# Patient Record
Sex: Female | Born: 1935 | Race: Black or African American | Hispanic: No | Marital: Single | Smoking: Former smoker
Health system: Southern US, Community
[De-identification: ages and names within clinical notes are randomized; demographics above are authoritative.]

## PROBLEM LIST (undated history)

## (undated) DIAGNOSIS — Z531 Procedure and treatment not carried out because of patient's decision for reasons of belief and group pressure: Secondary | ICD-10-CM

## (undated) DIAGNOSIS — E785 Hyperlipidemia, unspecified: Secondary | ICD-10-CM

## (undated) DIAGNOSIS — M1990 Unspecified osteoarthritis, unspecified site: Secondary | ICD-10-CM

## (undated) DIAGNOSIS — Z8719 Personal history of other diseases of the digestive system: Secondary | ICD-10-CM

## (undated) DIAGNOSIS — I639 Cerebral infarction, unspecified: Secondary | ICD-10-CM

## (undated) DIAGNOSIS — IMO0001 Reserved for inherently not codable concepts without codable children: Secondary | ICD-10-CM

## (undated) DIAGNOSIS — K219 Gastro-esophageal reflux disease without esophagitis: Secondary | ICD-10-CM

## (undated) DIAGNOSIS — H409 Unspecified glaucoma: Secondary | ICD-10-CM

## (undated) DIAGNOSIS — I10 Essential (primary) hypertension: Secondary | ICD-10-CM

## (undated) DIAGNOSIS — E119 Type 2 diabetes mellitus without complications: Secondary | ICD-10-CM

## (undated) DIAGNOSIS — Z87442 Personal history of urinary calculi: Secondary | ICD-10-CM

## (undated) DIAGNOSIS — H35039 Hypertensive retinopathy, unspecified eye: Secondary | ICD-10-CM

## (undated) DIAGNOSIS — Z8739 Personal history of other diseases of the musculoskeletal system and connective tissue: Secondary | ICD-10-CM

## (undated) DIAGNOSIS — E11319 Type 2 diabetes mellitus with unspecified diabetic retinopathy without macular edema: Secondary | ICD-10-CM

## (undated) HISTORY — PX: FRACTURE SURGERY: SHX138

## (undated) HISTORY — PX: CATARACT EXTRACTION: SUR2

## (undated) HISTORY — DX: Hypertensive retinopathy, unspecified eye: H35.039

## (undated) HISTORY — PX: UMBILICAL HERNIA REPAIR: SHX196

## (undated) HISTORY — PX: PATELLA FRACTURE SURGERY: SHX735

## (undated) HISTORY — PX: EYE SURGERY: SHX253

## (undated) HISTORY — DX: Unspecified glaucoma: H40.9

## (undated) HISTORY — PX: EXCISIONAL HEMORRHOIDECTOMY: SHX1541

## (undated) HISTORY — PX: HERNIA REPAIR: SHX51

## (undated) HISTORY — PX: CYSTOSCOPY W/ STONE MANIPULATION: SHX1427

## (undated) HISTORY — PX: CHOLECYSTECTOMY: SHX55

## (undated) HISTORY — DX: Type 2 diabetes mellitus with unspecified diabetic retinopathy without macular edema: E11.319

---

## 2006-06-24 IMAGING — CR DG CHEST 2V
2 series · 2 of 2 positions shown · non-contrast
Comparison: none

CLINICAL DATA: Preop ventral hernia repair.  
 CHEST - 2 VIEW:

[view not recorded (1 of 2)]
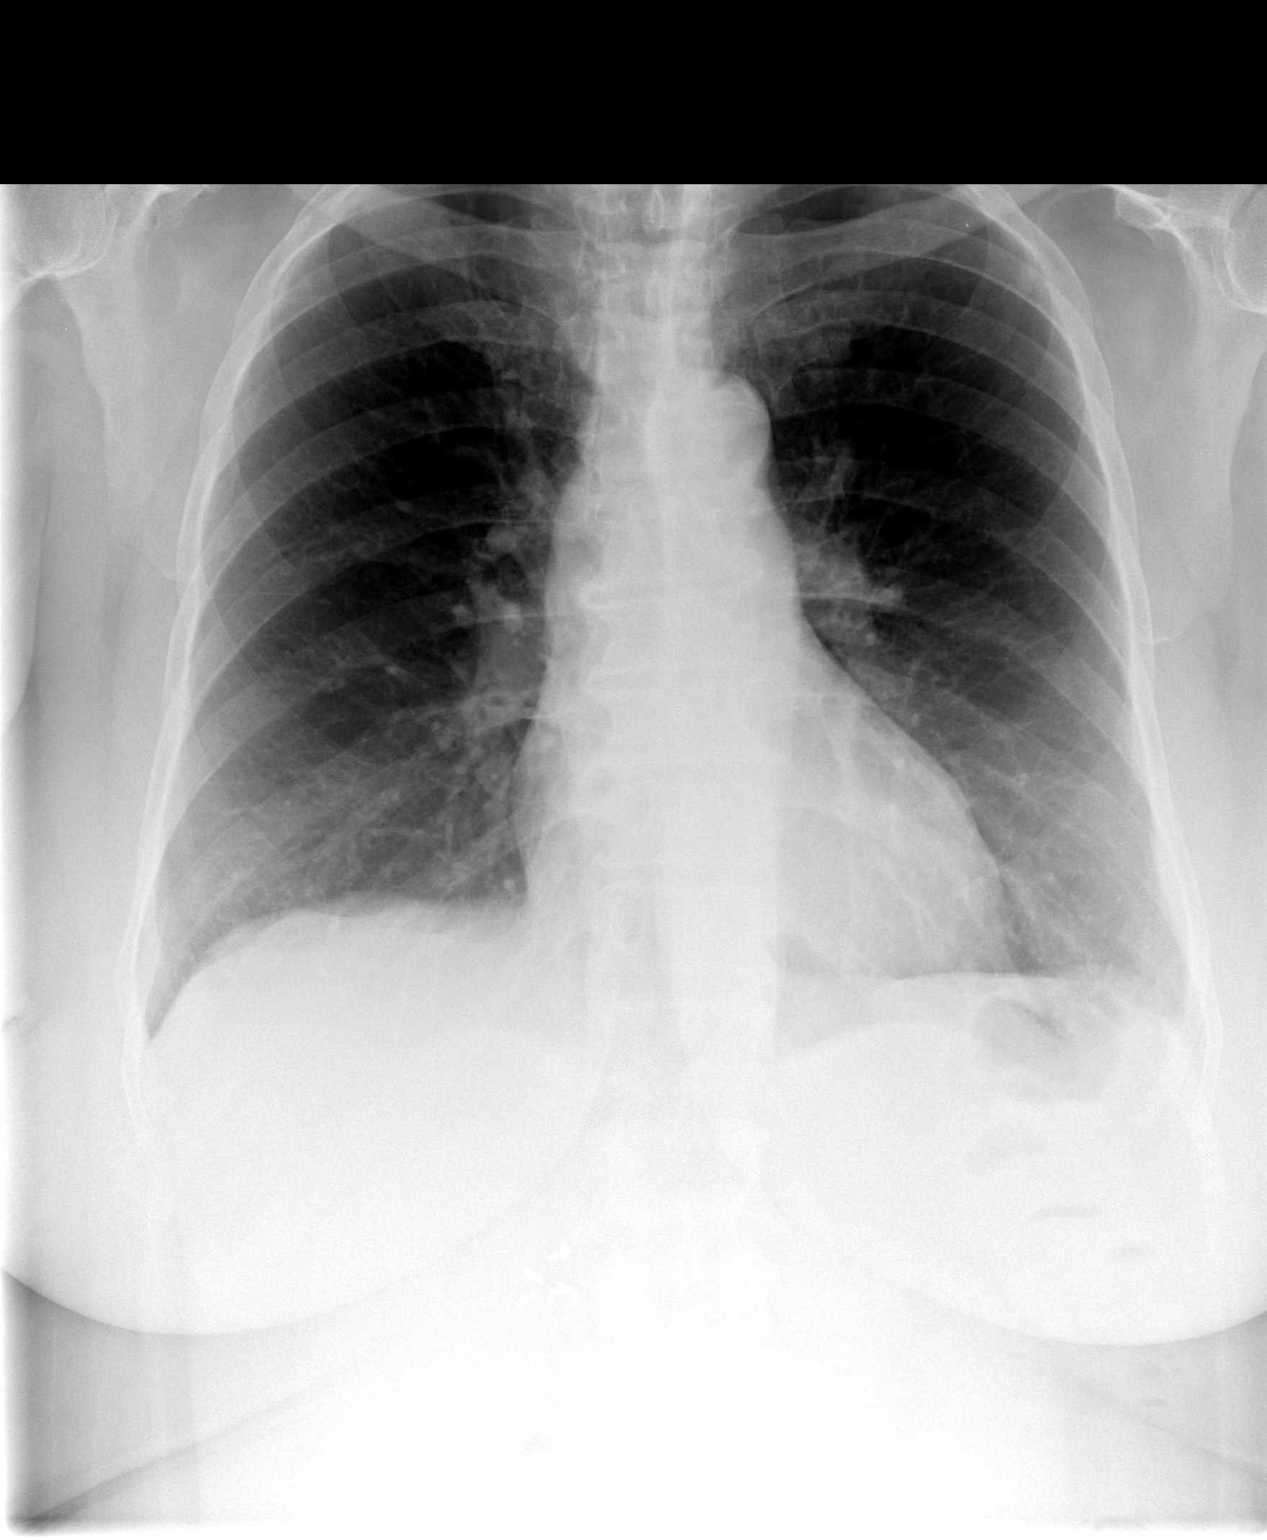

[view not recorded (2 of 2)]
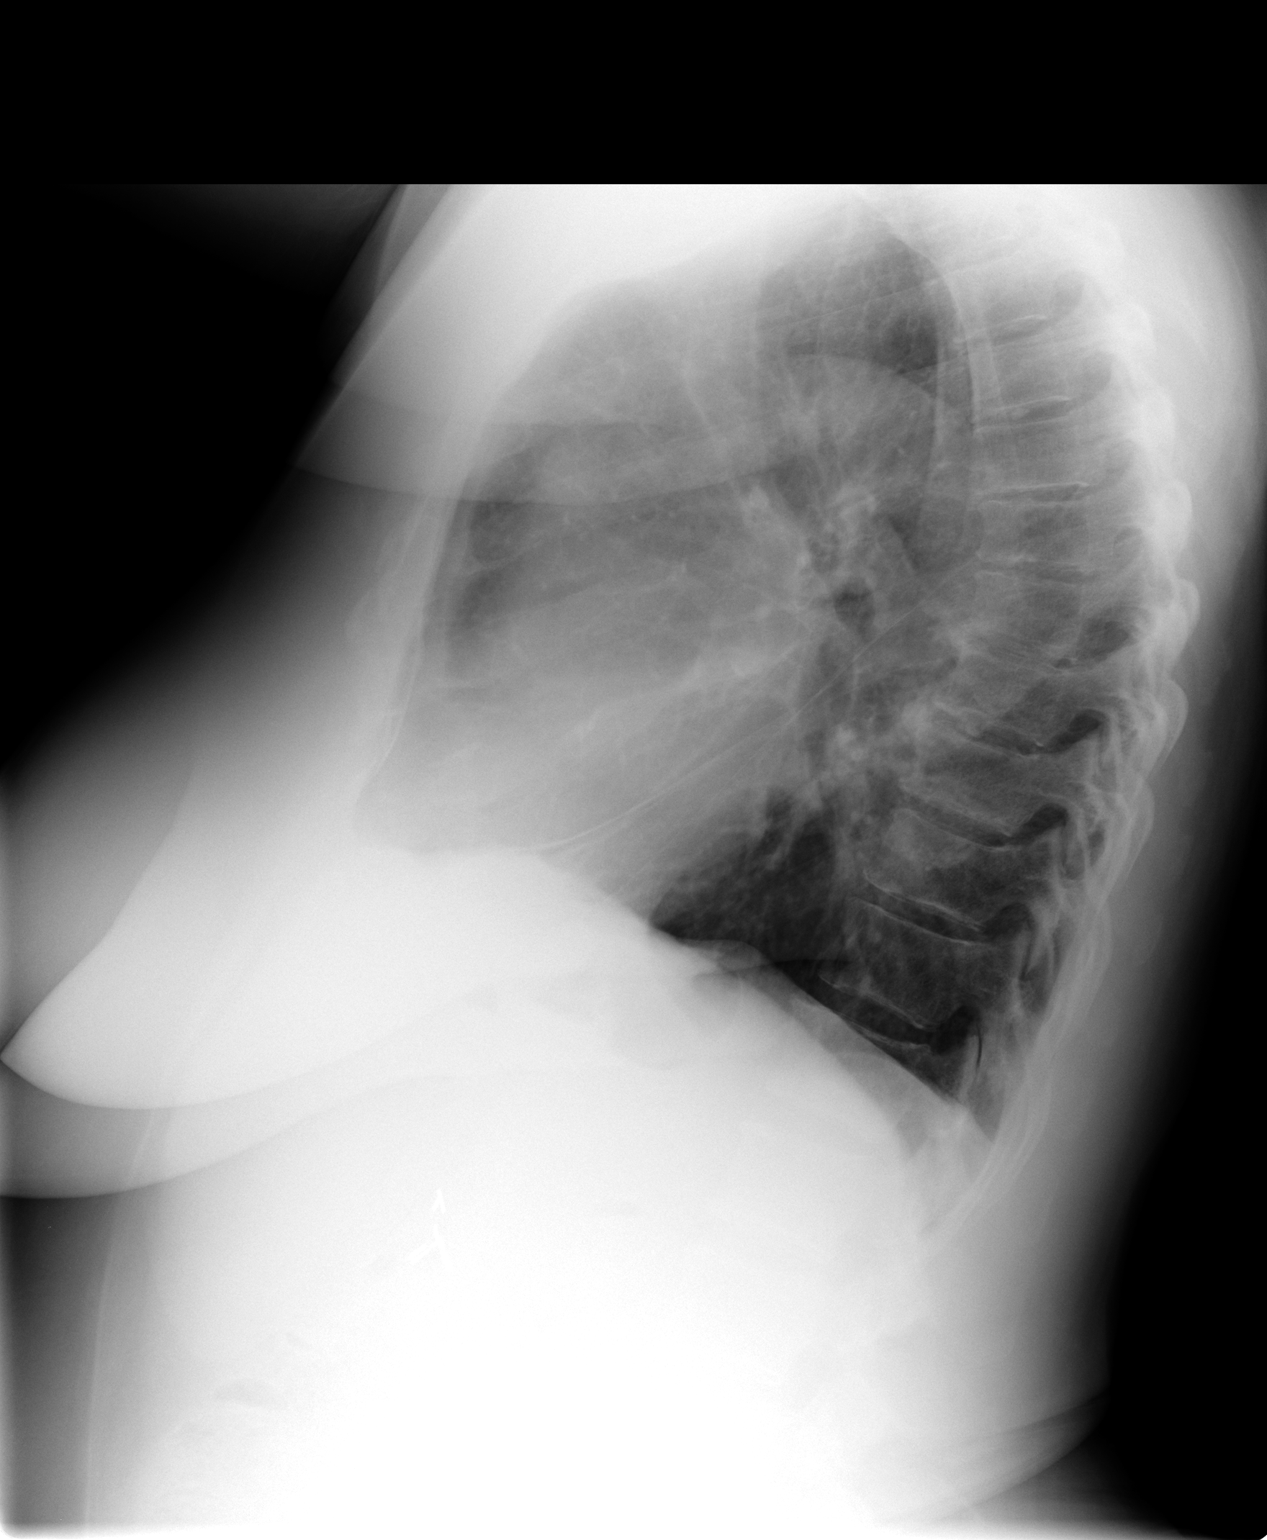

[2 of 2 positions shown; findings below may reference images not displayed]

FINDINGS: The lungs are clear.  The heart and mediastinal structures are normal.
IMPRESSION: No evidence for active chest disease.

## 2006-06-27 ENCOUNTER — Inpatient Hospital Stay (HOSPITAL_COMMUNITY): Admission: RE | Admit: 2006-06-27 | Discharge: 2006-06-30 | Payer: Self-pay | Admitting: General Surgery

## 2006-06-28 IMAGING — CR DG CHEST 2V
2 series · 2 of 2 positions shown · non-contrast
Comparison: none

CLINICAL DATA: Cough and fever

Chest 2 view:
Comparison [DATE]. Heart size upper limits normal. Lungs clear. Vascular
clips in the right upper abdomen. No effusion. Visualized bones unremarkable.

[w chest pa]
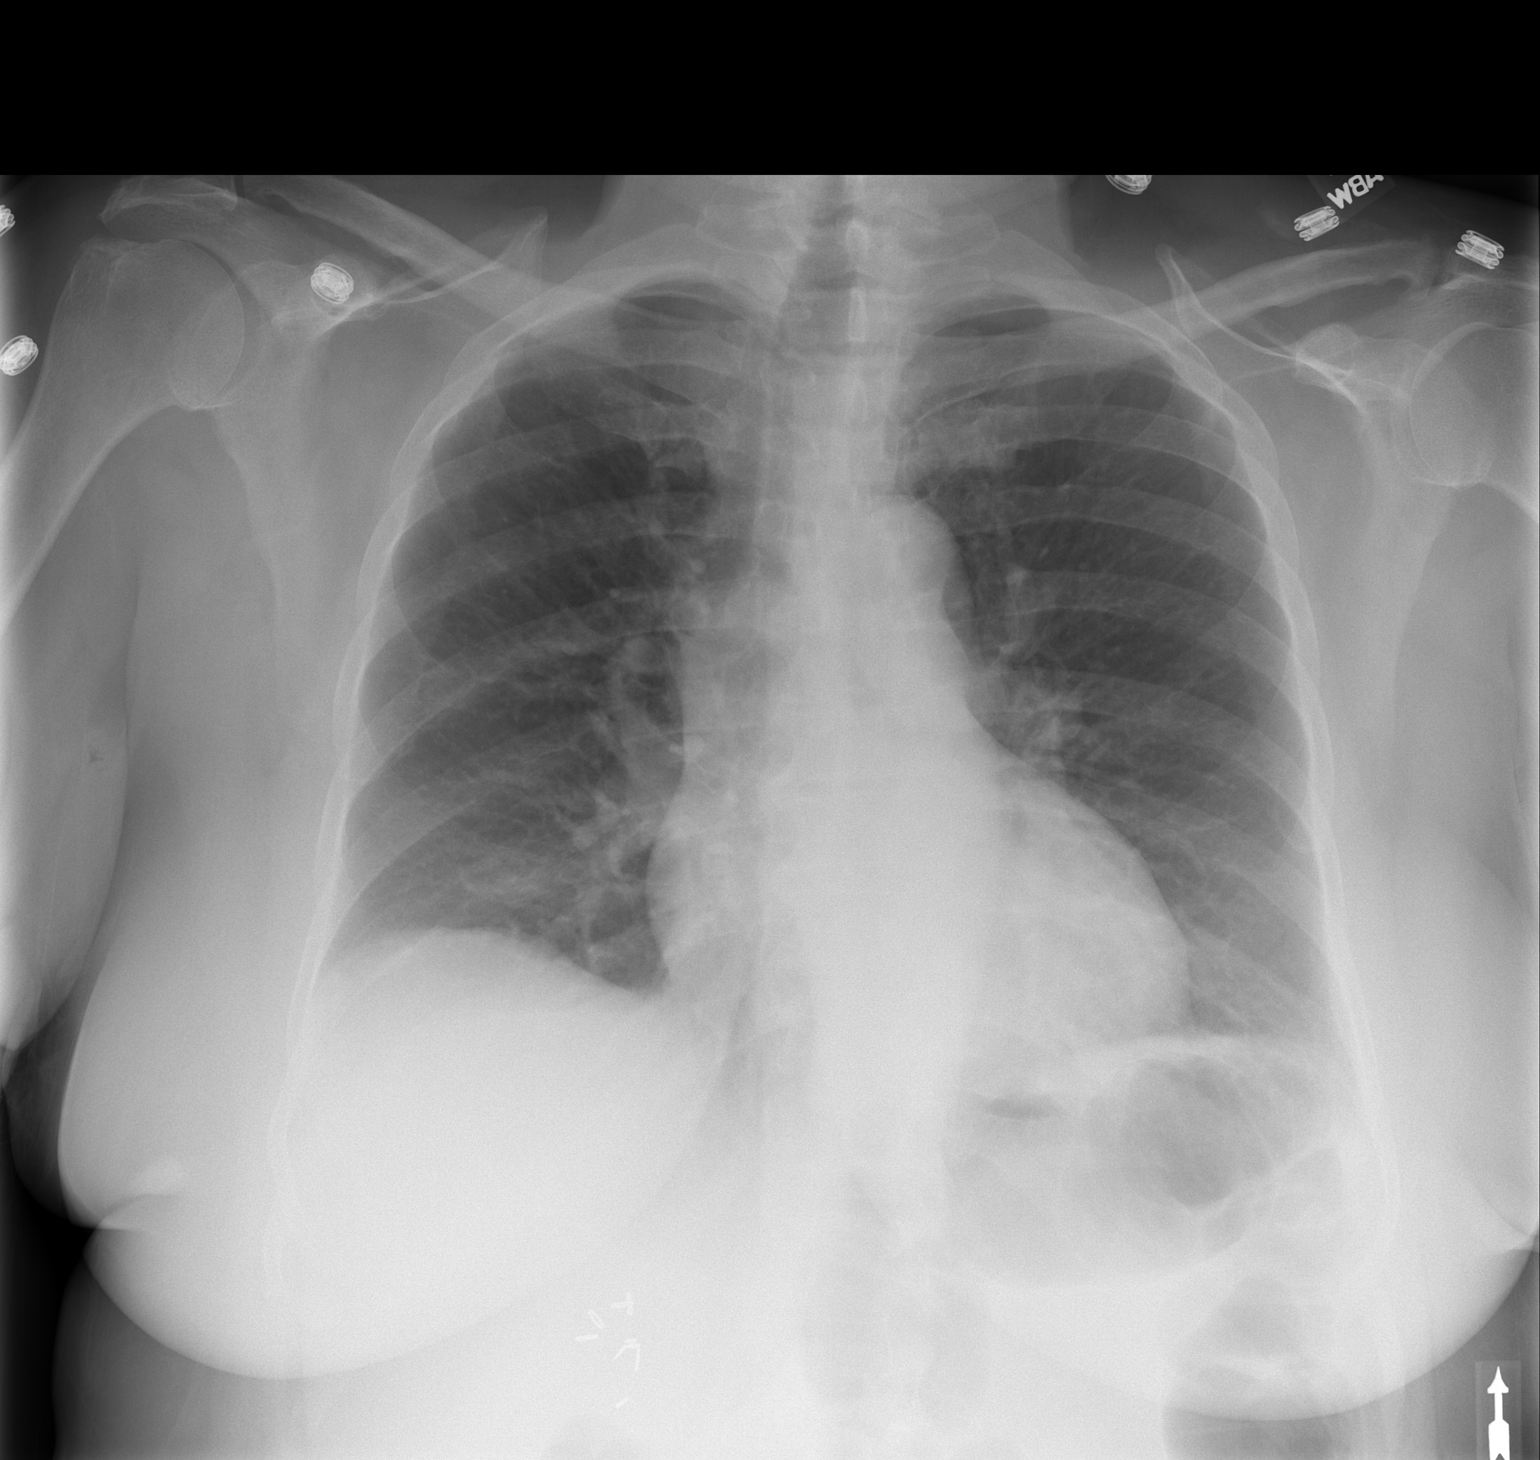

[w chest lat]
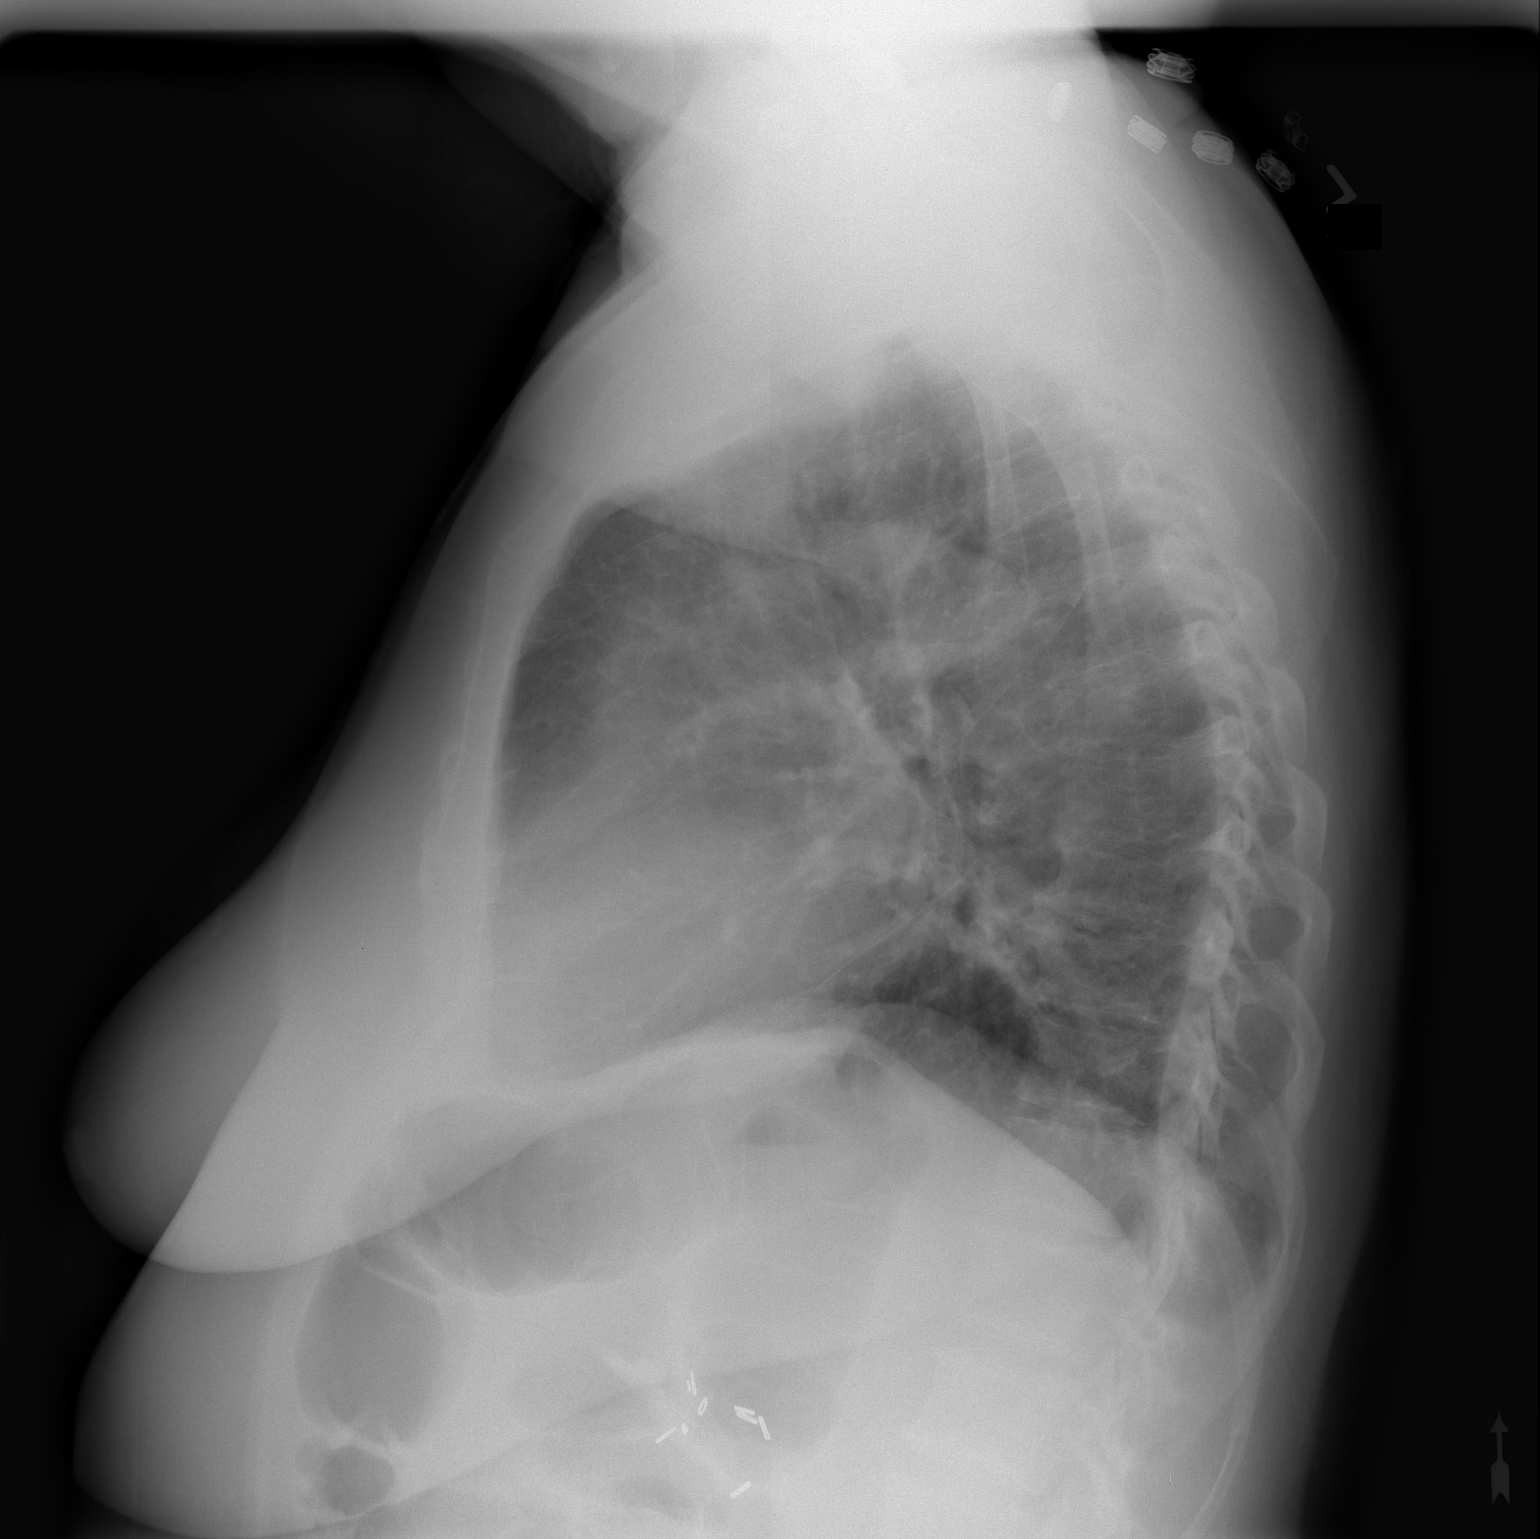

[2 of 2 positions shown; findings below may reference images not displayed]

IMPRESSION: 1. No acute disease

## 2012-02-25 ENCOUNTER — Other Ambulatory Visit: Payer: Self-pay | Admitting: Internal Medicine

## 2012-02-27 ENCOUNTER — Ambulatory Visit (INDEPENDENT_AMBULATORY_CARE_PROVIDER_SITE_OTHER): Payer: BC Managed Care – PPO | Admitting: Family Medicine

## 2012-02-27 VITALS — BP 122/75 | HR 69 | Temp 97.8°F | Resp 16 | Ht 62.25 in | Wt 195.6 lb

## 2012-02-27 DIAGNOSIS — I10 Essential (primary) hypertension: Secondary | ICD-10-CM

## 2012-02-27 DIAGNOSIS — E785 Hyperlipidemia, unspecified: Secondary | ICD-10-CM | POA: Insufficient documentation

## 2012-02-27 DIAGNOSIS — E119 Type 2 diabetes mellitus without complications: Secondary | ICD-10-CM | POA: Insufficient documentation

## 2012-02-27 DIAGNOSIS — M6580 Other synovitis and tenosynovitis, unspecified site: Secondary | ICD-10-CM

## 2012-02-27 DIAGNOSIS — M76892 Other specified enthesopathies of left lower limb, excluding foot: Secondary | ICD-10-CM

## 2012-02-27 MED ORDER — METHYLPREDNISOLONE 4 MG PO TABS: ORAL_TABLET | ORAL | Status: DC

## 2012-02-27 NOTE — Progress Notes (Signed)
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  Has been stiff, using cane at times. Blood sugars < 150  O:  NAD Skin warm and dry Tender medial joint line and soft tissue of left knee with FROM and no crepitus.  Mild effusion.  No lig laxity.  No calf tenderness or thigh tenderness  A: tendonitis  P:  Medrol 4 mg ii daily x 5 days

## 2012-03-14 ENCOUNTER — Ambulatory Visit (INDEPENDENT_AMBULATORY_CARE_PROVIDER_SITE_OTHER): Payer: BC Managed Care – PPO | Admitting: Family Medicine

## 2012-03-14 ENCOUNTER — Ambulatory Visit: Payer: BC Managed Care – PPO

## 2012-03-14 VITALS — BP 105/64 | HR 77 | Temp 98.5°F | Resp 16 | Ht 62.5 in | Wt 193.0 lb

## 2012-03-14 DIAGNOSIS — M25562 Pain in left knee: Secondary | ICD-10-CM

## 2012-03-14 DIAGNOSIS — M25569 Pain in unspecified knee: Secondary | ICD-10-CM

## 2012-03-14 DIAGNOSIS — M1710 Unilateral primary osteoarthritis, unspecified knee: Secondary | ICD-10-CM

## 2012-03-14 IMAGING — CR DG KNEE COMPLETE 4+V*L*
4 series · 4 of 4 positions shown · non-contrast
Comparison: None.

CLINICAL DATA: Pain especially with weightbearing

LEFT KNEE - COMPLETE 4+ VIEW

[lateral]
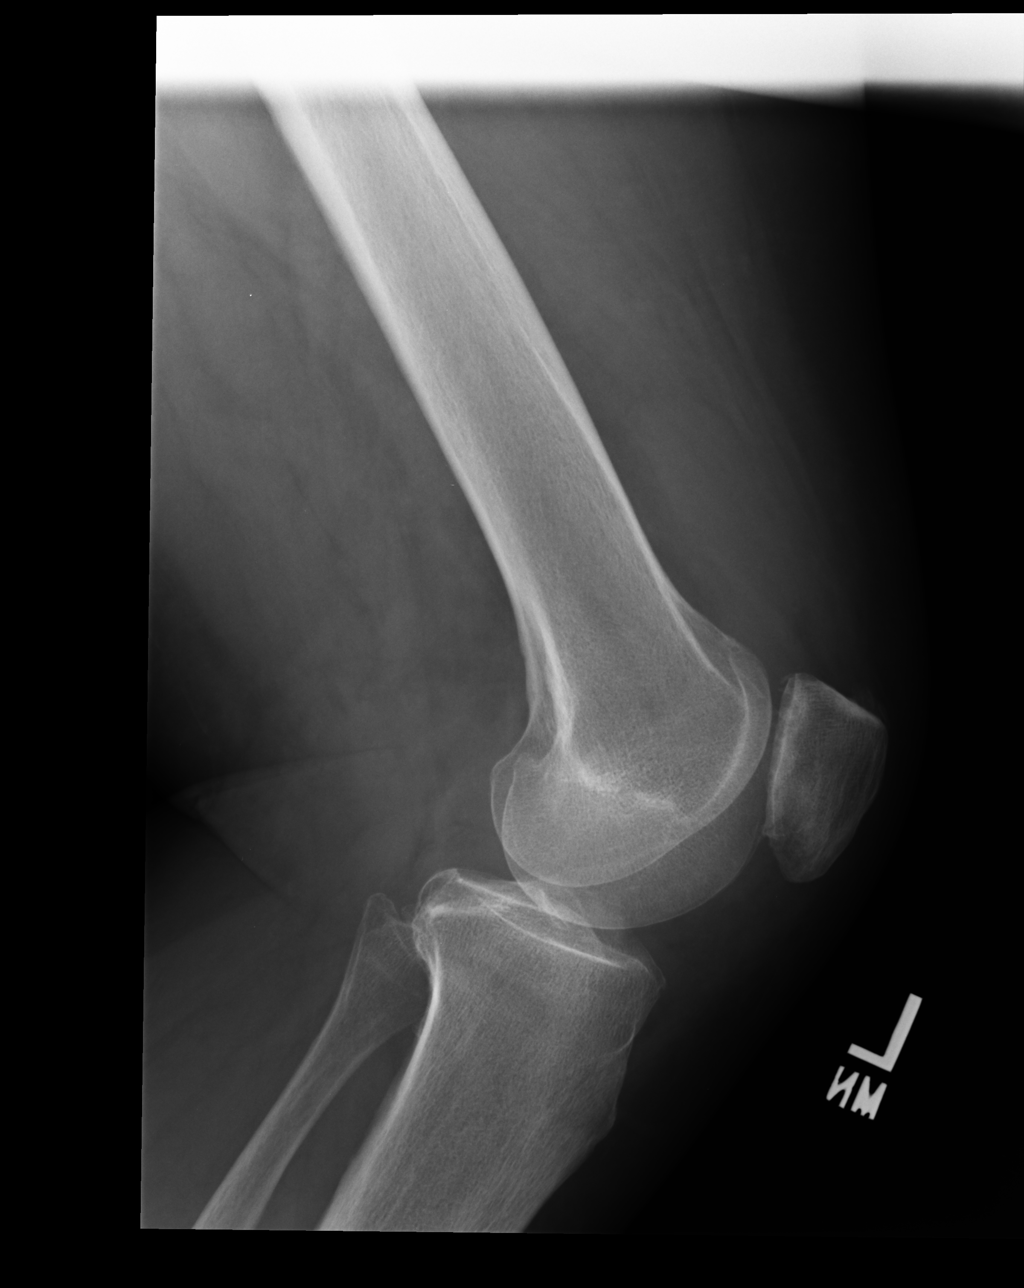

[pa axial]
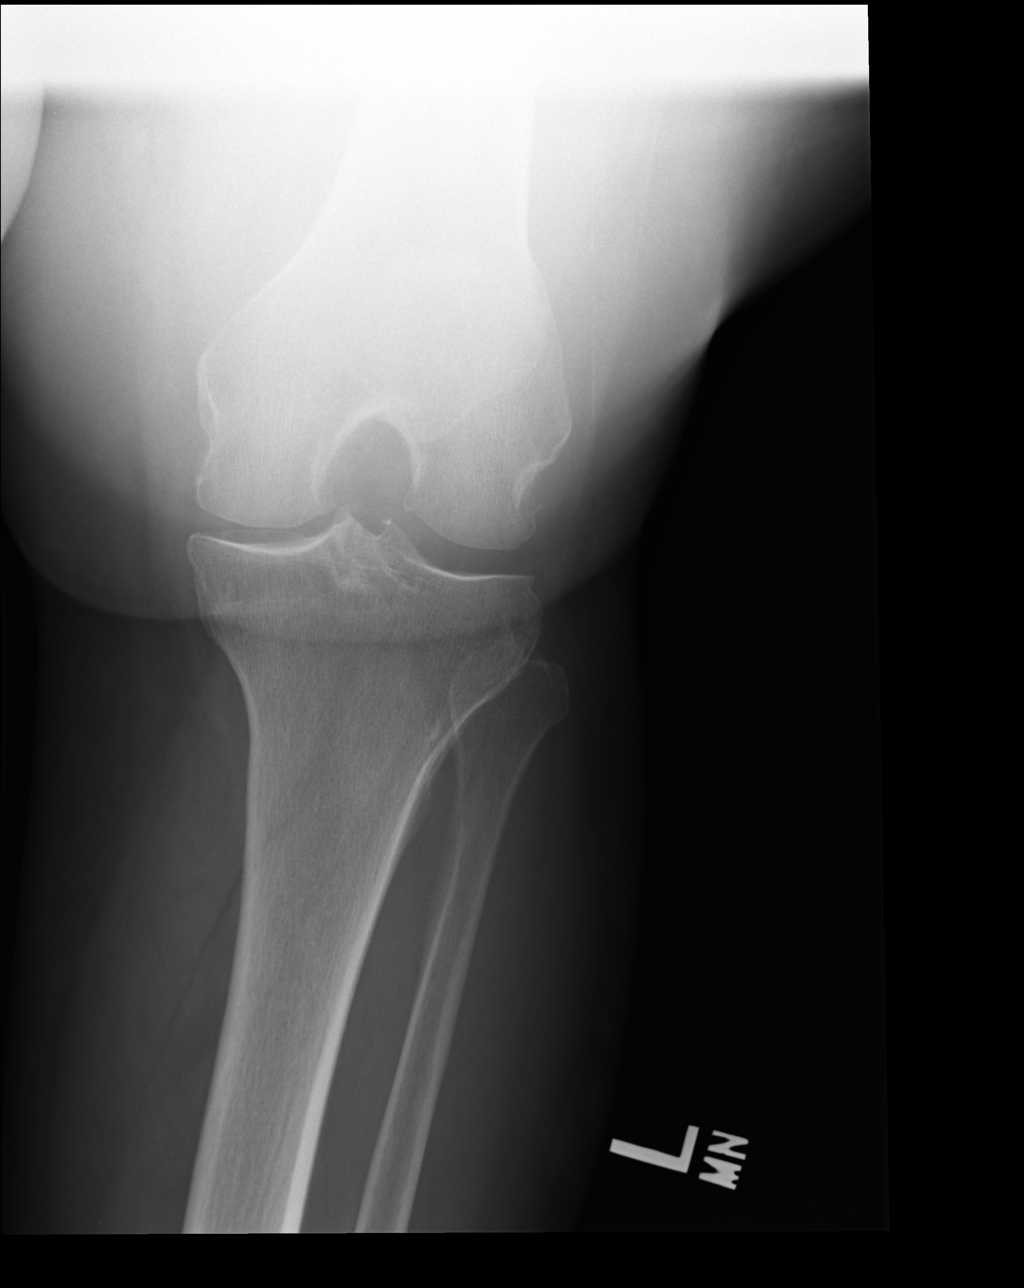

[sunrise]
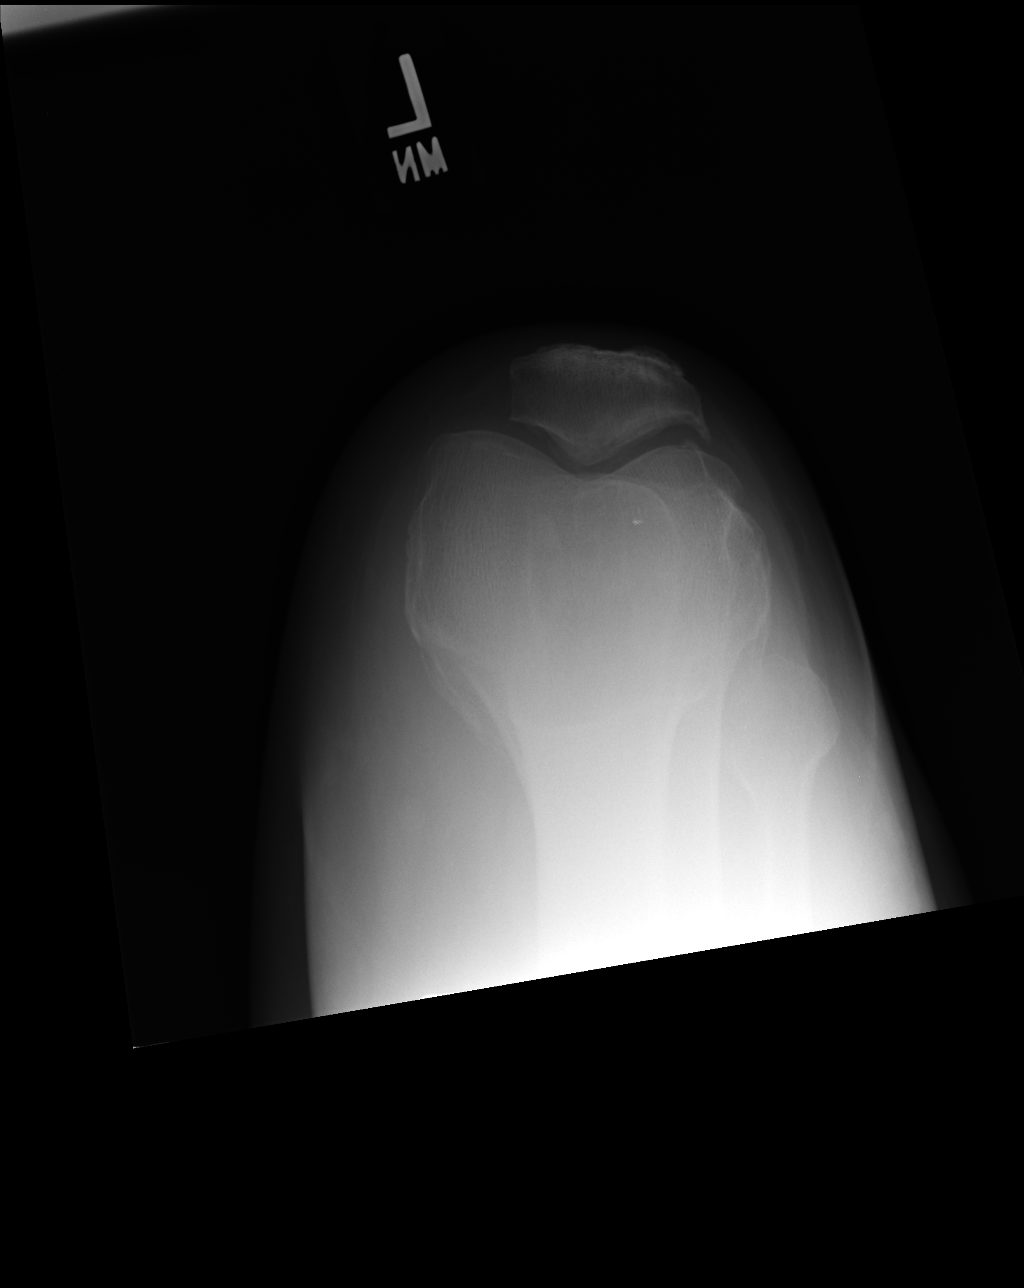

[AP]
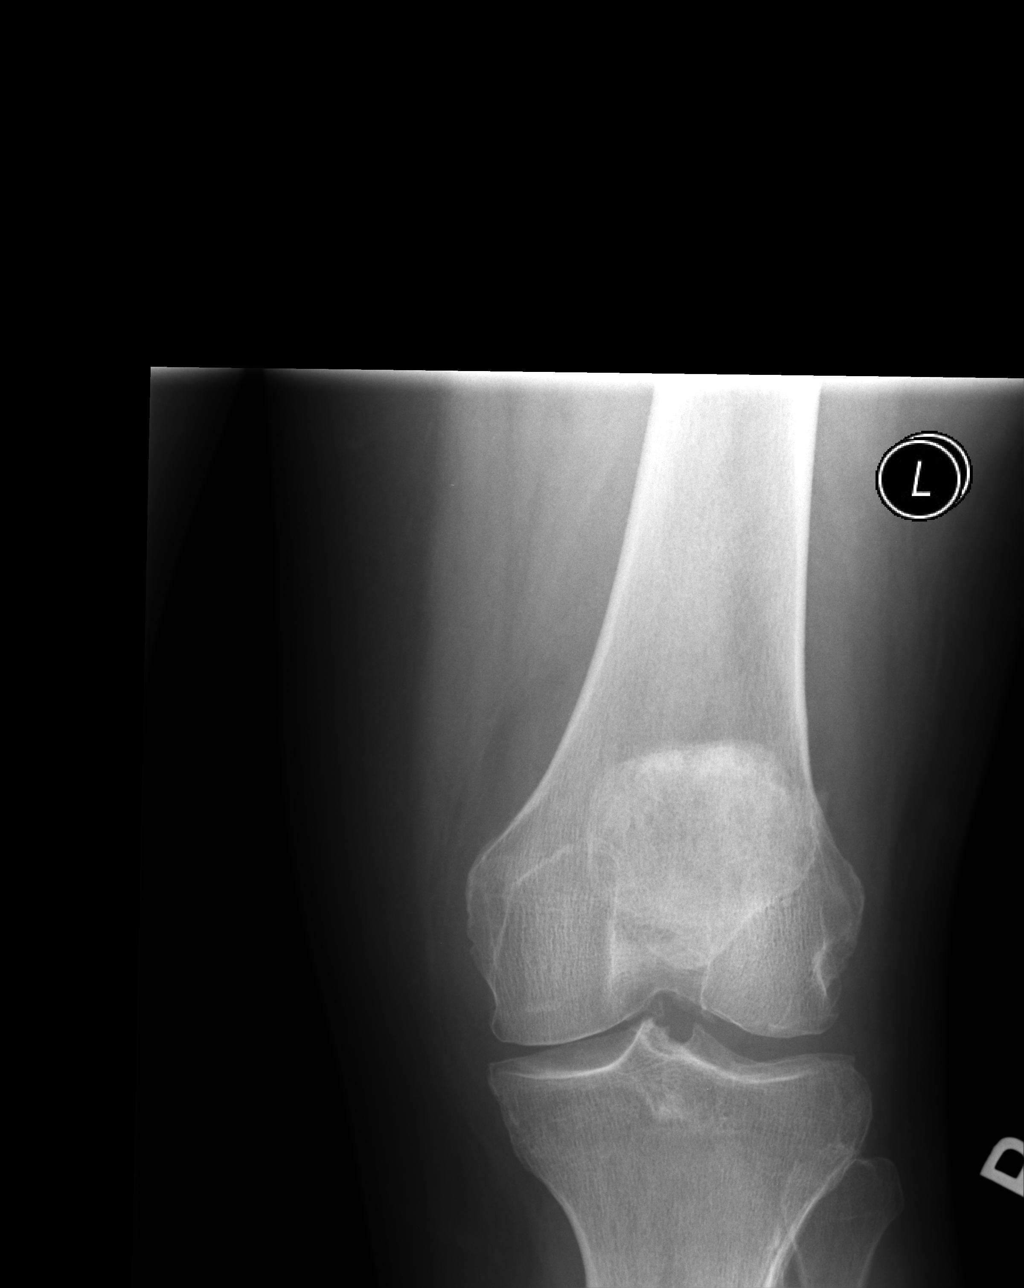

[4 of 4 positions shown; findings below may reference images not displayed]

FINDINGS: There is a small suprapatellar joint effusion.

There is moderate tricompartment osteoarthritis with sharpening
tibial spines, marginal spur formation and medial compartment
narrowing.

No fracture or subluxation.
IMPRESSION: 1.  Moderate tricompartment osteoarthritis.
2.  Joint effusion]

## 2012-03-14 MED ORDER — SITAGLIPTIN PHOS-METFORMIN HCL 50-1000 MG PO TABS: 1.0000 | ORAL_TABLET | Freq: Two times a day (BID) | ORAL | Status: DC

## 2012-03-14 MED ORDER — DICLOFENAC SODIUM 75 MG PO TBEC: 75.0000 mg | DELAYED_RELEASE_TABLET | Freq: Two times a day (BID) | ORAL | Status: AC

## 2012-03-14 NOTE — Progress Notes (Signed)
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 Has been stiff, using cane at times, and tried brace this week. Was seen 10 days ago and given medrol.  She continues to experience the medial left knee pain, especially when walking. Tried OTC NSAID which did help some.  Knee has started to give away at times.  O:  Still tender over medial femoral condyle.  No effustion.  Ligaments intact.  FROM. [facility] reading (PRIMARY) by  Dr. Milus Glazier:  L knee:  Mild arthritic changes  A:  Arthritis, mild with medial inflammation  P:  voltaren 75 mg bid x 10 days Recheck in 10 days

## 2012-03-14 NOTE — Patient Instructions (Addendum)
APPOINTMENT:  March 24, 2012 at 4 pm with Dr. Milus Glazier at [address].    Drinda Butts and Tear Disorders of the Knee (Arthritis, Osteoarthritis) Everyone will experience wear and tear injuries (arthritis, osteoarthritis) of the knee. These are the changes we all get as we age. They come from the joint stress of daily living. The amount of cartilage damage in your knee and your symptoms determine if you need surgery. Mild problems require approximately two months recovery time. More severe problems take several months to recover. With mild problems, your surgeon may find worn and rough cartilage surfaces. With severe changes, your surgeon may find cartilage that has completely worn away and exposed the bone. Loose bodies of bone and cartilage, bone spurs (excess bone growth), and injuries to the menisci (cushions between the large bones of your leg) are also common. All of these problems can cause pain. For a mild wear and tear problem, rough cartilage may simply need to be shaved and smoothed. For more severe problems with areas of exposed bone, your surgeon may use an instrument for roughing up the bone surfaces to stimulate new cartilage growth. Loose bodies are usually removed. Torn menisci may be trimmed or repaired. ABOUT THE ARTHROSCOPIC PROCEDURE Arthroscopy is a surgical technique. It allows your orthopedic surgeon to diagnose and treat your knee injury with accuracy. The surgeon looks into your knee through a small scope. The scope is like a small (pencil-sized) telescope. Arthroscopy is less invasive than open knee surgery. You can expect a more rapid recovery. After the procedure, you will be moved to a recovery area until most of the effects of the medication have worn off. Your caregiver will discuss the test results with you. RECOVERY The severity of the arthritis and the type of procedure performed will determine recovery time. Other important factors include age, physical condition, medical  conditions, and the type of rehabilitation program. Strengthening your muscles after arthroscopy helps guarantee a better recovery. Follow your caregiver's instructions. Use crutches, rest, elevate, ice, and do knee exercises as instructed. Your caregivers will help you and instruct you with exercises and other physical therapy required to regain your mobility, muscle strength, and functioning following surgery. Only take over-the-counter or prescription medicines for pain, discomfort, or fever as directed by your caregiver.  SEEK MEDICAL CARE IF:   There is increased bleeding (more than a small spot) from the wound.   You notice redness, swelling, or increasing pain in the wound.   Pus is coming from wound.   You develop an unexplained oral temperature above 102 F (38.9 C) , or as your caregiver suggests.   You notice a foul smell coming from the wound or dressing.   You have severe pain with motion of the knee.  SEEK IMMEDIATE MEDICAL CARE IF:   You develop a rash.   You have difficulty breathing.   You have any allergic problems.  MAKE SURE YOU:   Understand these instructions.   Will watch your condition.   Will get help right away if you are not doing well or get worse.  Document Released: 11/22/2000 Document Revised: 11/14/2011 Document Reviewed: 04/20/2008 [facility] Patient Information 2012 [place], [place].

## 2012-04-08 HISTORY — PX: KNEE ARTHROSCOPY: SHX127

## 2012-04-13 ENCOUNTER — Encounter (HOSPITAL_BASED_OUTPATIENT_CLINIC_OR_DEPARTMENT_OTHER): Payer: Self-pay | Admitting: *Deleted

## 2012-04-13 NOTE — Progress Notes (Signed)
Patient states she only takes her HTN med when she wakes up with a headache. Only takes her GERD med when she eats something that causes indigestion; does take her DM med, but does not check her blood sugar at home.Instructed patient to take her omeprazole am of surgery and to begin taking her lisinipril daily, starting today. To take am of surgery if taking in the mornings. Instructed to NOT take Janumet. Patient planned to stay at home alone after discharge from [facility]. Informed patient that she must have someone to stay with her overnight after surgery. Instructed patient to come to [facility] on 04/14/12 or 04/15/12 for labs, EKG and CXR. Patient expressed understanding and agreement with all instructions.

## 2012-04-15 ENCOUNTER — Ambulatory Visit
Admission: RE | Admit: 2012-04-15 | Discharge: 2012-04-15 | Disposition: A | Discharge status: Home / Self Care | Payer: Medicare Other | Source: Ambulatory Visit | Attending: Anesthesiology | Admitting: Anesthesiology

## 2012-04-15 ENCOUNTER — Encounter (HOSPITAL_BASED_OUTPATIENT_CLINIC_OR_DEPARTMENT_OTHER)
Admission: RE | Admit: 2012-04-15 | Discharge: 2012-04-15 | Disposition: A | Discharge status: Home / Self Care | Payer: Medicare Other | Source: Ambulatory Visit | Attending: Orthopedic Surgery | Admitting: Orthopedic Surgery

## 2012-04-15 LAB — BASIC METABOLIC PANEL
Calcium: 9 mg/dL (ref 8.4–10.5)
Glucose, Bld: 143 mg/dL — ABNORMAL HIGH (ref 70–99)
Potassium: 3.6 mEq/L (ref 3.5–5.1)

## 2012-04-15 IMAGING — CR DG CHEST 2V
2 series · 2 of 2 positions shown · non-contrast
Comparison: [DATE]

CLINICAL DATA: Hypertension.  Diabetes.  Gastroesophageal reflux.
History of asthma.  Preoperative for knee arthroscopy.

CHEST - 2 VIEW

[view not recorded (1 of 2)]
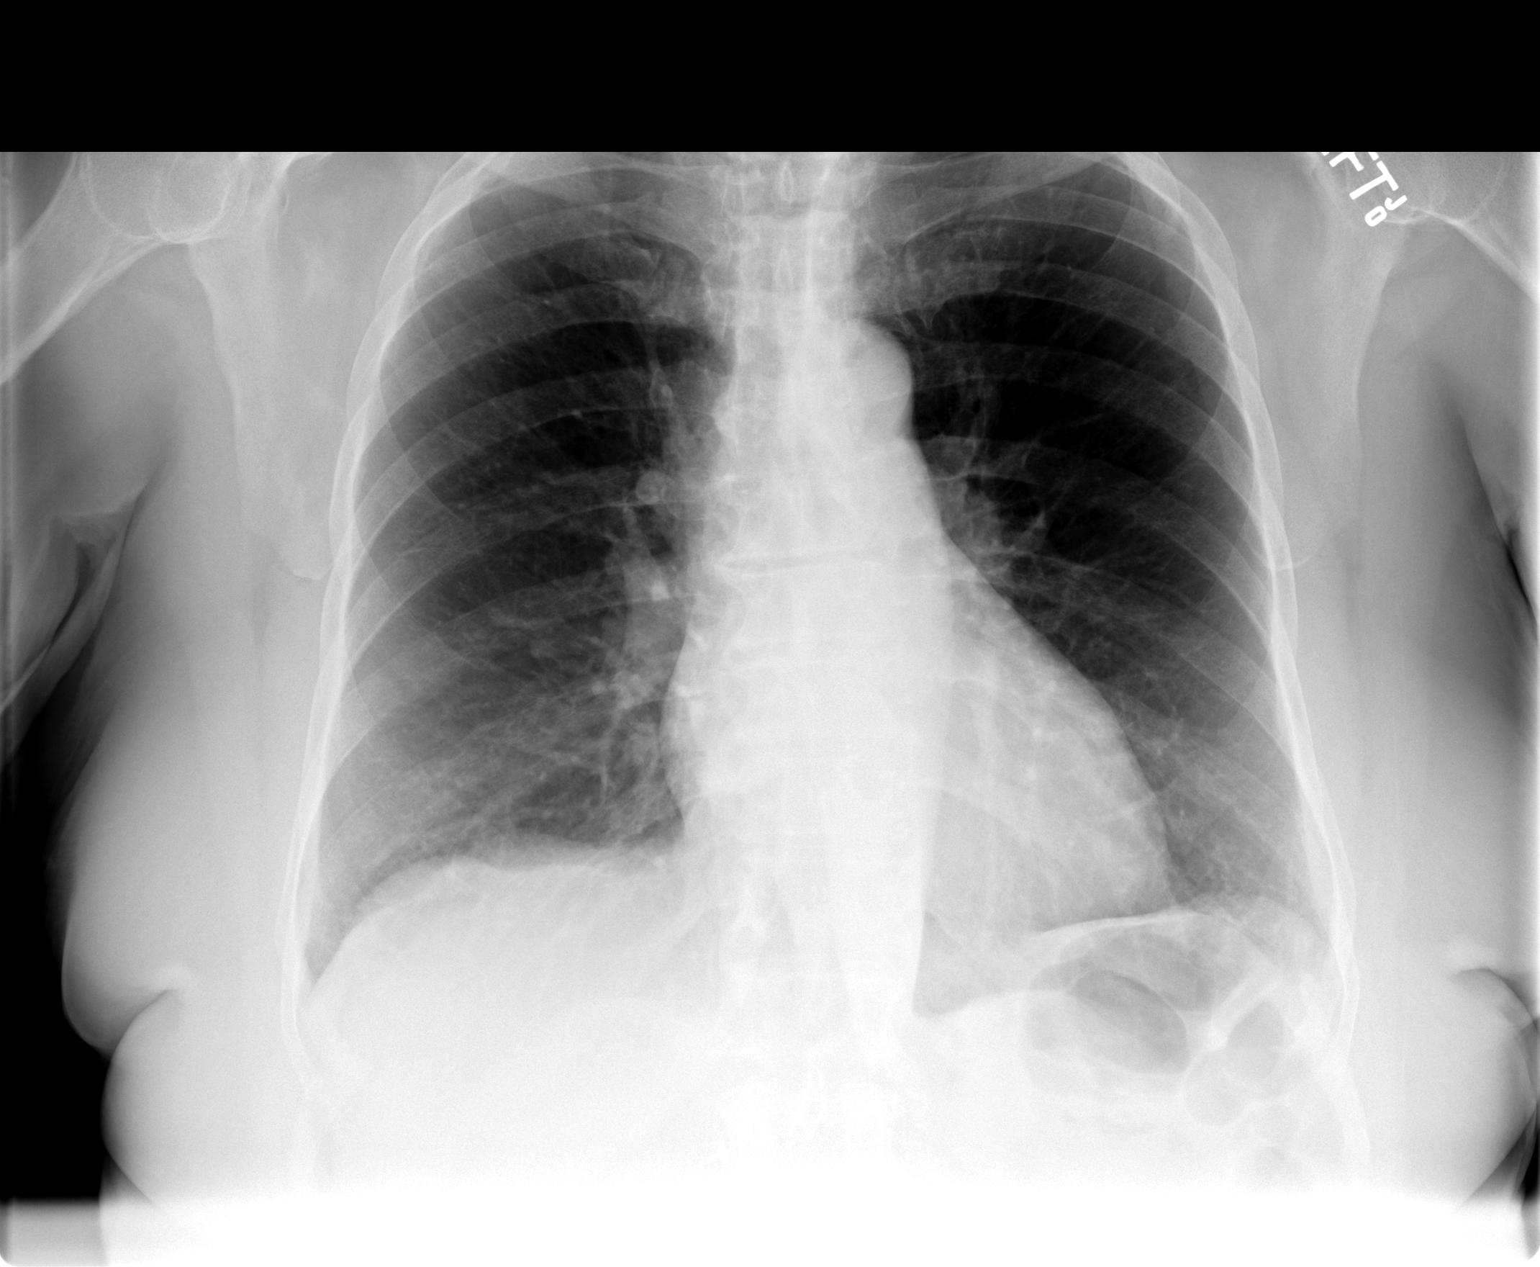

[view not recorded (2 of 2)]
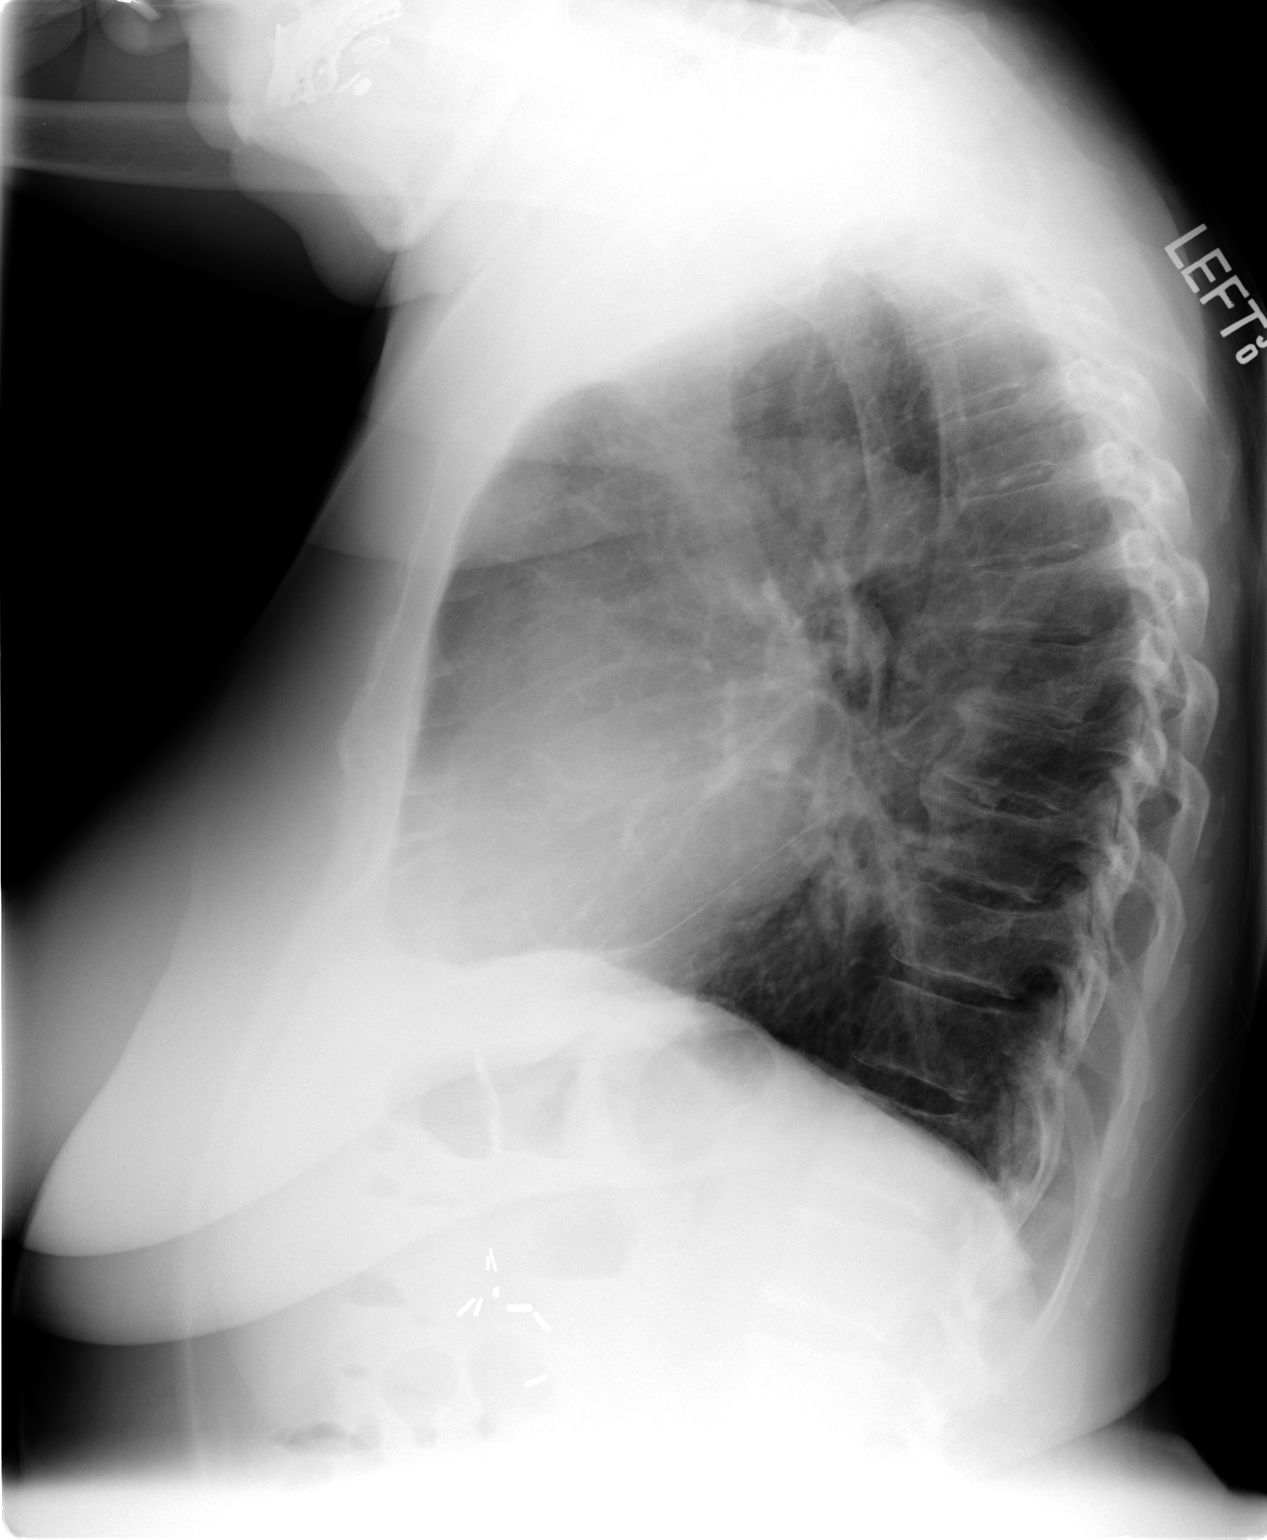

[2 of 2 positions shown; findings below may reference images not displayed]

FINDINGS: Cardiac and mediastinal contours appear normal.

The lungs appear clear.

No pleural effusion is identified.  Mild thoracic spondylosis
noted.
IMPRESSION: 1.  Mild thoracic spondylosis.
2.   Otherwise, no significant abnormality identified.

## 2012-04-15 NOTE — H&P (Signed)
[address]   [address] [place], [place] [address] [phone] A Division of [facility]  Loreta Ave, M.D.     Robert A. Thurston Hole, M.D.     Lunette Stands, M.D. Eulas Post, M.D.    Buford Dresser, M.D. Estell Harpin, M.D. Ralene Cork, D.O.          Genene Churn. Barry Dienes, PA-C            Kirstin A. Shepperson, PA-C [place], OPA-C   RE: Chelsea Gallegos, Chelsea Gallegos                                [phone]      DOB: 09-Mar-1934 INITIAL EVALUATION:  03-27-12 Tyjah is a 78 year-old female, new patient to the office.  Presenting for evaluation of left knee pain.  Somewhat global, a little bit more medial, but her biggest issue is that she gets swelling in the back of the knee.  Any time she is sitting or not active for even a short time she has marked stiffness and difficulty getting going.  Some symptoms on the right, but not nearly as much and more tolerable there.  Really coming in for the left today.  She had gotten non-standing x-rays at [facility] on March 14, 2012 which I have looked at.  On those views there are some tricompartmental changes in the left knee, but again don't look as bad, as they are non-standing films.   Remaining history is reviewed, updated and included in the chart.  She is diabetic and also has cardiovascular disease, as well as high blood pressure.  All of these are under control with medication.    EXAMINATION: General exam is outlined and included in the chart.  This is a healthy appearing 78 year-old in no acute distress.  Height: 5?2.  Weight: 193 pounds.  Antalgic gait, left greater than right.  A little bit of a varus thrust on the left.  Negative log roll, both hips.  Negative straight leg raise, both sides.  Both knees have full extension, more than 115 degrees of flexion.  Definitely more varus thrust in the right asymptomatic knee compared to the left.  More patellofemoral crepitus in the right asymptomatic knee compared to  the left.  On the left point tender medial joint line.  Positive medial McMurray's.  2+ reactive effusion.  Neurovascularly intact distally.  Stable ligaments both sides.    X-RAYS: I got a standing four view x-ray of both knees today.  Tricompartmental changes on both sides, but much more advanced in the right lesser symptomatic knee where she is just about down to bone on bone medial compartment.  Patellofemoral joint some changes, but again right greater than left.  On the left on standing views she actually has reasonable maintenance of medial and lateral compartments   DISPOSITION:  Degenerative arthritis, both knees. Worse on the right where she has little to no symptoms. 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  Definitely has a reactive effusion.  Discussed this with her.  We are going to try intraarticular aspiration and injection of the symptomatic left knee to see if we can settle things down.  If this is ineffective she is to call, I will get an MRI to look for a meniscus tear, which would lend itself to treatment with arthroscopic debridement.  If the shot  is effective on the left, nothing further at this time, repeating in the future as necessary.  I have also described the amount of arthritis she has on the right, but I would tailor further treatment there depending on symptoms.  She understands.  PROCEDURE NOTE: The patient's clinical condition is marked by substantial pain and/or significant functional disability.  Other conservative therapy has not provided relief, is contraindicated, or not appropriate.  There is a reasonable likelihood that injection will significantly improve the patient's pain and/or functional disability. After appropriate consent and under sterile technique intraarticular injection of the left knee with Depo-Medrol/Marcaine.    Loreta Ave, M.D. Electronically verified by Loreta Ave, M.D. DFM:jjh Cc: Dr. Elvina Sidle, fax: [phone] Cc: Dr. Janace Hoard, fax: [phone] D 03-27-12, T 03-28-12  [facility] [address]   [address] [place],  [address] [phone] A Division of [facility]  Loreta Ave, M.D.     Robert A. Thurston Hole, M.D.     Lunette Stands, M.D. Eulas Post, M.D.    Buford Dresser, M.D. Estell Harpin, M.D. Ralene Cork, D.O.          Genene Churn. Barry Dienes, PA-C            Kirstin A. Shepperson, PA-C [place], OPA-C   RE: Sarahgrace, Brom[place]   [phone]      DOB: [birth year] PROGRESS NOTE: 04-10-12 Elesha returns for follow-up. Continued symptoms in her left knee. Recent MRI completed. She comes in for follow-up. Very healthy 78 year old with progressive increasing pain left knee. Some symptoms on the right which became tolerable. Left knee intolerable. She gets intermittent mechanical symptoms in the knee. Aching and swelling at the end of the day. No specific history of trauma. Insidious onset. She's gotten an interarticular injection which helped with Marcaine in place but not much afterwards. Standing x-rays on 4/19 revealed that she has more degenerative changes in the right knee where she's almost bone on bone medial compartment. On the left she has relatively well preserved joint spaces medial and lateral compartments. Some changes patellofemoral both sides right worse than left. Recent MRI scan complete and reviewed. This shows a lot of reactive edema in the medial femoral condyle with a little sclerosis at the bottom. They raised a question of stress fracture but her history doesn't go along with that. She has a considerable medial meniscus tear. I went over the x-rays MRI report and scan it self. She's not following the course of AVN or stress fracture medial femoral condyle and is having a lot of intraarticular mechanical symptoms. I told her I'm concerned that what I'm seeing on MRI could be a  picture of collapse and further degenerative arthritis in the medial compartment with AVN medial femoral condyle. At this point in time nothing we have done has helped her. The other option we have is assessing debriding arthroscopy for meniscus tear to help with mechanical symptoms and then to follow her. If that helps considerably would do nothing further. I have cautioned her about changes on MRI and x-rays and my concerned about arthritis in the future. I think she has a realistic outlook on what we're trying to achieve. More than 25 minutes spent covering this. All questions answered. Discussed risks benefits and possible complications in detail. I'll see her at the time of operative intervention.  Loreta Ave, M.D.  Electronically verified by Loreta Ave, M.D. DFM:kh D 04-10-12  T 04-13-12

## 2012-04-16 ENCOUNTER — Ambulatory Visit (HOSPITAL_BASED_OUTPATIENT_CLINIC_OR_DEPARTMENT_OTHER)
Admission: RE | Admit: 2012-04-16 | Discharge: 2012-04-16 | Disposition: A | Discharge status: Home / Self Care | Payer: Medicare Other | Source: Ambulatory Visit | Attending: Orthopedic Surgery | Admitting: Orthopedic Surgery

## 2012-04-16 ENCOUNTER — Encounter (HOSPITAL_BASED_OUTPATIENT_CLINIC_OR_DEPARTMENT_OTHER): Payer: Self-pay | Admitting: Anesthesiology

## 2012-04-16 ENCOUNTER — Encounter (HOSPITAL_BASED_OUTPATIENT_CLINIC_OR_DEPARTMENT_OTHER)
Admission: RE | Disposition: A | Discharge status: Home / Self Care | Payer: Self-pay | Source: Ambulatory Visit | Attending: Orthopedic Surgery

## 2012-04-16 ENCOUNTER — Encounter (HOSPITAL_BASED_OUTPATIENT_CLINIC_OR_DEPARTMENT_OTHER): Payer: Self-pay | Admitting: *Deleted

## 2012-04-16 ENCOUNTER — Ambulatory Visit (HOSPITAL_BASED_OUTPATIENT_CLINIC_OR_DEPARTMENT_OTHER): Payer: Medicare Other | Admitting: Anesthesiology

## 2012-04-16 DIAGNOSIS — I10 Essential (primary) hypertension: Secondary | ICD-10-CM | POA: Insufficient documentation

## 2012-04-16 DIAGNOSIS — J45909 Unspecified asthma, uncomplicated: Secondary | ICD-10-CM | POA: Insufficient documentation

## 2012-04-16 DIAGNOSIS — E119 Type 2 diabetes mellitus without complications: Secondary | ICD-10-CM | POA: Insufficient documentation

## 2012-04-16 DIAGNOSIS — Z01810 Encounter for preprocedural cardiovascular examination: Secondary | ICD-10-CM | POA: Insufficient documentation

## 2012-04-16 DIAGNOSIS — M23305 Other meniscus derangements, unspecified medial meniscus, unspecified knee: Secondary | ICD-10-CM | POA: Insufficient documentation

## 2012-04-16 DIAGNOSIS — K219 Gastro-esophageal reflux disease without esophagitis: Secondary | ICD-10-CM | POA: Insufficient documentation

## 2012-04-16 DIAGNOSIS — M23359 Other meniscus derangements, posterior horn of lateral meniscus, unspecified knee: Secondary | ICD-10-CM | POA: Insufficient documentation

## 2012-04-16 DIAGNOSIS — M2340 Loose body in knee, unspecified knee: Secondary | ICD-10-CM | POA: Insufficient documentation

## 2012-04-16 DIAGNOSIS — M1710 Unilateral primary osteoarthritis, unspecified knee: Secondary | ICD-10-CM | POA: Insufficient documentation

## 2012-04-16 DIAGNOSIS — Z87312 Personal history of (healed) stress fracture: Secondary | ICD-10-CM | POA: Insufficient documentation

## 2012-04-16 DIAGNOSIS — Z4789 Encounter for other orthopedic aftercare: Secondary | ICD-10-CM

## 2012-04-16 HISTORY — DX: Essential (primary) hypertension: I10

## 2012-04-16 HISTORY — DX: Unspecified osteoarthritis, unspecified site: M19.90

## 2012-04-16 HISTORY — DX: Hyperlipidemia, unspecified: E78.5

## 2012-04-16 HISTORY — DX: Gastro-esophageal reflux disease without esophagitis: K21.9

## 2012-04-16 LAB — POCT HEMOGLOBIN-HEMACUE: Hemoglobin: 10.1 g/dL — ABNORMAL LOW (ref 12.0–15.0)

## 2012-04-16 LAB — GLUCOSE, CAPILLARY: Glucose-Capillary: 153 mg/dL — ABNORMAL HIGH (ref 70–99)

## 2012-04-16 SURGERY — ARTHROSCOPY, KNEE, WITH MEDIAL MENISCECTOMY
Anesthesia: Choice | Site: Knee | Laterality: Left | Wound class: Clean

## 2012-04-16 MED ORDER — FENTANYL CITRATE 0.05 MG/ML IJ SOLN: 50.0000 ug | INTRAMUSCULAR | Status: DC | PRN

## 2012-04-16 MED ORDER — HYDROMORPHONE HCL PF 1 MG/ML IJ SOLN
0.2500 mg | INTRAMUSCULAR | Status: DC | PRN
  Administered 2012-04-16: 0.5 mg via INTRAVENOUS

## 2012-04-16 MED ORDER — CEFAZOLIN SODIUM-DEXTROSE 2-3 GM-% IV SOLR
2.0000 g | INTRAVENOUS | Status: AC
  Administered 2012-04-16: 2 g via INTRAVENOUS

## 2012-04-16 MED ORDER — PROPOFOL 10 MG/ML IV EMUL
INTRAVENOUS | Status: DC | PRN
  Administered 2012-04-16: 200 mg via INTRAVENOUS

## 2012-04-16 MED ORDER — MIDAZOLAM HCL 5 MG/5ML IJ SOLN
INTRAMUSCULAR | Status: DC | PRN
  Administered 2012-04-16: .5 mg via INTRAVENOUS

## 2012-04-16 MED ORDER — FENTANYL CITRATE 0.05 MG/ML IJ SOLN
50.0000 ug | INTRAMUSCULAR | Status: DC | PRN
  Administered 2012-04-16: 100 ug via INTRAVENOUS

## 2012-04-16 MED ORDER — ONDANSETRON HCL 4 MG/2ML IJ SOLN: 4.0000 mg | Freq: Four times a day (QID) | INTRAMUSCULAR | Status: DC | PRN

## 2012-04-16 MED ORDER — LACTATED RINGERS IV SOLN
INTRAVENOUS | Status: DC
  Administered 2012-04-16 (×2): via INTRAVENOUS

## 2012-04-16 MED ORDER — LIDOCAINE HCL (CARDIAC) 20 MG/ML IV SOLN
INTRAVENOUS | Status: DC | PRN
  Administered 2012-04-16: 75 mg via INTRAVENOUS

## 2012-04-16 MED ORDER — METHYLPREDNISOLONE ACETATE 80 MG/ML IJ SUSP
INTRAMUSCULAR | Status: DC | PRN
  Administered 2012-04-16: 10:00:00 via INTRA_ARTICULAR

## 2012-04-16 MED ORDER — EPHEDRINE SULFATE 50 MG/ML IJ SOLN
INTRAMUSCULAR | Status: DC | PRN
  Administered 2012-04-16: 10 mg via INTRAVENOUS

## 2012-04-16 MED ORDER — SODIUM CHLORIDE 0.9 % IV SOLN: INTRAVENOUS | Status: DC

## 2012-04-16 MED ORDER — SODIUM CHLORIDE 0.9 % IR SOLN
Status: DC | PRN
  Administered 2012-04-16: 6000 mL

## 2012-04-16 MED ORDER — ONDANSETRON HCL 4 MG/2ML IJ SOLN
INTRAMUSCULAR | Status: DC | PRN
  Administered 2012-04-16 (×2): 4 mg via INTRAVENOUS

## 2012-04-16 SURGICAL SUPPLY — 39 items
BANDAGE ELASTIC 6 VELCRO ST LF (GAUZE/BANDAGES/DRESSINGS) ×2 IMPLANT
BLADE CUDA 5.5 (BLADE) IMPLANT
BLADE CUDA GRT WHITE 3.5 (BLADE) ×1 IMPLANT
BLADE CUTTER GATOR 3.5 (BLADE) ×2 IMPLANT
BLADE CUTTER MENIS 5.5 (BLADE) IMPLANT
BLADE GREAT WHITE 4.2 (BLADE) ×3 IMPLANT
BUR OVAL 4.0 (BURR) IMPLANT
CANISTER OMNI JUG 16 LITER (MISCELLANEOUS) ×2 IMPLANT
CANISTER SUCTION 2500CC (MISCELLANEOUS) IMPLANT
CLOTH BEACON ORANGE TIMEOUT ST (SAFETY) ×2 IMPLANT
CUTTER MENISCUS  4.2MM (BLADE)
CUTTER MENISCUS 4.2MM (BLADE) IMPLANT
DRAPE ARTHROSCOPY W/POUCH 90 (DRAPES) ×2 IMPLANT
DURAPREP 26ML APPLICATOR (WOUND CARE) ×2 IMPLANT
ELECT MENISCUS 165MM 90D (ELECTRODE) IMPLANT
ELECT REM PT RETURN 9FT ADLT (ELECTROSURGICAL)
ELECTRODE REM PT RTRN 9FT ADLT (ELECTROSURGICAL) IMPLANT
GAUZE XEROFORM 1X8 LF (GAUZE/BANDAGES/DRESSINGS) ×2 IMPLANT
GLOVE BIO SURGEON STRL SZ7 (GLOVE) ×2 IMPLANT
GLOVE BIO SURGEON STRL SZ8.5 (GLOVE) ×1 IMPLANT
GLOVE BIOGEL PI IND STRL 7.0 (GLOVE) IMPLANT
GLOVE BIOGEL PI IND STRL 8 (GLOVE) ×1 IMPLANT
GLOVE BIOGEL PI INDICATOR 7.0 (GLOVE) ×1
GLOVE BIOGEL PI INDICATOR 8 (GLOVE) ×1
GLOVE ORTHO TXT STRL SZ7.5 (GLOVE) ×4 IMPLANT
GOWN BRE IMP PREV XXLGXLNG (GOWN DISPOSABLE) ×2 IMPLANT
GOWN PREVENTION PLUS XLARGE (GOWN DISPOSABLE) ×3 IMPLANT
HOLDER KNEE FOAM BLUE (MISCELLANEOUS) ×2 IMPLANT
KNEE WRAP E Z 3 GEL PACK (MISCELLANEOUS) ×1 IMPLANT
PACK ARTHROSCOPY DSU (CUSTOM PROCEDURE TRAY) ×2 IMPLANT
PACK BASIN DAY SURGERY FS (CUSTOM PROCEDURE TRAY) ×2 IMPLANT
PENCIL BUTTON HOLSTER BLD 10FT (ELECTRODE) IMPLANT
SET ARTHROSCOPY TUBING (MISCELLANEOUS) ×2
SET ARTHROSCOPY TUBING LN (MISCELLANEOUS) ×1 IMPLANT
SPONGE GAUZE 4X4 12PLY (GAUZE/BANDAGES/DRESSINGS) ×4 IMPLANT
SUT ETHILON 3 0 PS 1 (SUTURE) ×2 IMPLANT
SUT VIC AB 3-0 FS2 27 (SUTURE) IMPLANT
TOWEL OR 17X24 6PK STRL BLUE (TOWEL DISPOSABLE) ×2 IMPLANT
WATER STERILE IRR 1000ML POUR (IV SOLUTION) ×2 IMPLANT

## 2012-04-16 NOTE — Discharge Instructions (Addendum)
°  Post Anesthesia Home Care Instructions ° °Activity: °Get plenty of rest for the remainder of the day. A responsible adult should stay with you for 24 hours following the procedure.  °For the next 24 hours, DO NOT: °-Drive a car °-Operate machinery °-Drink alcoholic beverages °-Take any medication unless instructed by your physician °-Make any legal decisions or sign important papers. ° °Meals: °Start with liquid foods such as gelatin or soup. Progress to regular foods as tolerated. Avoid greasy, spicy, heavy foods. If nausea and/or vomiting occur, drink only clear liquids until the nausea and/or vomiting subsides. Call your physician if vomiting continues. ° °Special Instructions/Symptoms: °Your throat may feel dry or sore from the anesthesia or the breathing tube placed in your throat during surgery. If this causes discomfort, gargle with warm salt water. The discomfort should disappear within 24 hours. ° °Regional Anesthesia Blocks ° °1. Numbness or the inability to move the "blocked" extremity may last from 3-48 hours after placement. The length of time depends on the medication injected and your individual response to the medication. If the numbness is not going away after 48 hours, call your surgeon. ° °2. The extremity that is blocked will need to be protected until the numbness is gone and the  Strength has returned. Because you cannot feel it, you will need to take extra care to avoid injury. Because it may be weak, you may have difficulty moving it or using it. You may not know what position it is in without looking at it while the block is in effect. ° °3. For blocks in the legs and feet, returning to weight bearing and walking needs to be done carefully. You will need to wait until the numbness is entirely gone and the strength has returned. You should be able to move your leg and foot normally before you try and bear weight or walk. You will need someone to be with you when you first try to ensure you  do not fall and possibly risk injury. ° °4. Bruising and tenderness at the needle site are common side effects and will resolve in a few days. ° °5. Persistent numbness or new problems with movement should be communicated to the surgeon or the [facility] ([phone])/ [facility] ([phone]). °

## 2012-04-16 NOTE — Transfer of Care (Signed)
Immediate Anesthesia Transfer of Care Note  Patient: Chelsea Gallegos  Procedure(s) Performed: Procedure(s) (LRB): KNEE ARTHROSCOPY WITH MEDIAL MENISECTOMY (Left)  Patient Location: PACU  Anesthesia Type: General and Regional  Level of Consciousness: awake  Airway & Oxygen Therapy: Patient Spontanous Breathing and Patient connected to face mask oxygen  Post-op Assessment: Report given to PACU RN and Post -op Vital signs reviewed and stable  Post vital signs: Reviewed and stable  Complications: No apparent anesthesia complications

## 2012-04-16 NOTE — Interval H&P Note (Signed)
History and Physical Interval Note:  04/16/2012 7:44 AM  Chelsea Gallegos  has presented today for surgery, with the diagnosis of left knee mmt  The various methods of treatment have been discussed with the patient and family. After consideration of risks, benefits and other options for treatment, the patient has consented to  Procedure(s) (LRB): KNEE ARTHROSCOPY WITH MEDIAL MENISECTOMY (Left) as a surgical intervention .  The patients' history has been reviewed, patient examined, no change in status, stable for surgery.  I have reviewed the patients' chart and labs.  Questions were answered to the patient's satisfaction.     [name] F

## 2012-04-16 NOTE — Brief Op Note (Signed)
04/16/2012  10:10 AM  PATIENT:  Chelsea Gallegos  78 y.o. female  PRE-OPERATIVE DIAGNOSIS:  left knee mmt  POST-OPERATIVE DIAGNOSIS:  left knee mmt, Lateral Meniscus Tear & Loose Bodies  PROCEDURE:  Procedure(s) (LRB): KNEE ARTHROSCOPY WITH MEDIAL/lat  MENISECTOMY (Left) and removal loose bodies  SURGEON:  Surgeon(s) and Role:    * Loreta Ave, MD - Primary  PHYSICIAN ASSISTANT: Zonia Kief M  ANESTHESIA:   general  EBL:  Total I/O In: 1000 [I.V.:1000] Out: -  SPECIMEN:  No Specimen  DISPOSITION OF SPECIMEN:  N/A  COUNTS:  YES  TOURNIQUET:  * No tourniquets in log *  PATIENT DISPOSITION:  PACU - hemodynamically stable.

## 2012-04-16 NOTE — Anesthesia Procedure Notes (Signed)
Procedure Name: LMA Insertion Date/Time: 04/16/2012 9:26 AM Performed by: Zenia Resides D Pre-anesthesia Checklist: Patient identified, Emergency Drugs available, Suction available, Patient being monitored and Timeout performed Patient Re-evaluated:Patient Re-evaluated prior to inductionOxygen Delivery Method: Circle System Utilized Preoxygenation: Pre-oxygenation with 100% oxygen Intubation Type: IV induction Ventilation: Mask ventilation without difficulty LMA: LMA with gastric port inserted LMA Size: 4.0 Number of attempts: 1 Placement Confirmation: positive ETCO2,  breath sounds checked- equal and bilateral and CO2 detector Tube secured with: Tape Dental Injury: Teeth and Oropharynx as per pre-operative assessment

## 2012-04-16 NOTE — Progress Notes (Signed)
Assisted Dr. Hodierne with left, knee block. Side rails up, monitors on throughout procedure. See vital signs in flow sheet. Tolerated Procedure well. 

## 2012-04-16 NOTE — Anesthesia Preprocedure Evaluation (Signed)
Anesthesia Evaluation  Patient identified by MRN, date of birth, ID band Patient awake    Reviewed: Allergy & Precautions, H&P , NPO status , Patient's Chart, lab work & pertinent test results  Airway Mallampati: II  Neck ROM: full    Dental   Pulmonary asthma ,          Cardiovascular hypertension,     Neuro/Psych    GI/Hepatic GERD-  ,  Endo/Other  Diabetes mellitus-, Type 2  Renal/GU      Musculoskeletal  (+) Arthritis -,   Abdominal   Peds  Hematology   Anesthesia Other Findings   Reproductive/Obstetrics                           Anesthesia Physical Anesthesia Plan  ASA: III  Anesthesia Plan: General   Post-op Pain Management:    Induction: Intravenous  Airway Management Planned: LMA  Additional Equipment:   Intra-op Plan:   Post-operative Plan:   Informed Consent: I have reviewed the patients History and Physical, chart, labs and discussed the procedure including the risks, benefits and alternatives for the proposed anesthesia with the patient or authorized representative who has indicated his/her understanding and acceptance.     Plan Discussed with: CRNA and Surgeon  Anesthesia Plan Comments:         Anesthesia Quick Evaluation

## 2012-04-16 NOTE — Anesthesia Postprocedure Evaluation (Signed)
Anesthesia Post Note  Patient: Chelsea Gallegos  Procedure(s) Performed: Procedure(s) (LRB): KNEE ARTHROSCOPY WITH MEDIAL MENISECTOMY (Left)  Anesthesia type: General  Patient location: PACU  Post pain: Pain level controlled and Adequate analgesia  Post assessment: Post-op Vital signs reviewed, Patient's Cardiovascular Status Stable, Respiratory Function Stable, Patent Airway and Pain level controlled  Last Vitals:  Filed Vitals:   04/16/12 1030  BP: 122/83  Pulse: 78  Temp:   Resp: 13    Post vital signs: Reviewed and stable  Level of consciousness: awake, alert  and oriented  Complications: No apparent anesthesia complications

## 2012-04-17 LAB — GLUCOSE, CAPILLARY: Glucose-Capillary: 160 mg/dL — ABNORMAL HIGH (ref 70–99)

## 2012-04-17 NOTE — Op Note (Signed)
NAMEMIELA, DESJARDIN NO.:  [id number]  MEDICAL RECORD NO.:  [id number]  LOCATION:                                 FACILITY:  PHYSICIAN:  Loreta Ave, M.D.      DATE OF BIRTH:  DATE OF PROCEDURE:  04/16/2012 DATE OF DISCHARGE:                              OPERATIVE REPORT   PREOPERATIVE DIAGNOSIS:  Left knee extensive medial meniscus tear.  POSTOPERATIVE DIAGNOSES:  Left knee extensive medial meniscus tear. Tearing posterior and lateral meniscus.  Two large loose bodies, one in the cruciate ligaments, one posteromedial.  Grade 2 and 3 changes of patellofemoral joint.  Grade 2 changes of other compartments.  PROCEDURE:  Left knee exam under anesthesia, arthroscopy.  Removal of 2 large loose bodies.  Partial medial and lateral meniscectomy. Tricompartmental chondroplasty.  SURGEON:  Loreta Ave, M.D.  ASSISTANT:  Genene Churn. Denton Meek.  ANESTHESIA:  General.  BLOOD LOSS:  Minimal.  SPECIMENS:  None.  COUNTS:  None.  COMPLICATION:  None.  DRESSINGS:  Soft compressive.  TOURNIQUET:  Not applied.  PROCEDURE:  The patient was brought to the operating room and placed on the operating table in supine position.  After adequate anesthesia had been obtained, leg holder applied, leg prepped and draped in usual sterile fashion.  Two portals, one each medial and lateral parapatellar. Arthroscope introduced.  Knee distended and inspected.  Lateral tracking patellofemoral joint, no tethering.  Grade 2 and 3 changes debrided.  A little thin on the trochlea, but not grade 4.  Cruciate ligaments intact.  A 6-7 mm loose body tethered adjacent to the ACL freed up and removed.  Lateral compartment looked good with the exception of some flap tears off the posterior-third of meniscus.  _________ with a shaver.  Medial compartment also looked good with no evidence of chondral breakdown from the stress fracture seen on MRI.  Extensive complex tear in medial  meniscus.  Posterior half removed, tapered in smoothly.  Another 6-7 mm loose body found posteromedially and removed with a large grasping forceps.  At completion, all aspects of joints reexamined.  No other findings appreciated.  Instruments fully removed.  Portals closed with nylon. Intra-articular injection with Depo-Medrol and Marcaine.  A sterile compressive dressing was applied.  Anesthesia reversed.  Brought to recovery room.  Tolerated the surgery well with no complications.     Loreta Ave, M.D.     DFM/MEDQ  D:  04/16/2012  T:  04/16/2012  Job:  [id number]

## 2012-05-25 ENCOUNTER — Other Ambulatory Visit: Payer: Self-pay | Admitting: Internal Medicine

## 2012-08-23 ENCOUNTER — Other Ambulatory Visit: Payer: Self-pay | Admitting: Physician Assistant

## 2012-10-29 ENCOUNTER — Other Ambulatory Visit: Payer: Self-pay | Admitting: Physician Assistant

## 2013-11-26 ENCOUNTER — Ambulatory Visit (INDEPENDENT_AMBULATORY_CARE_PROVIDER_SITE_OTHER): Payer: Medicare Other | Admitting: *Deleted

## 2013-11-26 DIAGNOSIS — Z23 Encounter for immunization: Secondary | ICD-10-CM

## 2015-05-20 ENCOUNTER — Inpatient Hospital Stay (HOSPITAL_COMMUNITY)
Admission: EM | Admit: 2015-05-20 | Discharge: 2015-05-22 | DRG: 310 | Disposition: A | Discharge status: Home / Self Care | Payer: Medicare Other | Attending: Cardiology | Admitting: Cardiology

## 2015-05-20 ENCOUNTER — Other Ambulatory Visit (HOSPITAL_COMMUNITY): Payer: Self-pay

## 2015-05-20 ENCOUNTER — Ambulatory Visit (INDEPENDENT_AMBULATORY_CARE_PROVIDER_SITE_OTHER): Payer: Medicare Other | Admitting: Family Medicine

## 2015-05-20 ENCOUNTER — Emergency Department (HOSPITAL_COMMUNITY): Payer: Medicare Other

## 2015-05-20 VITALS — BP 100/60 | HR 53 | Temp 97.8°F | Resp 16 | Ht 62.5 in | Wt 167.0 lb

## 2015-05-20 DIAGNOSIS — E119 Type 2 diabetes mellitus without complications: Secondary | ICD-10-CM | POA: Diagnosis present

## 2015-05-20 DIAGNOSIS — K219 Gastro-esophageal reflux disease without esophagitis: Secondary | ICD-10-CM | POA: Diagnosis present

## 2015-05-20 DIAGNOSIS — F458 Other somatoform disorders: Secondary | ICD-10-CM

## 2015-05-20 DIAGNOSIS — Z9114 Patient's other noncompliance with medication regimen: Secondary | ICD-10-CM | POA: Diagnosis present

## 2015-05-20 DIAGNOSIS — M79629 Pain in unspecified upper arm: Secondary | ICD-10-CM | POA: Diagnosis not present

## 2015-05-20 DIAGNOSIS — J45909 Unspecified asthma, uncomplicated: Secondary | ICD-10-CM | POA: Diagnosis present

## 2015-05-20 DIAGNOSIS — R079 Chest pain, unspecified: Secondary | ICD-10-CM

## 2015-05-20 DIAGNOSIS — R0609 Other forms of dyspnea: Secondary | ICD-10-CM

## 2015-05-20 DIAGNOSIS — Z9049 Acquired absence of other specified parts of digestive tract: Secondary | ICD-10-CM | POA: Diagnosis present

## 2015-05-20 DIAGNOSIS — I4891 Unspecified atrial fibrillation: Secondary | ICD-10-CM | POA: Diagnosis present

## 2015-05-20 DIAGNOSIS — Z6832 Body mass index (BMI) 32.0-32.9, adult: Secondary | ICD-10-CM | POA: Diagnosis not present

## 2015-05-20 DIAGNOSIS — I10 Essential (primary) hypertension: Secondary | ICD-10-CM

## 2015-05-20 DIAGNOSIS — M1990 Unspecified osteoarthritis, unspecified site: Secondary | ICD-10-CM | POA: Diagnosis present

## 2015-05-20 DIAGNOSIS — E785 Hyperlipidemia, unspecified: Secondary | ICD-10-CM | POA: Diagnosis present

## 2015-05-20 DIAGNOSIS — Z87891 Personal history of nicotine dependence: Secondary | ICD-10-CM | POA: Diagnosis not present

## 2015-05-20 DIAGNOSIS — R0602 Shortness of breath: Secondary | ICD-10-CM | POA: Diagnosis not present

## 2015-05-20 DIAGNOSIS — Z9119 Patient's noncompliance with other medical treatment and regimen: Secondary | ICD-10-CM

## 2015-05-20 DIAGNOSIS — R0989 Other specified symptoms and signs involving the circulatory and respiratory systems: Secondary | ICD-10-CM

## 2015-05-20 DIAGNOSIS — Z91199 Patient's noncompliance with other medical treatment and regimen due to unspecified reason: Secondary | ICD-10-CM

## 2015-05-20 DIAGNOSIS — E780 Pure hypercholesterolemia: Secondary | ICD-10-CM | POA: Diagnosis present

## 2015-05-20 DIAGNOSIS — M25529 Pain in unspecified elbow: Secondary | ICD-10-CM

## 2015-05-20 LAB — COMPREHENSIVE METABOLIC PANEL
ALBUMIN: 3 g/dL — AB (ref 3.5–5.0)
ALT: 12 U/L — ABNORMAL LOW (ref 14–54)
ANION GAP: 9 (ref 5–15)
AST: 14 U/L — AB (ref 15–41)
Alkaline Phosphatase: 84 U/L (ref 38–126)
BUN: 13 mg/dL (ref 6–20)
CHLORIDE: 107 mmol/L (ref 101–111)
CO2: 28 mmol/L (ref 22–32)
CREATININE: 0.87 mg/dL (ref 0.44–1.00)
Calcium: 8.9 mg/dL (ref 8.9–10.3)
Glucose, Bld: 122 mg/dL — ABNORMAL HIGH (ref 65–99)
Potassium: 3.4 mmol/L — ABNORMAL LOW (ref 3.5–5.1)
Sodium: 144 mmol/L (ref 135–145)
TOTAL PROTEIN: 6.6 g/dL (ref 6.5–8.1)
Total Bilirubin: 0.6 mg/dL (ref 0.3–1.2)

## 2015-05-20 LAB — TSH: TSH: 0.85 u[IU]/mL (ref 0.350–4.500)

## 2015-05-20 LAB — CBC WITH DIFFERENTIAL/PLATELET
BASOS ABS: 0 10*3/uL (ref 0.0–0.1)
Basophils Absolute: 0 10*3/uL (ref 0.0–0.1)
Basophils Relative: 0 % (ref 0–1)
Basophils Relative: 0 % (ref 0–1)
EOS ABS: 0.2 10*3/uL (ref 0.0–0.7)
EOS PCT: 2 % (ref 0–5)
Eosinophils Absolute: 0.1 10*3/uL (ref 0.0–0.7)
Eosinophils Relative: 2 % (ref 0–5)
HCT: 33.7 % — ABNORMAL LOW (ref 36.0–46.0)
HCT: 38.4 % (ref 36.0–46.0)
Hemoglobin: 11.4 g/dL — ABNORMAL LOW (ref 12.0–15.0)
Hemoglobin: 13 g/dL (ref 12.0–15.0)
LYMPHS ABS: 2.8 10*3/uL (ref 0.7–4.0)
LYMPHS PCT: 40 % (ref 12–46)
Lymphocytes Relative: 38 % (ref 12–46)
Lymphs Abs: 2.6 10*3/uL (ref 0.7–4.0)
MCH: 29.3 pg (ref 26.0–34.0)
MCH: 29.5 pg (ref 26.0–34.0)
MCHC: 33.8 g/dL (ref 30.0–36.0)
MCHC: 33.9 g/dL (ref 30.0–36.0)
MCV: 86.7 fL (ref 78.0–100.0)
MCV: 87.1 fL (ref 78.0–100.0)
MONO ABS: 0.5 10*3/uL (ref 0.1–1.0)
MONOS PCT: 7 % (ref 3–12)
MONOS PCT: 6 % (ref 3–12)
Monocytes Absolute: 0.4 10*3/uL (ref 0.1–1.0)
Neutro Abs: 3.5 10*3/uL (ref 1.7–7.7)
Neutro Abs: 3.6 10*3/uL (ref 1.7–7.7)
Neutrophils Relative %: 51 % (ref 43–77)
Neutrophils Relative %: 54 % (ref 43–77)
PLATELETS: 231 10*3/uL (ref 150–400)
Platelets: 194 10*3/uL (ref 150–400)
RBC: 4.43 MIL/uL (ref 3.87–5.11)
RBC: 3.87 MIL/uL (ref 3.87–5.11)
RDW: 13.4 % (ref 11.5–15.5)
RDW: 13.6 % (ref 11.5–15.5)
WBC: 6.9 10*3/uL (ref 4.0–10.5)
WBC: 6.7 10*3/uL (ref 4.0–10.5)

## 2015-05-20 LAB — GLUCOSE, CAPILLARY: GLUCOSE-CAPILLARY: 118 mg/dL — AB (ref 65–99)

## 2015-05-20 LAB — GLUCOSE, POCT (MANUAL RESULT ENTRY): POC Glucose: 118 mg/dl — AB (ref 70–99)

## 2015-05-20 LAB — MAGNESIUM: Magnesium: 2 mg/dL (ref 1.7–2.4)

## 2015-05-20 LAB — PHOSPHORUS: Phosphorus: 3.3 mg/dL (ref 2.5–4.6)

## 2015-05-20 LAB — TROPONIN I: Troponin I: <0.03 ng/mL (ref <0.031)

## 2015-05-20 IMAGING — DX DG CHEST 2V
2 series · 2 of 2 positions shown · non-contrast
Comparison: [DATE] and prior radiographs dating back to
[DATE]

CLINICAL DATA: Left chest pain and shortness of breath for 1 day.

EXAM:
CHEST  2 VIEW

[chest pa]
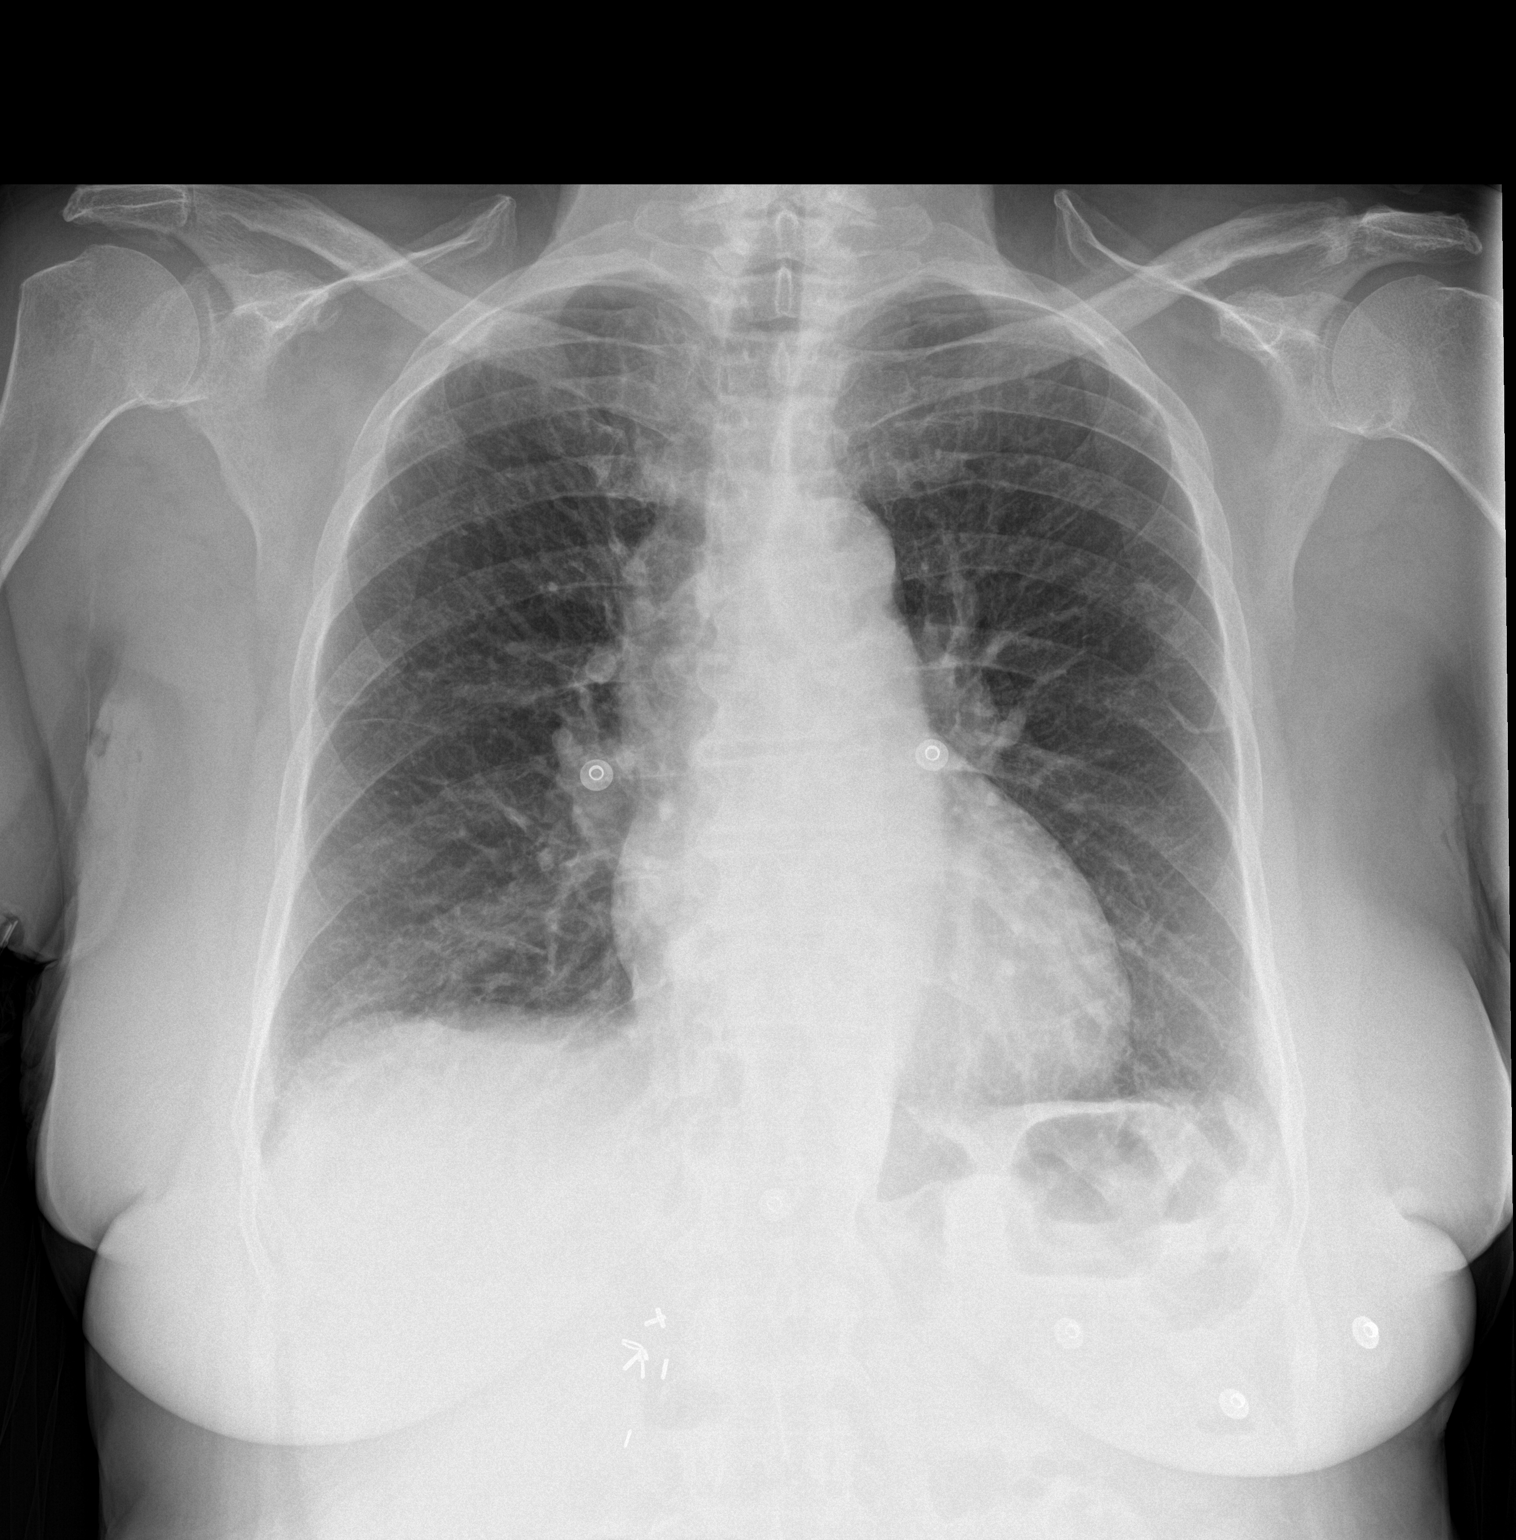

[chest lat]
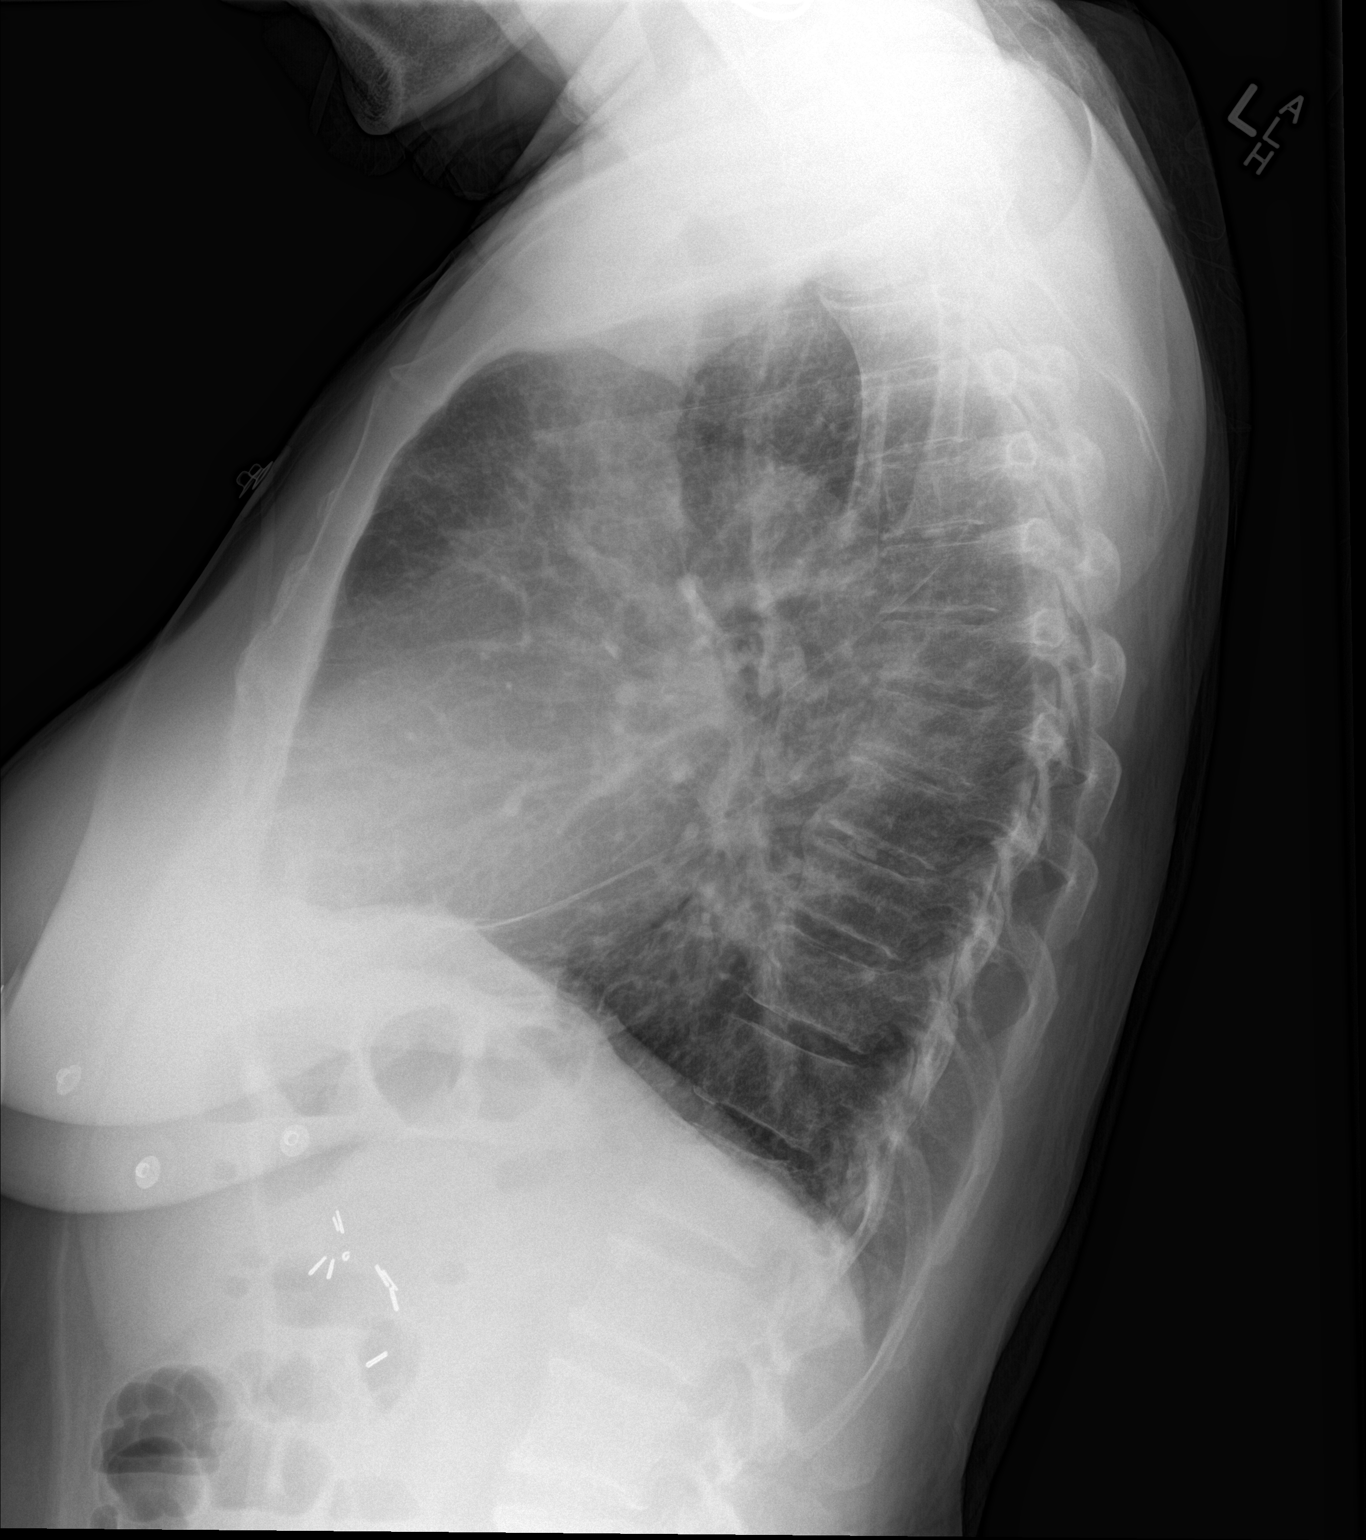

[2 of 2 positions shown; findings below may reference images not displayed]

FINDINGS: The cardiomediastinal silhouette is unremarkable.

There is no evidence of focal airspace disease, pulmonary edema,
suspicious pulmonary nodule/mass, pleural effusion, or pneumothorax.
No acute bony abnormalities are identified.
IMPRESSION: No active cardiopulmonary disease.

## 2015-05-20 MED ORDER — METOPROLOL TARTRATE 12.5 MG HALF TABLET
12.5000 mg | ORAL_TABLET | Freq: Two times a day (BID) | ORAL | Status: DC
  Administered 2015-05-21 – 2015-05-22 (×4): 12.5 mg via ORAL
  Filled 2015-05-20 (×4): qty 1

## 2015-05-20 MED ORDER — ASPIRIN 300 MG RE SUPP: 300.0000 mg | RECTAL | Status: AC

## 2015-05-20 MED ORDER — ACETAMINOPHEN 325 MG PO TABS: 650.0000 mg | ORAL_TABLET | ORAL | Status: DC | PRN

## 2015-05-20 MED ORDER — ONDANSETRON HCL 4 MG/2ML IJ SOLN: 4.0000 mg | Freq: Four times a day (QID) | INTRAMUSCULAR | Status: DC | PRN

## 2015-05-20 MED ORDER — AMIODARONE HCL 200 MG PO TABS
200.0000 mg | ORAL_TABLET | Freq: Two times a day (BID) | ORAL | Status: DC
  Administered 2015-05-21 – 2015-05-22 (×4): 200 mg via ORAL
  Filled 2015-05-20 (×4): qty 1

## 2015-05-20 MED ORDER — SODIUM CHLORIDE 0.9 % IV BOLUS (SEPSIS)
1000.0000 mL | Freq: Once | INTRAVENOUS | Status: AC
  Administered 2015-05-20: 1000 mL via INTRAVENOUS

## 2015-05-20 MED ORDER — ASPIRIN 81 MG PO CHEW: 324.0000 mg | CHEWABLE_TABLET | ORAL | Status: AC

## 2015-05-20 MED ORDER — INSULIN ASPART 100 UNIT/ML ~~LOC~~ SOLN: 0.0000 [IU] | Freq: Three times a day (TID) | SUBCUTANEOUS | Status: DC

## 2015-05-20 MED ORDER — DILTIAZEM HCL 25 MG/5ML IV SOLN
10.0000 mg | Freq: Once | INTRAVENOUS | Status: AC
  Administered 2015-05-20: 10 mg via INTRAVENOUS
  Filled 2015-05-20: qty 5

## 2015-05-20 MED ORDER — DILTIAZEM LOAD VIA INFUSION
10.0000 mg | Freq: Once | INTRAVENOUS | Status: DC
  Filled 2015-05-20 (×3): qty 10

## 2015-05-20 MED ORDER — ASPIRIN 81 MG PO CHEW
324.0000 mg | CHEWABLE_TABLET | Freq: Once | ORAL | Status: AC
  Administered 2015-05-20: 324 mg via ORAL

## 2015-05-20 MED ORDER — AMIODARONE HCL IN DEXTROSE 360-4.14 MG/200ML-% IV SOLN
60.0000 mg/h | INTRAVENOUS | Status: DC
  Filled 2015-05-20: qty 200

## 2015-05-20 MED ORDER — SODIUM CHLORIDE 0.9 % IV SOLN
INTRAVENOUS | Status: DC
  Administered 2015-05-21: 06:00:00 via INTRAVENOUS
  Administered 2015-05-21: 100 mL/h via INTRAVENOUS

## 2015-05-20 MED ORDER — SODIUM CHLORIDE 0.9 % IV SOLN
Freq: Once | INTRAVENOUS | Status: AC
  Administered 2015-05-20: 23:00:00 via INTRAVENOUS

## 2015-05-20 MED ORDER — DILTIAZEM HCL 100 MG IV SOLR
2.5000 mg/h | Freq: Once | INTRAVENOUS | Status: AC
  Administered 2015-05-20: 2.5 mg/h via INTRAVENOUS

## 2015-05-20 MED ORDER — SODIUM CHLORIDE 0.9 % IV SOLN
INTRAVENOUS | Status: DC
  Administered 2015-05-20: 16:00:00 via INTRAVENOUS

## 2015-05-20 MED ORDER — AMIODARONE HCL IN DEXTROSE 360-4.14 MG/200ML-% IV SOLN: 30.0000 mg/h | INTRAVENOUS | Status: DC

## 2015-05-20 MED ORDER — ATORVASTATIN CALCIUM 40 MG PO TABS
40.0000 mg | ORAL_TABLET | Freq: Every day | ORAL | Status: DC
  Administered 2015-05-21: 40 mg via ORAL
  Filled 2015-05-20: qty 1

## 2015-05-20 MED ORDER — NITROGLYCERIN 0.4 MG SL SUBL: 0.4000 mg | SUBLINGUAL_TABLET | SUBLINGUAL | Status: DC | PRN

## 2015-05-20 MED ORDER — ASPIRIN EC 81 MG PO TBEC
81.0000 mg | DELAYED_RELEASE_TABLET | Freq: Every day | ORAL | Status: DC
  Administered 2015-05-21 – 2015-05-22 (×2): 81 mg via ORAL
  Filled 2015-05-20 (×2): qty 1

## 2015-05-20 MED ORDER — HEPARIN (PORCINE) IN NACL 100-0.45 UNIT/ML-% IJ SOLN
1200.0000 [IU]/h | INTRAMUSCULAR | Status: DC
  Administered 2015-05-20: 950 [IU]/h via INTRAVENOUS
  Administered 2015-05-21: 1050 [IU]/h via INTRAVENOUS
  Filled 2015-05-20 (×2): qty 250

## 2015-05-20 MED ORDER — AMIODARONE LOAD VIA INFUSION
150.0000 mg | Freq: Once | INTRAVENOUS | Status: DC
  Filled 2015-05-20: qty 83.34

## 2015-05-20 MED ORDER — HEPARIN BOLUS VIA INFUSION
3500.0000 [IU] | Freq: Once | INTRAVENOUS | Status: AC
  Administered 2015-05-20: 3500 [IU] via INTRAVENOUS
  Filled 2015-05-20: qty 3500

## 2015-05-20 MED ORDER — SODIUM CHLORIDE 0.9 % IV BOLUS (SEPSIS)
500.0000 mL | Freq: Once | INTRAVENOUS | Status: AC
  Administered 2015-05-20: 500 mL via INTRAVENOUS

## 2015-05-20 NOTE — ED Notes (Signed)
To room via EMS from [facility] urgent care.  Onset last night pt was eating chicken livers and felt like was stuck in throat.  Pt vomited x 1.  Pt had chest pain and shortness of breath with exertion last night.  Woke up this morning with shortness of breath with exertion, vomited x 1.  Pt was found to be in Afib at urgent care per EKG.  No c/o chest pain or shortness of breath at this time.  NAD.

## 2015-05-20 NOTE — Progress Notes (Signed)
  Amiodarone Drug - Drug Interaction Consult Note  Recommendations: No medications recommended at this time. Monitor patient for any signs of bradycardia, AV block, and myocardial depression.  Amiodarone is metabolized by the cytochrome P450 system and therefore has the potential to cause many drug interactions. Amiodarone has an average plasma half-life of 50 days (range 20 to 100 days).   There is potential for drug interactions to occur several weeks or months after stopping treatment and the onset of drug interactions may be slow after initiating amiodarone.   []  Statins: Increased risk of myopathy. Simvastatin- restrict dose to 20mg  daily. Other statins: counsel patients to report any muscle pain or weakness immediately.  []  Anticoagulants: Amiodarone can increase anticoagulant effect. Consider warfarin dose reduction. Patients should be monitored closely and the dose of anticoagulant altered accordingly, remembering that amiodarone levels take several weeks to stabilize.  []  Antiepileptics: Amiodarone can increase plasma concentration of phenytoin, the dose should be reduced. Note that small changes in phenytoin dose can result in large changes in levels. Monitor patient and counsel on signs of toxicity.  [x]  Beta blockers: increased risk of bradycardia, AV block and myocardial depression. Sotalol - avoid concomitant use.  [x]   Calcium channel blockers (diltiazem and verapamil): increased risk of bradycardia, AV block and myocardial depression.  []   Cyclosporine: Amiodarone increases levels of cyclosporine. Reduced dose of cyclosporine is recommended.  []  Digoxin dose should be halved when amiodarone is started.  []  Diuretics: increased risk of cardiotoxicity if hypokalemia occurs.  []  Oral hypoglycemic agents (glyburide, glipizide, glimepiride): increased risk of hypoglycemia. Patient's glucose levels should be monitored closely when initiating amiodarone therapy.   []  Drugs that  prolong the QT interval:  Torsades de pointes risk may be increased with concurrent use - avoid if possible.  Monitor QTc, also keep magnesium/potassium WNL if concurrent therapy can't be avoided. Marland Kitchen Antibiotics: e.g. fluoroquinolones, erythromycin. . Antiarrhythmics: e.g. quinidine, procainamide, disopyramide, sotalol. . Antipsychotics: e.g. phenothiazines, haloperidol.  . Lithium, tricyclic antidepressants, and methadone. Thank You,  Levester Fresh, PharmD, BCPS Clinical Pharmacist Pager [phone] 05/20/2015 8:21 PM       \

## 2015-05-20 NOTE — ED Provider Notes (Signed)
CSN: [id number]     Arrival date & time 05/20/15  1134 History   First MD Initiated Contact with Patient 05/20/15 1140     Chief Complaint  Patient presents with  . Shortness of Breath     (Consider location/radiation/quality/duration/timing/severity/associated sxs/prior Treatment) HPI Chelsea Gallegos is a 81 year old female with past medical history of hypertension, diabetes, hyperlipidemia, noncompliant with medications who presents the ER, sent from urgent care for new onset of atrial fibrillation. Patient reports last night having episode of nausea, sudden onset of vomiting and chest pain. She describes the chest pain as a centralized "pain" which she is unable to describe. She states this pain was persistent, however subsided. Patient states this morning she began expressing chest pain again, and noted worsening of her pain and shortness of breath on exertion. Patient states she was in urgent care for further evaluation. Patient was noted by urgent care to be in A. fib with RVR, mildly hypotensive systolic blood pressure in the 80s. Patient was given aspirin 324 mg and sent by EMS for further evaluation. On arrival to the ER, patient states she is completely asymptomatic. Patient denies having any history of atrial fibrillation or irregular heartbeat. Patient denies palpitations, headache, blurred vision, dizziness, weakness, recent illness, cough, abdominal pain.  Past Medical History  Diagnosis Date  . Hypertension   . Diabetes mellitus   . GERD (gastroesophageal reflux disease)   . Arthritis   . Asthma   . Hyperlipidemia ~2005   Past Surgical History  Procedure Laterality Date  . Cholecystectomy  30 yrs ago  . Hernia repair  ~5 yrs ago    umbilical  . Fracture surgery  ~2006    Rt knee  . Eye surgery     No family history on file. History  Substance Use Topics  . Smoking status: Former Smoker    Quit date: 12/09/1972  . Smokeless tobacco: Never Used  . Alcohol Use: Yes      Comment: occasionally   OB History    No data available     Review of Systems  Constitutional: Negative for fever.  HENT: Negative for trouble swallowing.   Eyes: Negative for visual disturbance.  Respiratory: Positive for shortness of breath.   Cardiovascular: Positive for chest pain.  Gastrointestinal: Positive for nausea and vomiting. Negative for abdominal pain.  Genitourinary: Negative for dysuria.  Musculoskeletal: Negative for neck pain.  Skin: Negative for rash.  Neurological: Negative for dizziness, weakness and numbness.  Psychiatric/Behavioral: Negative.     Allergies  Review of patient's allergies indicates no known allergies.  Home Medications   Prior to Admission medications   Medication Sig Start Date End Date Taking? Authorizing Provider  aspirin EC 81 MG tablet Take 81 mg by mouth daily.   Yes Historical Provider, MD  NON FORMULARY Place 1 drop into both eyes daily as needed (Glaucoma/cataracts). 1/4 tsp vinegar in 3-4 oz water   Yes Historical Provider, MD   BP 91/67 mmHg  Pulse 83  Temp(Src) 97.7 F (36.5 C) (Oral)  Resp 15  Ht 5' 1.5" (1.562 m)  Wt 167 lb 11.2 oz (76.068 kg)  BMI 31.18 kg/m2  SpO2 97% Physical Exam  Constitutional: She is oriented to person, place, and time. She appears well-developed and well-nourished. No distress.  HENT:  Head: Normocephalic and atraumatic.  Mouth/Throat: Oropharynx is clear and moist. No oropharyngeal exudate.  Eyes: Right eye exhibits no discharge. Left eye exhibits no discharge. No scleral icterus.  Neck: Normal range of  motion.  Cardiovascular: S1 normal, S2 normal, normal heart sounds and normal pulses.  An irregularly irregular rhythm present. Tachycardia present.   No murmur heard. Rate between 90s and 140s. Irregularly irregular. No murmurs.  Pulmonary/Chest: Effort normal and breath sounds normal. No respiratory distress.  Abdominal: Soft. There is no tenderness.  Musculoskeletal: Normal range of  motion. She exhibits no edema or tenderness.  Neurological: She is alert and oriented to person, place, and time. She has normal strength. No cranial nerve deficit or sensory deficit. Coordination normal. GCS eye subscore is 4. GCS verbal subscore is 5. GCS motor subscore is 6.  Patient fully alert, answering questions appropriately in full, clear sentences. Cranial nerves II through XII grossly intact. Motor strength 5 out of 5 in all major muscle groups of upper and lower extremities. Distal sensation intact.   Skin: Skin is warm and dry. No rash noted. She is not diaphoretic.  Psychiatric: She has a normal mood and affect.  Nursing note and vitals reviewed.   ED Course  Procedures (including critical care time) Labs Review Labs Reviewed  COMPREHENSIVE METABOLIC PANEL - Abnormal; Notable for the following:    Potassium 3.4 (*)    Glucose, Bld 122 (*)    Albumin 3.0 (*)    AST 14 (*)    ALT 12 (*)    All other components within normal limits  CBC WITH DIFFERENTIAL/PLATELET  MAGNESIUM  PHOSPHORUS  TSH  TROPONIN I    Imaging Review Dg Chest 2 View  05/20/2015   CLINICAL DATA:  Left chest pain and shortness of breath for 1 day.  EXAM: CHEST  2 VIEW  COMPARISON:  04/15/2012 and prior radiographs dating back to 06/24/2006  FINDINGS: The cardiomediastinal silhouette is unremarkable.  There is no evidence of focal airspace disease, pulmonary edema, suspicious pulmonary nodule/mass, pleural effusion, or pneumothorax. No acute bony abnormalities are identified.  IMPRESSION: No active cardiopulmonary disease.   Electronically Signed   By: Margarette Canada M.D.   On: 05/20/2015 14:30     EKG Interpretation   Date/Time:  Saturday May 20 2015 11:40:33 EDT Ventricular Rate:  133 PR Interval:    QRS Duration: 67 QT Interval:  291 QTC Calculation: 433 R Axis:   62 Text Interpretation:  new Atrial fibrillation Ventricular premature  complex Low voltage, extremity leads Borderline  repolarization abnormality  Confirmed by Maryan Rued  MD, WHITNEY ([id number]) on 05/20/2015 11:46:15 AM      MDM   Final diagnoses:  SOB (shortness of breath)  Atrial fibrillation, unspecified    New onset A. fib with exertional dyspnea beginning last night. Patient asymptomatic in the ER. Patient with mild hypotension in the 62H systolic. Patient given multiple fluid boluses here which improved her blood pressure into the mid 90s. Patient still expecting RVR. Patient given Cardizem to improve heart rate which rate controlled in the 90s. CHADSVASc score= 5.  Spoke with Dr. Terrence Dupont with Cardiology in consultation patient's case. Dr. Marc Morgans agrees to see patient in the ED for admission. Dr. Terrence Dupont recommends placing patient on heparin drip. The patient appears reasonably stabilized for admission considering the current resources, flow, and capabilities available in the ED at this time, and I doubt any other [facility] requiring further screening and/or treatment in the ED prior to admission.  Signed,  Dahlia Bailiff, PA-C 5:28 PM  Patient seen and discussed with Dr. Blanchie Dessert, MD  Dahlia Bailiff, PA-C 05/20/15 [place], MD 05/25/15 0730

## 2015-05-20 NOTE — ED Notes (Signed)
Give Cardizem per Wille Glaser, [place].

## 2015-05-20 NOTE — H&P (Signed)
Chelsea Gallegos is an 81 y.o. female.   Chief Complaint: Chest pain associated with shortness of breath and feeling weak and tired since yesterday HPI: Patient is 81 year old female with past medical history significant for hypertension, non-insulin-dependent diabetes mellitus, hypercholesteremia, GERD, degenerative joint disease, history of bronchial asthma, came to ER from urgent care Center as patient was noted to be in A. fib with RVR. Patient complains of retrosternal chest pain off and on since yesterday radiating to both arm associated with nausea and diaphoresis after eating food at [employer]. Patient also complains of palpitations off and on since yesterday associated with exertional dyspnea and feeling weak tired and fatigued and was noted to be in A. fib with RVR. Denies any cardiac workup states has not seen any PMD in last 3 years and stopped all her medications. Denies any history of thyroid problems and denies history of rheumatic heart disease denies palpitations in the past. Patient also was noted to be hypotensive and received IV fluid bolus and was started on IV Cardizem with control of heart rate in low 100s. EKG done in the ER showed no acute ischemic changes. Cardiac enzymes negative. Patient denies any cardiac workup in the past.  Past Medical History  Diagnosis Date  . Hypertension   . Diabetes mellitus   . GERD (gastroesophageal reflux disease)   . Arthritis   . Asthma   . Hyperlipidemia ~2005    Past Surgical History  Procedure Laterality Date  . Cholecystectomy  30 yrs ago  . Hernia repair  ~5 yrs ago    umbilical  . Fracture surgery  ~2006    Rt knee  . Eye surgery      No family history on file. Social History:  reports that she quit smoking about 42 years ago. She has never used smokeless tobacco. She reports that she drinks alcohol. She reports that she does not use illicit drugs.  Allergies: No Known Allergies   (Not in a hospital  admission)  Results for orders placed or performed during the hospital encounter of 05/20/15 (from the past 48 hour(s))  CBC with Differential     Status: None   Collection Time: 05/20/15 12:13 PM  Result Value Ref Range   WBC 6.9 4.0 - 10.5 K/uL   RBC 4.43 3.87 - 5.11 MIL/uL   Hemoglobin 13.0 12.0 - 15.0 g/dL   HCT 38.4 36.0 - 46.0 %   MCV 86.7 78.0 - 100.0 fL   MCH 29.3 26.0 - 34.0 pg   MCHC 33.9 30.0 - 36.0 g/dL   RDW 13.4 11.5 - 15.5 %   Platelets 231 150 - 400 K/uL   Neutrophils Relative % 51 43 - 77 %   Neutro Abs 3.5 1.7 - 7.7 K/uL   Lymphocytes Relative 40 12 - 46 %   Lymphs Abs 2.8 0.7 - 4.0 K/uL   Monocytes Relative 7 3 - 12 %   Monocytes Absolute 0.5 0.1 - 1.0 K/uL   Eosinophils Relative 2 0 - 5 %   Eosinophils Absolute 0.2 0.0 - 0.7 K/uL   Basophils Relative 0 0 - 1 %   Basophils Absolute 0.0 0.0 - 0.1 K/uL  Comprehensive metabolic panel     Status: Abnormal   Collection Time: 05/20/15 12:13 PM  Result Value Ref Range   Sodium 144 135 - 145 mmol/L   Potassium 3.4 (L) 3.5 - 5.1 mmol/L   Chloride 107 101 - 111 mmol/L   CO2 28 22 - 32  mmol/L   Glucose, Bld 122 (H) 65 - 99 mg/dL   BUN 13 6 - 20 mg/dL   Creatinine, Ser 0.87 0.44 - 1.00 mg/dL   Calcium 8.9 8.9 - 10.3 mg/dL   Total Protein 6.6 6.5 - 8.1 g/dL   Albumin 3.0 (L) 3.5 - 5.0 g/dL   AST 14 (L) 15 - 41 U/L   ALT 12 (L) 14 - 54 U/L   Alkaline Phosphatase 84 38 - 126 U/L   Total Bilirubin 0.6 0.3 - 1.2 mg/dL   GFR calc non Af Amer >60 >60 mL/min   GFR calc Af Amer >60 >60 mL/min    Comment: (NOTE) The eGFR has been calculated using the CKD EPI equation. This calculation has not been validated in all clinical situations. eGFR's persistently <60 mL/min signify possible Chronic Kidney Disease.    Anion gap 9 5 - 15  Magnesium     Status: None   Collection Time: 05/20/15 12:13 PM  Result Value Ref Range   Magnesium 2.0 1.7 - 2.4 mg/dL  Phosphorus     Status: None   Collection Time: 05/20/15 12:13 PM   Result Value Ref Range   Phosphorus 3.3 2.5 - 4.6 mg/dL  Troponin I     Status: None   Collection Time: 05/20/15 12:13 PM  Result Value Ref Range   Troponin I <0.03 <0.031 ng/mL    Comment:        NO INDICATION OF MYOCARDIAL INJURY.   TSH     Status: None   Collection Time: 05/20/15 12:19 PM  Result Value Ref Range   TSH 0.850 0.350 - 4.500 uIU/mL   Dg Chest 2 View  05/20/2015   CLINICAL DATA:  Left chest pain and shortness of breath for 1 day.  EXAM: CHEST  2 VIEW  COMPARISON:  04/15/2012 and prior radiographs dating back to 06/24/2006  FINDINGS: The cardiomediastinal silhouette is unremarkable.  There is no evidence of focal airspace disease, pulmonary edema, suspicious pulmonary nodule/mass, pleural effusion, or pneumothorax. No acute bony abnormalities are identified.  IMPRESSION: No active cardiopulmonary disease.   Electronically Signed   By: Margarette Canada M.D.   On: 05/20/2015 14:30    Review of Systems  Constitutional: Positive for diaphoresis. Negative for fever and chills.  Eyes: Negative for double vision.  Respiratory: Positive for shortness of breath. Negative for cough, hemoptysis and sputum production.   Cardiovascular: Positive for chest pain and palpitations. Negative for claudication and leg swelling.  Gastrointestinal: Negative for nausea, vomiting and abdominal pain.  Genitourinary: Negative for dysuria, urgency and frequency.  Neurological: Positive for dizziness and weakness. Negative for headaches.    Blood pressure 100/66, pulse 125, temperature 97.7 F (36.5 C), temperature source Oral, resp. rate 14, height 5' 1.5" (1.562 m), weight 76.068 kg (167 lb 11.2 oz), SpO2 100 %. Physical Exam  Constitutional: She is oriented to person, place, and time.  HENT:  Head: Normocephalic and atraumatic.  Eyes: Conjunctivae are normal. Pupils are equal, round, and reactive to light. Left eye exhibits no discharge.  Neck: Normal range of motion. Neck supple. No JVD  present. No tracheal deviation present. No thyromegaly present.  Cardiovascular:  Irregularly irregular S1 and S2 soft  Respiratory: Effort normal and breath sounds normal. No respiratory distress. She has no wheezes. She has no rales.  GI: Soft. Bowel sounds are normal. She exhibits no distension. There is no tenderness. There is no rebound.  Musculoskeletal: She exhibits no edema or tenderness.  Neurological:  She is alert and oriented to person, place, and time.     Assessment/Plan Atypical chest pain rule out MI New-onset A. fib with RVR Hypertension Diabetes mellitus Hypercholesteremia Morbid obesity Degenerative joint disease GERD Plan As per orders  Charolette Forward 05/20/2015, 6:51 PM

## 2015-05-20 NOTE — ED Notes (Signed)
No c/o chest pain or shortness of breath.

## 2015-05-20 NOTE — Patient Instructions (Signed)
We will have you evaluated at the emergency room for your chest pain and trouble breathing. If follow up needed after the emergency room - please return to see [place] or other care provider.

## 2015-05-20 NOTE — ED Notes (Signed)
Per Dr. Maryan Rued start Cardizem drip at 2.5mg /hr, increase by 2.5mg /hr with maximum 10mg /hr.  Notify MD for systolic OM<35.

## 2015-05-20 NOTE — Progress Notes (Signed)
ANTICOAGULATION CONSULT NOTE - Initial Consult  Pharmacy Consult for Heparin Indication: atrial fibrillation  No Known Allergies  Patient Measurements: Height: 5' 1.5" (156.2 cm) Weight: 167 lb 11.2 oz (76.068 kg) (pt reports weight at urgent care this morning ) IBW/kg (Calculated) : 48.95 Heparin Dosing Weight: 65.7 kg Vital Signs: Temp: 97.7 F (36.5 C) (06/11 1153) Temp Source: Oral (06/11 1153) BP: 91/67 mmHg (06/11 1630) Pulse Rate: 83 (06/11 1630)  Labs:  Recent Labs  05/20/15 1213  HGB 13.0  HCT 38.4  PLT 231  CREATININE 0.87  TROPONINI <0.03    Estimated Creatinine Clearance: 50.3 mL/min (by C-G formula based on Cr of 0.87).   Medical History: Past Medical History  Diagnosis Date  . Hypertension   . Diabetes mellitus   . GERD (gastroesophageal reflux disease)   . Arthritis   . Asthma   . Hyperlipidemia ~2005   Assessment: 81 yo female admitted with new-onset afib.  No anticoagulants noted PTA.  Pharmacy asked to begin anticoagulation with IV heparin.  Baseline CBC WNL.  Goal of Therapy:  Heparin level 0.3-0.7 units/ml Monitor platelets by anticoagulation protocol: Yes   Plan:  1. Heparin bolus of 3500 units IV x 1. 2. Then start heparin gtt at 950 units/hr. 3. Check heparin level 6 hrs after gtt starts. 4. Daily heparin level and CBC. 5. F/u plans for oral anticoagulation.  Uvaldo Rising, BCPS  Clinical Pharmacist Pager [phone]  05/20/2015 5:38 PM

## 2015-05-20 NOTE — Progress Notes (Signed)
Subjective:   This chart was scribed for Chelsea Ray, MD by Erling Conte, Medical Scribe. This patient was seen in Room 1 and the patient's care was started at 10:07 AM.   Patient ID: Chelsea Gallegos, female    DOB: 01/01/1934, 81 y.o.   MRN: [id number]  Chief Complaint  Patient presents with  . other    Pt ate chicken liver yesterday and felt sick after eating it  . other    feels like food still sitting in chest  . Emesis    Did vomit once   . Extremity Weakness    in both arms x last night  . Shortness of Breath    noticed some SOB while walking today    HPI HPI Comments: Chelsea Gallegos is a 81 y.o. female with a h/o HTN and DM who presents to the [facility] with multiple complaints.  Pt states yesterday she ate chicken liver at a restaurant. She states she came home and 1 episode of emesis. She notes she feels like there may still be food trapped in her esophagus. Pt reports after the vomiting episode she began to have 9/10, pressure like chest pain. She reports with the chest pain she developed intermittent, SOB that is exacerbated with ambulation, along with chest tightness, and diaphoresis. She notes lying down alleviates the SOB. She states when she was ambulating last night she noticed increased lightheadedness, dizziness, and heart palpitations. Pt states these symptoms have resolved at this moment. She denies any LOC but states she felt like she was going to. She denies any h/o heart problems or h/o stress tests. Pt also reports bilateral arm pain that radiates throughout her entire arm. She had 1 episode of vomiting productive of clear phlegm this morning with associated nausea. She reports she took 1 baby aspirin last night. Pt states she has not taken DM or BP medications for 2 years. Pt denies h/o peptic ulcers. She denies melena, bloody stools, hematemesis, or calf swelling.  Patient Active Problem List   Diagnosis Date Noted  .  Hypertension 02/27/2012  . Hyperlipidemia 02/27/2012  . Diabetes type 2, controlled 02/27/2012   Past Medical History  Diagnosis Date  . Hypertension   . Diabetes mellitus   . GERD (gastroesophageal reflux disease)   . Arthritis   . Asthma   . Hyperlipidemia ~2005   Past Surgical History  Procedure Laterality Date  . Cholecystectomy  30 yrs ago  . Hernia repair  ~5 yrs ago    umbilical  . Fracture surgery  ~2006    Rt knee  . Eye surgery     No Known Allergies Prior to Admission medications   Medication Sig Start Date End Date Taking? Authorizing Provider  aspirin 81 MG tablet Take 81 mg by mouth daily.    Historical Provider, MD  lisinopril (PRINIVIL,ZESTRIL) 5 MG tablet TAKE 1 TABLET DAILY (NEED APPOINTMENT) Patient not taking: Reported on 05/20/2015 08/23/12   Mancel Bale, PA-C  methylPREDNISolone (MEDROL) 4 MG tablet 2 daily with food Patient not taking: Reported on 05/20/2015 02/27/12   Robyn Haber, MD  omeprazole (PRILOSEC) 40 MG capsule Take 40 mg by mouth as needed.    Historical Provider, MD  simvastatin (ZOCOR) 20 MG tablet Take 20 mg by mouth every evening.    Historical Provider, MD  sitaGLIPtan-metformin (JANUMET) 50-1000 MG per tablet Take 1 tablet by mouth 2 (two) times daily with a meal. Patient not taking: Reported on  05/20/2015 03/14/12   Robyn Haber, MD   History   Social History  . Marital Status: Single    Spouse Name: N/A  . Number of Children: N/A  . Years of Education: N/A   Occupational History  . Not on file.   Social History Main Topics  . Smoking status: Former Smoker    Quit date: 12/09/1972  . Smokeless tobacco: Never Used  . Alcohol Use: Yes     Comment: occasionally  . Drug Use: No  . Sexual Activity: No   Other Topics Concern  . Not on file   Social History Narrative    Review of Systems  Constitutional: Positive for diaphoresis.  Respiratory: Positive for chest tightness and shortness of breath.   Cardiovascular:  Positive for chest pain and palpitations. Negative for leg swelling.  Gastrointestinal: Positive for nausea and vomiting (2x). Negative for blood in stool.  Musculoskeletal: Positive for myalgias.  Neurological: Positive for dizziness and light-headedness.       Objective:   Physical Exam  Constitutional: She is oriented to person, place, and time. She appears well-developed and well-nourished. No distress.  HENT:  Head: Normocephalic and atraumatic.  Eyes: Conjunctivae and EOM are normal. Pupils are equal, round, and reactive to light.  Neck: Neck supple. Carotid bruit is not present. No tracheal deviation present.  Cardiovascular: Normal rate, normal heart sounds and intact distal pulses.  An irregular rhythm present.  Pulmonary/Chest: Effort normal and breath sounds normal. No respiratory distress. She has no wheezes.  Chest wall is non tenderness, unable to reproduce pain. Lungs clear. No stridor  Abdominal: Soft. She exhibits no pulsatile midline mass. There is no tenderness.  Musculoskeletal: Normal range of motion.  Calves non tender, no lower extremity swelling  Neurological: She is alert and oriented to person, place, and time.  Equal upper extremity and lower extremity strength. No focal weakness. Equal facial movements  Skin: Skin is warm and dry.  Psychiatric: She has a normal mood and affect. Her behavior is normal.  Nursing note and vitals reviewed.  Filed Vitals:   05/20/15 0917  BP: 100/60  Pulse: 53  Temp: 97.8 F (36.6 C)  TempSrc: Oral  Resp: 16  Height: 5' 2.5" (1.588 m)  Weight: 167 lb (75.751 kg)  SpO2: 94%   EKG reading by Dr. Carlota Raspberry Atrial fibrillation rate of 146, possible non specific t-waves laterally and flattened t-waves inferiorally. No prior EKG available for review     Assessment & Plan:   Chelsea Gallegos is a 81 y.o. female Chest pain, unspecified chest pain type - Plan: EKG 12-Lead  DOE (dyspnea on exertion) - Plan: EKG  12-Lead  Pain in joint, upper arm, unspecified laterality  Globus sensation  Essential hypertension  Type 2 diabetes mellitus without complication - Plan: POCT glucose (manual entry)  History of nonadherence to medical treatment  Atrial fibrillation, unspecified  Hx of HTN and diabetes with med nonadherence for past 2 years, no recent medical evaluation. Started with chest pain and shortness of breath with activity since last night, accompanied by pain down both arms, nausea, and diaphoresis. No chest pain in office, but still dyspneic on activity.   -EMS called for transport, IV placed, placed on monitor (rate approx 140), ASA 324mg  chewable given.   -10:52 AM report given to EMS with transfer of care, charge nurse advised at [facility].   Advised to follow up after hospitalization for restarting medication.   No orders of the defined types were placed  in this encounter.   Patient Instructions  We will have you evaluated at the emergency room for your chest pain and trouble breathing. If follow up needed after the emergency room - please return to see [place] or other care provider.      I personally performed the services described in this documentation, which was scribed in my presence. The recorded information has been reviewed and considered, and addended by me as needed.

## 2015-05-21 ENCOUNTER — Encounter (HOSPITAL_COMMUNITY): Payer: Self-pay | Admitting: *Deleted

## 2015-05-21 ENCOUNTER — Inpatient Hospital Stay (HOSPITAL_COMMUNITY): Payer: Medicare Other

## 2015-05-21 DIAGNOSIS — I4891 Unspecified atrial fibrillation: Secondary | ICD-10-CM

## 2015-05-21 LAB — CBC
HEMATOCRIT: 33.7 % — AB (ref 36.0–46.0)
HEMOGLOBIN: 11.2 g/dL — AB (ref 12.0–15.0)
MCH: 29.1 pg (ref 26.0–34.0)
MCHC: 33.2 g/dL (ref 30.0–36.0)
MCV: 87.5 fL (ref 78.0–100.0)
Platelets: 200 10*3/uL (ref 150–400)
RBC: 3.85 MIL/uL — AB (ref 3.87–5.11)
RDW: 13.8 % (ref 11.5–15.5)
WBC: 6.2 10*3/uL (ref 4.0–10.5)

## 2015-05-21 LAB — COMPREHENSIVE METABOLIC PANEL
ALK PHOS: 72 U/L (ref 38–126)
ALT: 10 U/L — ABNORMAL LOW (ref 14–54)
AST: 13 U/L — ABNORMAL LOW (ref 15–41)
Albumin: 2.5 g/dL — ABNORMAL LOW (ref 3.5–5.0)
Anion gap: 7 (ref 5–15)
BILIRUBIN TOTAL: 0.5 mg/dL (ref 0.3–1.2)
BUN: 10 mg/dL (ref 6–20)
CHLORIDE: 110 mmol/L (ref 101–111)
CO2: 23 mmol/L (ref 22–32)
CREATININE: 0.72 mg/dL (ref 0.44–1.00)
Calcium: 7.7 mg/dL — ABNORMAL LOW (ref 8.9–10.3)
GFR calc non Af Amer: >60 mL/min (ref >60)
Glucose, Bld: 178 mg/dL — ABNORMAL HIGH (ref 65–99)
Potassium: 2.7 mmol/L — CL (ref 3.5–5.1)
Sodium: 140 mmol/L (ref 135–145)
Total Protein: 5.2 g/dL — ABNORMAL LOW (ref 6.5–8.1)

## 2015-05-21 LAB — GLUCOSE, CAPILLARY
GLUCOSE-CAPILLARY: 96 mg/dL (ref 65–99)
Glucose-Capillary: 86 mg/dL (ref 65–99)
Glucose-Capillary: 50 mg/dL — ABNORMAL LOW (ref 65–99)
Glucose-Capillary: 93 mg/dL (ref 65–99)

## 2015-05-21 LAB — HEPARIN LEVEL (UNFRACTIONATED)
Heparin Unfractionated: 0.3 IU/mL (ref 0.30–0.70)
Heparin Unfractionated: 0.37 IU/mL (ref 0.30–0.70)

## 2015-05-21 LAB — NM MYOCAR MULTI W/SPECT W/WALL MOTION / EF
LHR: 0.3
LV dias vol: 72 mL
LV sys vol: 18 mL
NUC STRESS EF: 75 %
NUC STRESS TID: 0.95
SDS: 1
SRS: 0
SSS: 1

## 2015-05-21 LAB — POTASSIUM: Potassium: 3.4 mmol/L — ABNORMAL LOW (ref 3.5–5.1)

## 2015-05-21 LAB — APTT: aPTT: 128 seconds — ABNORMAL HIGH (ref 24–37)

## 2015-05-21 LAB — TROPONIN I
Troponin I: <0.03 ng/mL (ref <0.031)
Troponin I: <0.03 ng/mL (ref <0.031)

## 2015-05-21 LAB — MRSA PCR SCREENING: MRSA by PCR: NEGATIVE

## 2015-05-21 LAB — MAGNESIUM: Magnesium: 2 mg/dL (ref 1.7–2.4)

## 2015-05-21 LAB — PROTIME-INR
INR: 1.16 (ref 0.00–1.49)
PROTHROMBIN TIME: 15 s (ref 11.6–15.2)

## 2015-05-21 MED ORDER — TECHNETIUM TC 99M SESTAMIBI GENERIC - CARDIOLITE
30.0000 | Freq: Once | INTRAVENOUS | Status: AC | PRN
  Administered 2015-05-21: 30 via INTRAVENOUS

## 2015-05-21 MED ORDER — POTASSIUM CHLORIDE CRYS ER 20 MEQ PO TBCR
40.0000 meq | EXTENDED_RELEASE_TABLET | Freq: Once | ORAL | Status: AC
  Administered 2015-05-21: 40 meq via ORAL
  Filled 2015-05-21: qty 2

## 2015-05-21 MED ORDER — TECHNETIUM TC 99M SESTAMIBI GENERIC - CARDIOLITE
10.0000 | Freq: Once | INTRAVENOUS | Status: AC | PRN
  Administered 2015-05-21: 10 via INTRAVENOUS

## 2015-05-21 MED ORDER — REGADENOSON 0.4 MG/5ML IV SOLN
0.4000 mg | Freq: Once | INTRAVENOUS | Status: AC
  Administered 2015-05-21: 0.4 mg via INTRAVENOUS
  Filled 2015-05-21: qty 5

## 2015-05-21 MED ORDER — POTASSIUM CHLORIDE 10 MEQ/100ML IV SOLN
10.0000 meq | INTRAVENOUS | Status: AC
  Administered 2015-05-21 (×2): 10 meq via INTRAVENOUS
  Filled 2015-05-21: qty 100

## 2015-05-21 MED ORDER — OFF THE BEAT BOOK
Freq: Once | Status: AC
  Administered 2015-05-21: 02:00:00
  Filled 2015-05-21: qty 1

## 2015-05-21 MED ORDER — REGADENOSON 0.4 MG/5ML IV SOLN
INTRAVENOUS | Status: AC
  Filled 2015-05-21: qty 5

## 2015-05-21 NOTE — Progress Notes (Signed)
Noted that Pt had converted to SR on the monitor. Pt due to be changed from IV cardizem to Amiodarone. Pt asymptomatic. BP in the 90's. Dr Terrence Dupont made aware and orders received to not start IV amio and to give PO amio and her dose of metoprolol after receiving a bolus. Will medicate and bolus and cont to monitor.

## 2015-05-21 NOTE — Progress Notes (Signed)
Subjective:  Patient denies any further chest pains or shortness of breath. Spontaneously converted to sinus rhythm prior to starting amiodarone. Started on low-dose amiodarone and remains in sinus rhythm. Electrolytes from this a.m. still pending. 2-D echo not done yet.  Objective:  Vital Signs in the last 24 hours: Temp:  [97.5 F (36.4 C)-97.8 F (36.6 C)] 97.7 F (36.5 C) (06/12 0420) Pulse Rate:  [70-125] 89 (06/12 1006) Resp:  [11-21] 15 (06/12 0015) BP: (82-113)/(40-74) 109/62 mmHg (06/12 1006) SpO2:  [97 %-100 %] 99 % (06/12 0420) Weight:  [76.068 kg (167 lb 11.2 oz)-78.654 kg (173 lb 6.4 oz)] 78.518 kg (173 lb 1.6 oz) (06/12 0420)  Intake/Output from previous day: 06/11 0701 - 06/12 0700 In: 3454 [P.O.:240; I.V.:2964] Out: -  Intake/Output from this shift:    Physical Exam: Neck: no adenopathy, no carotid bruit, no JVD and supple, symmetrical, trachea midline Lungs: clear to auscultation bilaterally Heart: regular rate and rhythm, S1, S2 normal and Soft systolic murmur noted Abdomen: soft, non-tender; bowel sounds normal; no masses,  no organomegaly Extremities: extremities normal, atraumatic, no cyanosis or edema  Lab Results:  Recent Labs  05/20/15 2330 05/21/15 0428  WBC 6.7 6.2  HGB 11.4* 11.2*  PLT 194 200    Recent Labs  05/20/15 1213 05/20/15 2330  NA 144 140  K 3.4* 2.7*  CL 107 110  CO2 28 23  GLUCOSE 122* 178*  BUN 13 10  CREATININE 0.87 0.72    Recent Labs  05/20/15 2330 05/21/15 0428  TROPONINI <0.03 <0.03   Hepatic Function Panel  Recent Labs  05/20/15 2330  PROT 5.2*  ALBUMIN 2.5*  AST 13*  ALT 10*  ALKPHOS 72  BILITOT 0.5   No results for input(s): CHOL in the last 72 hours. No results for input(s): PROTIME in the last 72 hours.  Imaging: Imaging results have been reviewed and Dg Chest 2 View  05/20/2015   CLINICAL DATA:  Left chest pain and shortness of breath for 1 day.  EXAM: CHEST  2 VIEW  COMPARISON:  04/15/2012  and prior radiographs dating back to 06/24/2006  FINDINGS: The cardiomediastinal silhouette is unremarkable.  There is no evidence of focal airspace disease, pulmonary edema, suspicious pulmonary nodule/mass, pleural effusion, or pneumothorax. No acute bony abnormalities are identified.  IMPRESSION: No active cardiopulmonary disease.   Electronically Signed   By: Margarette Canada M.D.   On: 05/20/2015 14:30    Cardiac Studies:  Assessment/Plan:  Atypical chest pain MI ruled out Status post New-onset A. fib with RVR Hypertension Diabetes mellitus Hypercholesteremia Morbid obesity Degenerative joint disease GERD Plan Check labs and 2-D echo Check nuclear stress test result if negative for ischemia will switch the IV heparin to Eliquis.  LOS: 1 day    Charolette Forward 05/21/2015, 11:01 AM

## 2015-05-21 NOTE — Progress Notes (Signed)
Pt CBG 50, pt ate dinner, rechecked CBG 96. Pt not having any symptoms will continue to monitor

## 2015-05-21 NOTE — Progress Notes (Signed)
Echocardiogram 2D Echocardiogram has been performed.  Chelsea Gallegos 05/21/2015, 12:52 PM

## 2015-05-21 NOTE — Progress Notes (Signed)
ANTICOAGULATION CONSULT NOTE - Follow Up Consult  Pharmacy Consult for Heparin Indication: chest pain/ACS and atrial fibrillation  No Known Allergies  Patient Measurements: Height: 5' 1.5" (156.2 cm) Weight: 173 lb 1.6 oz (78.518 kg) IBW/kg (Calculated) : 48.95 Heparin Dosing Weight: 66 kg  Vital Signs: Temp: 97.7 F (36.5 C) (06/12 1111) Temp Source: Oral (06/12 1111) BP: 131/82 mmHg (06/12 1132) Pulse Rate: 73 (06/12 1111)  Labs:  Recent Labs  05/20/15 1213 05/20/15 2330 05/21/15 0428 05/21/15 0438 05/21/15 1126  HGB 13.0 11.4* 11.2*  --   --   HCT 38.4 33.7* 33.7*  --   --   PLT 231 194 200  --   --   APTT  --  128*  --   --   --   LABPROT  --   --   --  15.0  --   INR  --   --   --  1.16  --   HEPARINUNFRC  --  0.30  --  0.37  --   CREATININE 0.87 0.72  --   --   --   TROPONINI <0.03 <0.03 <0.03  --  <0.03    Estimated Creatinine Clearance: 55.6 mL/min (by C-G formula based on Cr of 0.72).  Assessment:   On heparin for afib. Noted spontaneously converted.   Heparin level remains therapeutic (0.37) on 1050 units/hr.   Enzymes negative. For echo and stress test.  Goal of Therapy:  Heparin level 0.3-0.7 units/ml Monitor platelets by anticoagulation protocol: Yes   Plan:   Continue heparin drip at 1050 units/hr.  Daily heparin level and CBC while on heparin.  Follow up for switch to Eliquis if stress test negative for ischemia.  Arty Baumgartner, [place] Pager: [phone] 05/21/2015,12:47 PM

## 2015-05-21 NOTE — Progress Notes (Signed)
ANTICOAGULATION CONSULT NOTE Pharmacy Consult for Heparin Indication: atrial fibrillation  No Known Allergies  Patient Measurements: Height: 5' 1.5" (156.2 cm) Weight: 173 lb 6.4 oz (78.654 kg) IBW/kg (Calculated) : 48.95 Heparin Dosing Weight: 65.7 kg Vital Signs: Temp: 97.8 F (36.6 C) (06/11 2030) BP: 98/63 mmHg (06/11 2030) Pulse Rate: 97 (06/11 2030)  Labs:  Recent Labs  05/20/15 1213 05/20/15 2330  HGB 13.0 11.4*  HCT 38.4 33.7*  PLT 231 194  HEPARINUNFRC  --  0.30  CREATININE 0.87  --   TROPONINI <0.03  --     Estimated Creatinine Clearance: 51.2 mL/min (by C-G formula based on Cr of 0.87).  Assessment: 81 yo female with afib for heparin Goal of Therapy:  Heparin level 0.3-0.7 units/ml Monitor platelets by anticoagulation protocol: Yes   Plan:  Increase Heparin 1050 units/hr Follow-up am labs.  Phillis Knack, PharmD, BCPS   05/21/2015 12:06 AM

## 2015-05-21 NOTE — Progress Notes (Signed)
Lab called with K of 2.7. Dr Terrence Dupont made aware and orders received. Will medicate and cont to monitor.

## 2015-05-22 LAB — CBC
HCT: 33.1 % — ABNORMAL LOW (ref 36.0–46.0)
Hemoglobin: 10.8 g/dL — ABNORMAL LOW (ref 12.0–15.0)
MCH: 28.4 pg (ref 26.0–34.0)
MCHC: 32.6 g/dL (ref 30.0–36.0)
MCV: 87.1 fL (ref 78.0–100.0)
Platelets: 206 10*3/uL (ref 150–400)
RBC: 3.8 MIL/uL — ABNORMAL LOW (ref 3.87–5.11)
RDW: 13.7 % (ref 11.5–15.5)
WBC: 4.9 10*3/uL (ref 4.0–10.5)

## 2015-05-22 LAB — HEMOGLOBIN A1C
HEMOGLOBIN A1C: 6.9 % — AB (ref 4.8–5.6)
Mean Plasma Glucose: 151 mg/dL

## 2015-05-22 LAB — GLUCOSE, CAPILLARY
Glucose-Capillary: 87 mg/dL (ref 65–99)
Glucose-Capillary: 79 mg/dL (ref 65–99)

## 2015-05-22 LAB — HEPARIN LEVEL (UNFRACTIONATED): HEPARIN UNFRACTIONATED: 0.19 [IU]/mL — AB (ref 0.30–0.70)

## 2015-05-22 MED ORDER — AMIODARONE HCL 200 MG PO TABS: 200.0000 mg | ORAL_TABLET | Freq: Every day | ORAL | Status: DC

## 2015-05-22 MED ORDER — ATORVASTATIN CALCIUM 40 MG PO TABS: 40.0000 mg | ORAL_TABLET | Freq: Every day | ORAL | Status: DC

## 2015-05-22 MED ORDER — NITROGLYCERIN 0.4 MG SL SUBL: 0.4000 mg | SUBLINGUAL_TABLET | SUBLINGUAL | Status: DC | PRN

## 2015-05-22 MED ORDER — APIXABAN 5 MG PO TABS: 5.0000 mg | ORAL_TABLET | Freq: Two times a day (BID) | ORAL | Status: DC

## 2015-05-22 MED ORDER — APIXABAN 5 MG PO TABS
5.0000 mg | ORAL_TABLET | Freq: Two times a day (BID) | ORAL | Status: DC
  Administered 2015-05-22: 5 mg via ORAL
  Filled 2015-05-22: qty 2

## 2015-05-22 MED ORDER — METOPROLOL TARTRATE 25 MG PO TABS: 12.5000 mg | ORAL_TABLET | Freq: Two times a day (BID) | ORAL | Status: DC

## 2015-05-22 NOTE — Discharge Instructions (Signed)
Information on my medicine - ELIQUIS® (apixaban) ° °This medication education was reviewed with me or my healthcare representative as part of my discharge preparation.  The pharmacist that spoke with me during my hospital stay was:  Arvell Pulsifer G, RPH ° °Why was Eliquis® prescribed for you? °Eliquis® was prescribed for you to reduce the risk of a blood clot forming that can cause a stroke if you have a medical condition called atrial fibrillation (a type of irregular heartbeat). ° °What do You need to know about Eliquis® ? °Take your Eliquis® TWICE DAILY - one tablet in the morning and one tablet in the evening with or without food. If you have difficulty swallowing the tablet whole please discuss with your pharmacist how to take the medication safely. ° °Take Eliquis® exactly as prescribed by your doctor and DO NOT stop taking Eliquis® without talking to the doctor who prescribed the medication.  Stopping may increase your risk of developing a stroke.  Refill your prescription before you run out. ° °After discharge, you should have regular check-up appointments with your healthcare provider that is prescribing your Eliquis®.  In the future your dose may need to be changed if your kidney function or weight changes by a significant amount or as you get older. ° °What do you do if you miss a dose? °If you miss a dose, take it as soon as you remember on the same day and resume taking twice daily.  Do not take more than one dose of ELIQUIS at the same time to make up a missed dose. ° °Important Safety Information °A possible side effect of Eliquis® is bleeding. You should call your healthcare provider right away if you experience any of the following: °  Bleeding from an injury or your nose that does not stop. °  Unusual colored urine (red or dark brown) or unusual colored stools (red or black). °  Unusual bruising for unknown reasons. °  A serious fall or if you hit your head (even if there is no  bleeding). ° °Some medicines may interact with Eliquis® and might increase your risk of bleeding or clotting while on Eliquis®. To help avoid this, consult your healthcare provider or pharmacist prior to using any new prescription or non-prescription medications, including herbals, vitamins, non-steroidal anti-inflammatory drugs (NSAIDs) and supplements. ° °This website has more information on Eliquis® (apixaban): http://www.eliquis.com/eliquis/home °Atrial Fibrillation °Atrial fibrillation is a condition that causes your heart to beat irregularly. It may also cause your heart to beat faster than normal. Atrial fibrillation can prevent your heart from pumping blood normally. It increases your risk of stroke and heart problems. °HOME CARE °· Take medications as told by your doctor. °· Only take medications that your doctor says are safe. Some medications can make the condition worse or happen again. °· If blood thinners were prescribed by your doctor, take them exactly as told. Too much can cause bleeding. Too little and you will not have the needed protection against stroke and other problems. °· Perform blood tests at home if told by your doctor. °· Perform blood tests exactly as told by your doctor. °· Do not drink alcohol. °· Do not drink beverages with caffeine such as coffee, soda, and some teas. °· Maintain a healthy weight. °· Do not use diet pills unless your doctor says they are safe. They may make heart problems worse. °· Follow diet instructions as told by your doctor. °· Exercise regularly as told by your doctor. °· Keep all   follow-up appointments. °GET HELP IF: °· You notice a change in the speed, rhythm, or strength of your heartbeat. °· You suddenly begin peeing (urinating) more often. °· You get tired more easily when moving or exercising. °GET HELP RIGHT AWAY IF:  °· You have chest or belly (abdominal) pain. °· You feel sick to your stomach (nauseous). °· You are short of breath. °· You suddenly  have swollen feet and ankles. °· You feel dizzy. °· You face, arms, or legs feel numb or weak. °· There is a change in your vision or speech. °MAKE SURE YOU:  °· Understand these instructions. °· Will watch your condition. °· Will get help right away if you are not doing well or get worse. °Document Released: 09/03/2008 Document Revised: 04/11/2014 Document Reviewed: 01/05/2013 °ExitCare® Patient Information ©2015 [employer]. This information is not intended to replace advice given to you by your health care provider. Make sure you discuss any questions you have with your health care provider. ° °

## 2015-05-22 NOTE — Discharge Summary (Signed)
Discharge summary dictated on 05/22/2015 dictation number is [id number]

## 2015-05-22 NOTE — Care Management Note (Signed)
Case Management Note  Patient Details  Name: Chelsea Gallegos MRN: [id number] Date of Birth: Dec 15, 1933  Subjective/Objective:       afib              Action/Plan:   Expected Discharge Date:    05/22/2015              Expected Discharge Plan:  Home/Self Care  In-House Referral:     Discharge planning Services  CM Consult, Medication Assistance    Status of Service:  Completed, signed off  Medicare Important Message Given:  N/A - LOS <3 / Initial given by admissions Date Medicare IM Given:    Medicare IM give by:    Date Additional Medicare IM Given:    Additional Medicare Important Message give by:     If discussed at [place] of Stay Meetings, dates discussed:    Additional Comments: Consulted for Eliquis. Provided pt with Eliquis 30 day free trial card. Medication will require a prior auth. Explained prior auth process to pt and pharmacy will send to MD office to complete.  Erenest Rasher, RN 05/22/2015, 12:35 PM

## 2015-05-22 NOTE — Progress Notes (Signed)
ANTICOAGULATION CONSULT NOTE Pharmacy Consult for Heparin Indication: atrial fibrillation  No Known Allergies  Patient Measurements: Height: 5' 1.5" (156.2 cm) Weight: 176 lb 3.2 oz (79.924 kg) IBW/kg (Calculated) : 48.95 Heparin Dosing Weight: 65.7 kg Vital Signs: Temp: 98.1 F (36.7 C) (06/13 0400) BP: 102/65 mmHg (06/13 0400) Pulse Rate: 71 (06/13 0400)  Labs:  Recent Labs  05/20/15 1213 05/20/15 2330 05/21/15 0428 05/21/15 0438 05/21/15 1126 05/22/15 0434  HGB 13.0 11.4* 11.2*  --   --  10.8*  HCT 38.4 33.7* 33.7*  --   --  33.1*  PLT 231 194 200  --   --  206  APTT  --  128*  --   --   --   --   LABPROT  --   --   --  15.0  --   --   INR  --   --   --  1.16  --   --   HEPARINUNFRC  --  0.30  --  0.37  --  0.19*  CREATININE 0.87 0.72  --   --   --   --   TROPONINI <0.03 <0.03 <0.03  --  <0.03  --     Estimated Creatinine Clearance: 56.2 mL/min (by C-G formula based on Cr of 0.72).  Assessment: 81 yo female with afib for heparin Goal of Therapy:  Heparin level 0.3-0.7 units/ml Monitor platelets by anticoagulation protocol: Yes   Plan:  Increase Heparin 1200 units/hr F/U plan for anticoagulation  Phillis Knack, PharmD, BCPS   05/22/2015 6:30 AM

## 2015-05-23 NOTE — Discharge Summary (Signed)
NAMEANNICK, DIMAIO            ACCOUNT NO.:  [id number]  MEDICAL RECORD NO.:  [id number]  LOCATION:  3W22C                        FACILITY:  [facility]  PHYSICIAN:  Jeancarlo Leffler N. Terrence Dupont, M.D. DATE OF BIRTH:  06/07/1934  DATE OF ADMISSION:  05/20/2015 DATE OF DISCHARGE:  05/22/2015                              DISCHARGE SUMMARY   ADMITTING DIAGNOSES: 1. Atypical chest pain, rule out myocardial infarction. 2. New-onset atrial fibrillation with rapid ventricular response. 3. Hypertension. 4. Diabetes mellitus. 5. Hypercholesteremia. 6. Morbid obesity. 7. Degenerative joint disease. 8. Gastroesophageal reflux disease.  DISCHARGE DIAGNOSES: 1. Status post atypical chest pain, myocardial infarction ruled out.     Negative nuclear stress test. 2. Status post atrial fibrillation with rapid ventricular response     converted to sinus rhythm. 3. Hypertension. 4. Diabetes mellitus controlled by diet. 5. Hypercholesteremia. 6. Morbid obesity. 7. Degenerative joint disease. 8. Gastroesophageal reflux disease.  DISCHARGE HOME MEDICATIONS: 1. Amiodarone 200 mg 1 tablet daily. 2. Eliquis 5 mg 1 tablet twice daily. 3. Atorvastatin 40 mg daily. 4. Metoprolol tartrate 12.5 mg twice daily. 5. Nitrostat sublingual p.r.n.  DIET:  Low-salt, low-cholesterol 1800 calories ADA diet.  The patient has been advised to monitor blood pressure and blood sugar daily in chart.  FOLLOWUP:  Follow up with me in 1 week.  Follow up with PMD as scheduled.  CONDITION AT DISCHARGE:  Stable.  BRIEF HISTORY AND HOSPITAL COURSE:  Ms. Scalia is a 81 year old female with past medical history significant for hypertension, non- insulin-dependent diabetes mellitus, hypercholesteremia, GERD, degenerative joint disease, history of bronchial asthma.  She came to the ER from urgent care center as the patient was noted to be in AFib with RVR.  The patient complains of retrosternal chest pain off and on since  yesterday radiating to both arms associated with nausea and diaphoresis after eating food at [facility].  The patient also complains of palpitations off and on since yesterday associated with exertional dyspnea and feeling weak, tired, fatigued, and was noted to be in AFib with RVR.  The patient denies any cardiac workup.  States has not seen any PMD in last 3 years, stopped all her medications.  Denies any history of thyroid problems.  Denies history of rheumatic fever or rheumatic heart disease.  Denies palpitation in the past.  The patient was noted to be hypotensive, received IV fluid bolus and was started on IV Cardizem with control of heart rate in low 100s.  EKG done in the ED showed no acute ischemic changes.  Cardiac enzymes first set was negative.  The patient denies any cardiac workup in the past.  PHYSICAL EXAMINATION:  GENERAL:  She was alert, awake, oriented x3. VITAL SIGNS:  Blood pressure was 100/66, pulse 125 irregularly irregular.  She was afebrile. HEENT:  Conjunctivae were pink. NECK:  Supple.  No JVD.  No bruit. LUNGS:  Clear to auscultation without rhonchi or rales. CARDIOVASCULAR:  Irregularly irregular.  Tachycardic. S1, S2 was soft. ABDOMEN:  Soft.  Bowel sounds were present.  Nontender. EXTREMITIES:  There was no clubbing, cyanosis, or edema.  LABORATORY DATA:  Sodium was 144, potassium 3.4, BUN 13, creatinine 0.87, glucose was 122.  Four sets of  troponin-I were normal.  Hemoglobin 13, hematocrit 38.4, white count of 6.9.  TSH was 0.850.  Hemoglobin A1c and lipid panel is pending.  Nuclear stress test done yesterday showed no evidence of ischemia with normal EF.  HOSPITAL COURSE:  The patient was admitted to step-down unit.  MI was ruled out by serial enzymes and EKG.  The patient spontaneously converted to sinus rhythm and was started on p.o. amiodarone and has remained in sinus rhythm.  The patient subsequently underwent 2D echo and nuclear stress test  which showed normal wall motion abnormalities and normal LV systolic function, and nuclear stress test showed no evidence of reversible ischemia with normal EF.  The patient is ambulating in room without any problems.  Has remained in sinus rhythm. The patient will be discharged home on above medications and will be followed up in my office in 1 week and PMD as scheduled.     Allegra Lai. Terrence Dupont, M.D.     MNH/MEDQ  D:  05/22/2015  T:  05/23/2015  Job:  [id number]

## 2015-06-17 ENCOUNTER — Ambulatory Visit (INDEPENDENT_AMBULATORY_CARE_PROVIDER_SITE_OTHER): Payer: Medicare Other

## 2015-06-17 ENCOUNTER — Ambulatory Visit (INDEPENDENT_AMBULATORY_CARE_PROVIDER_SITE_OTHER): Payer: Medicare Other | Admitting: Emergency Medicine

## 2015-06-17 VITALS — BP 112/64 | HR 76 | Temp 98.8°F | Resp 18 | Ht 62.0 in | Wt 171.0 lb

## 2015-06-17 DIAGNOSIS — M25571 Pain in right ankle and joints of right foot: Secondary | ICD-10-CM | POA: Diagnosis not present

## 2015-06-17 DIAGNOSIS — M25474 Effusion, right foot: Secondary | ICD-10-CM | POA: Diagnosis not present

## 2015-06-17 DIAGNOSIS — R0609 Other forms of dyspnea: Secondary | ICD-10-CM

## 2015-06-17 LAB — CBC
HEMATOCRIT: 37.5 % (ref 36.0–46.0)
HEMOGLOBIN: 12.4 g/dL (ref 12.0–15.0)
MCH: 29.4 pg (ref 26.0–34.0)
MCHC: 33.1 g/dL (ref 30.0–36.0)
MCV: 88.9 fL (ref 78.0–100.0)
MPV: 10.3 fL (ref 8.6–12.4)
Platelets: 230 10*3/uL (ref 150–400)
RBC: 4.22 MIL/uL (ref 3.87–5.11)
RDW: 14 % (ref 11.5–15.5)
WBC: 6.4 10*3/uL (ref 4.0–10.5)

## 2015-06-17 LAB — BASIC METABOLIC PANEL
BUN: 11 mg/dL (ref 6–23)
CALCIUM: 8.9 mg/dL (ref 8.4–10.5)
CHLORIDE: 105 meq/L (ref 96–112)
CO2: 25 meq/L (ref 19–32)
Creat: 0.82 mg/dL (ref 0.50–1.10)
Glucose, Bld: 153 mg/dL — ABNORMAL HIGH (ref 70–99)
Potassium: 3.7 mEq/L (ref 3.5–5.3)
Sodium: 141 mEq/L (ref 135–145)

## 2015-06-17 LAB — URIC ACID: Uric Acid, Serum: 6.7 mg/dL (ref 2.4–7.0)

## 2015-06-17 IMAGING — CR DG FOOT COMPLETE 3+V*R*
3 series · 3 of 3 positions shown · non-contrast
Comparison: None.

CLINICAL DATA: Patient with right foot swelling for 2 weeks.

EXAM:
RIGHT FOOT COMPLETE - 3+ VIEW

[AP]
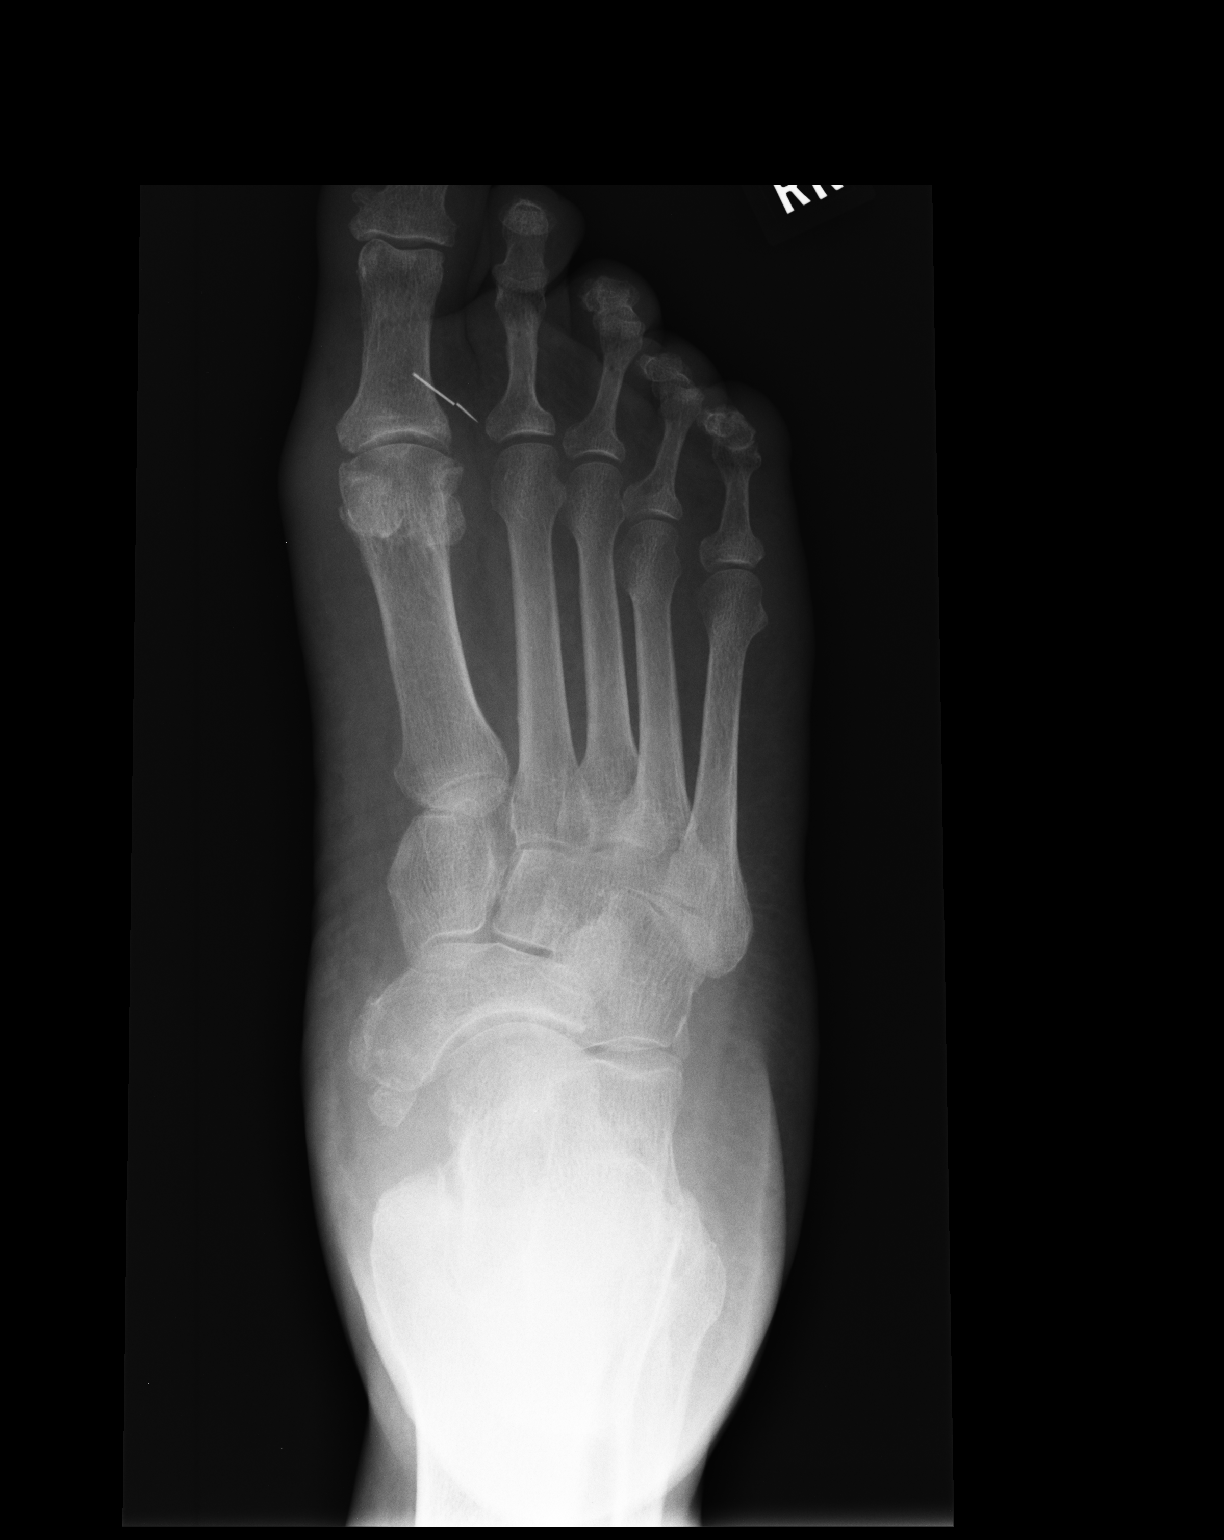

[ap obl int rot]
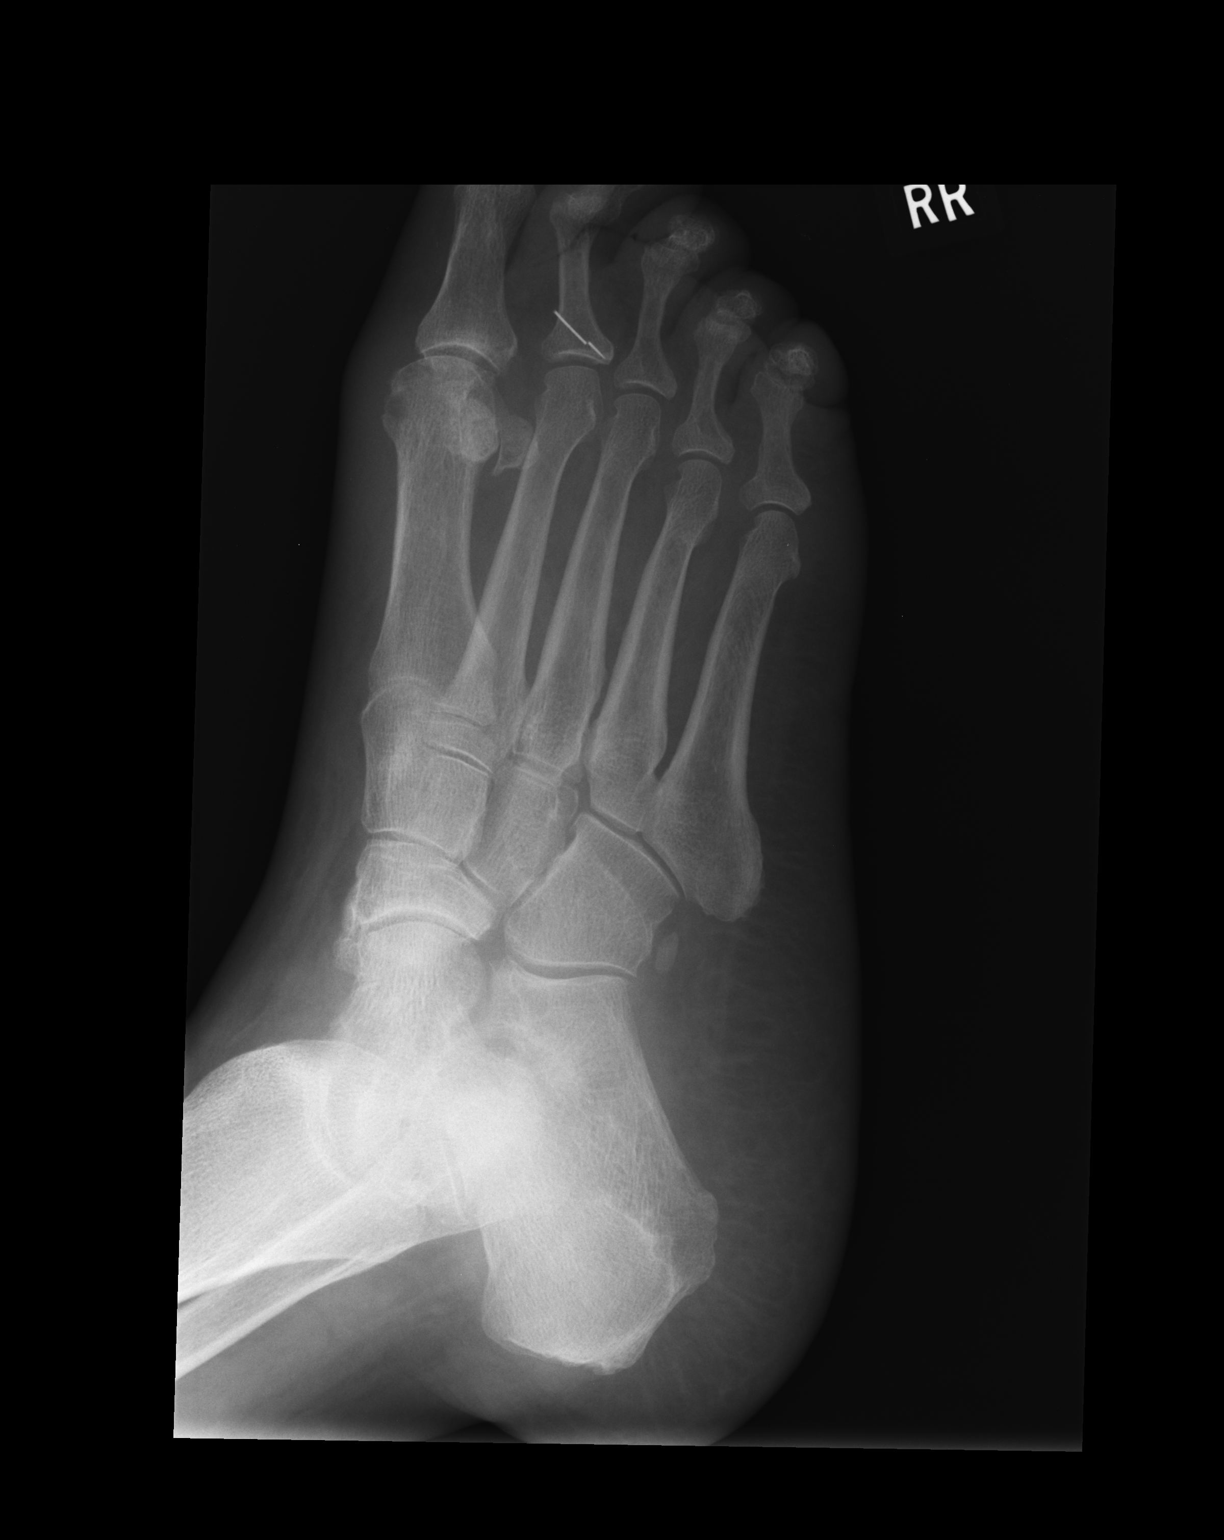

[lateral]
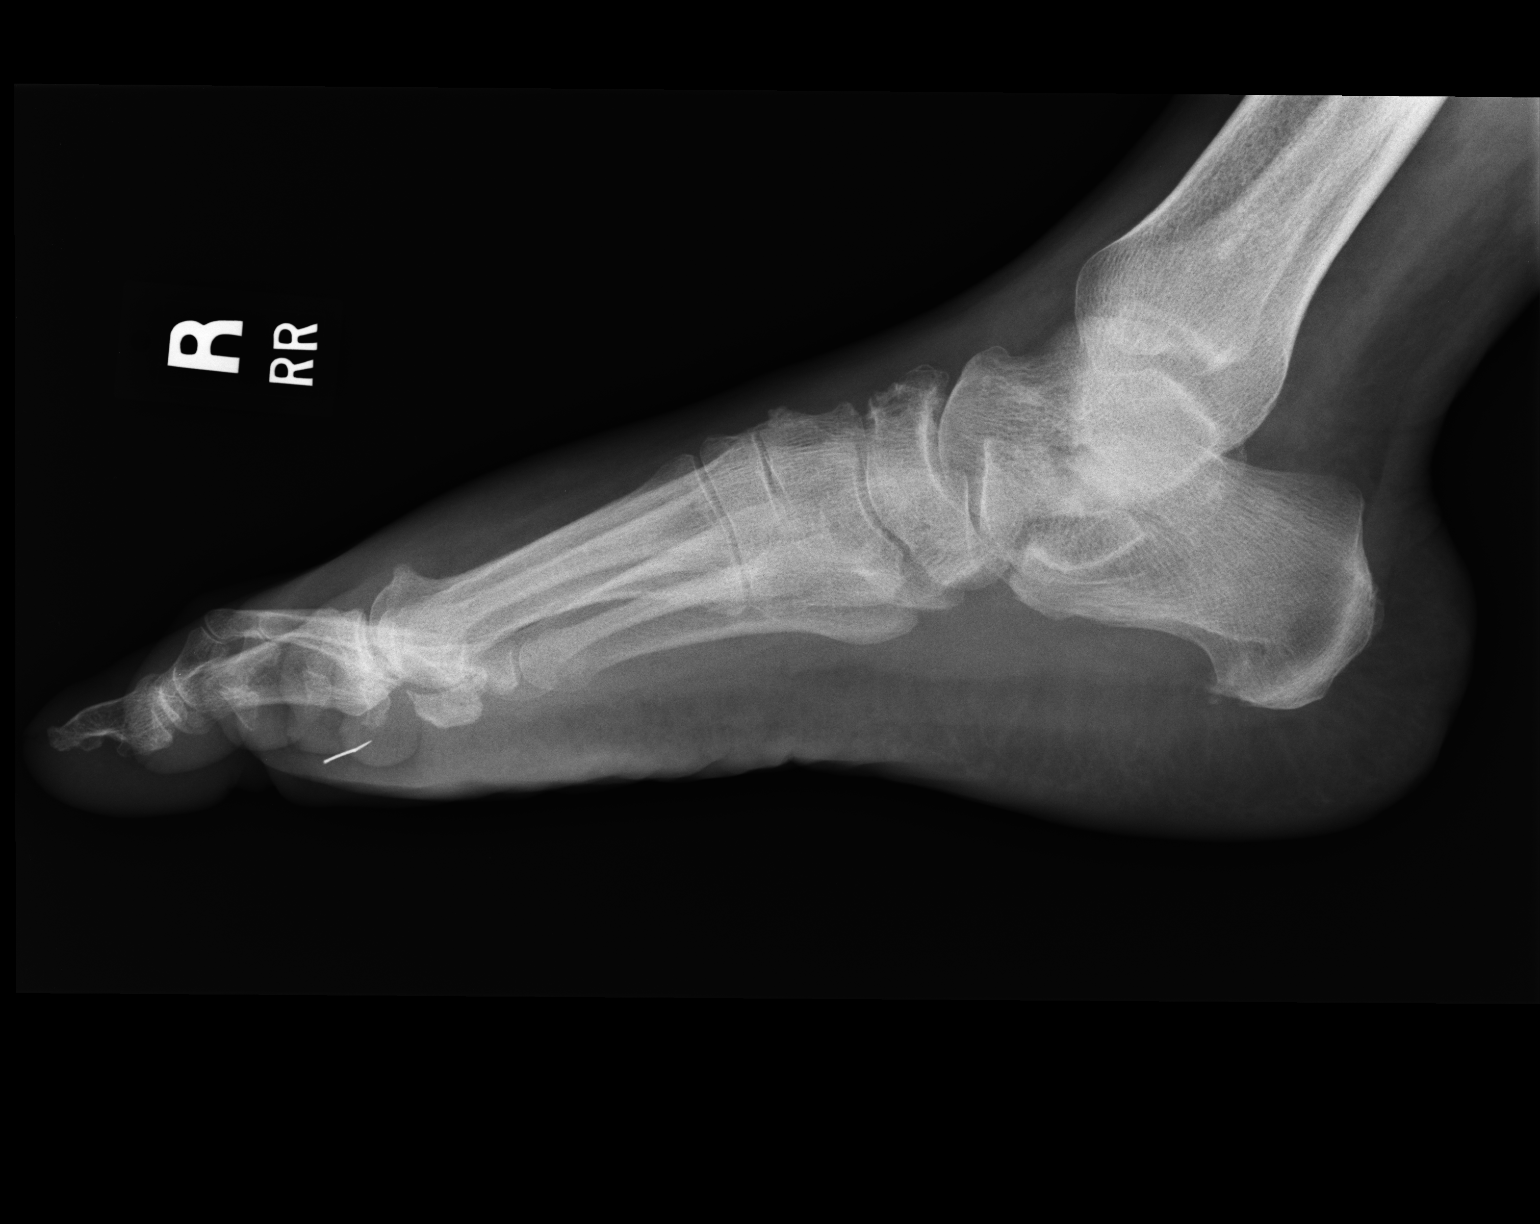

[3 of 3 positions shown; findings below may reference images not displayed]

FINDINGS: Radiopaque foreign body within the volar soft tissues between the
first and second MTP joints. First MTP joint degenerative changes.
Midfoot degenerative changes. Soft tissue swelling about the foot.
No evidence for acute fracture.
IMPRESSION: Radiopaque foreign body within the volar soft tissues between the
first and second MTP joints.

Degenerative changes.

No acute osseous abnormality.

## 2015-06-17 MED ORDER — COLCHICINE 0.6 MG PO TABS: 0.6000 mg | ORAL_TABLET | Freq: Every day | ORAL | Status: DC

## 2015-06-17 MED ORDER — CEPHALEXIN 500 MG PO CAPS: 500.0000 mg | ORAL_CAPSULE | Freq: Three times a day (TID) | ORAL | Status: DC

## 2015-06-17 NOTE — Progress Notes (Addendum)
This chart was scribed for Arlyss Queen, MD by Marti Sleigh, Medical Scribe. This patient was seen in Room 10 and the patient's care was started at 11:09 AM.  Chief Complaint:  Chief Complaint  Patient presents with  . Gout    rt since wednesday   . Foot Pain  . Edema    HPI: Chelsea Gallegos is a 81 y.o. female who is here for swelling in right foot for the last two weeks. Pt states she reported to [facility] two weeks ago due to SOB and swelling in legs, and was sent to the ED and kept there for two days due to heart issues. In the ED pt was put on amiodarone 1/2 tablet daily, lipitor daily, and eliquis 5, twice per day. Pt's cardiologist is Dr. Terrence Dupont. Pt states she has increased her water intake. She has had severe right foot pain since Wednesday this has been associated with redness and swelling of the foot.   Past Medical History  Diagnosis Date  . Hypertension   . Diabetes mellitus   . GERD (gastroesophageal reflux disease)   . Arthritis   . Asthma   . Hyperlipidemia ~2005   Past Surgical History  Procedure Laterality Date  . Cholecystectomy  30 yrs ago  . Hernia repair  ~5 yrs ago    umbilical  . Fracture surgery  ~2006    Rt knee  . Eye surgery     History   Social History  . Marital Status: Single    Spouse Name: N/A  . Number of Children: N/A  . Years of Education: N/A   Social History Main Topics  . Smoking status: Former Smoker    Quit date: 12/09/1972  . Smokeless tobacco: Never Used  . Alcohol Use: Yes     Comment: occasionally  . Drug Use: No  . Sexual Activity: No   Other Topics Concern  . None   Social History Narrative   No family history on file. No Known Allergies Prior to Admission medications   Medication Sig Start Date End Date Taking? Authorizing Provider  amiodarone (PACERONE) 200 MG tablet Take 1 tablet (200 mg total) by mouth daily. Patient taking differently: Take 200 mg by mouth daily. Takes 1/2 tablet 05/22/15  Yes Charolette Forward, MD  apixaban (ELIQUIS) 5 MG TABS tablet Take 1 tablet (5 mg total) by mouth 2 (two) times daily. 05/22/15  Yes Charolette Forward, MD  atorvastatin (LIPITOR) 40 MG tablet Take 1 tablet (40 mg total) by mouth daily at 6 PM. 05/22/15  Yes Charolette Forward, MD  nitroGLYCERIN (NITROSTAT) 0.4 MG SL tablet Place 1 tablet (0.4 mg total) under the tongue every 5 (five) minutes x 3 doses as needed for chest pain. 05/22/15  Yes Charolette Forward, MD  metoprolol tartrate (LOPRESSOR) 25 MG tablet Take 0.5 tablets (12.5 mg total) by mouth 2 (two) times daily. Patient not taking: Reported on 06/17/2015 05/22/15   Charolette Forward, MD  NON FORMULARY Place 1 drop into both eyes daily as needed (Glaucoma/cataracts). 1/4 tsp vinegar in 3-4 oz water    Historical Provider, MD     ROS: The patient denies fevers, chills, night sweats, unintentional weight loss, chest pain, palpitations, wheezing, nausea, vomiting, abdominal pain, dysuria, hematuria, melena, numbness, weakness, or tingling.  All other systems have been reviewed and were otherwise negative with the exception of those mentioned in the HPI and as above.    PHYSICAL EXAM: Filed Vitals:   06/17/15 1059  BP: 112/64  Pulse: 76  Temp: 98.8 F (37.1 C)  Resp: 18   Body mass index is 31.27 kg/(m^2).   General: Alert, no acute distress HEENT:  Normocephalic, atraumatic, oropharynx patent. EOMI, PERRLA Cardiovascular:  Regular rate and rhythm, no rubs murmurs or gallops.  No Carotid bruits, radial pulse intact. No pedal edema.  Respiratory: Clear to auscultation bilaterally.  No wheezes, rales, or rhonchi.  No cyanosis, no use of accessory musculature GI: No organomegaly, abdomen is soft and non-tender, positive bowel sounds.  No masses. Skin: No rashes. Neurologic: Facial musculature symmetric. Psychiatric: Patient is appropriate throughout our interaction. Lymphatic: No cervical lymphadenopathy Musculoskeletal: Gait intact. Right foot 3+ swelling with  redness and increased warmth, also significant tenderness over right great toe.    LABS: Results for orders placed or performed during the hospital encounter of 05/20/15  MRSA PCR Screening  Result Value Ref Range   MRSA by PCR NEGATIVE NEGATIVE  NM Myocar Multi W/Spect W/Wall Motion / EF  Result Value Ref Range   Nuc Stress EF 75 %   LV Systolic Volume 18 mL   TID 6.43    LV Diastolic Volume 72 mL   Rate 0.30    SSS 1    SRS 0    SDS 1   CBC with Differential  Result Value Ref Range   WBC 6.9 4.0 - 10.5 K/uL   RBC 4.43 3.87 - 5.11 MIL/uL   Hemoglobin 13.0 12.0 - 15.0 g/dL   HCT 38.4 36.0 - 46.0 %   MCV 86.7 78.0 - 100.0 fL   MCH 29.3 26.0 - 34.0 pg   MCHC 33.9 30.0 - 36.0 g/dL   RDW 13.4 11.5 - 15.5 %   Platelets 231 150 - 400 K/uL   Neutrophils Relative % 51 43 - 77 %   Neutro Abs 3.5 1.7 - 7.7 K/uL   Lymphocytes Relative 40 12 - 46 %   Lymphs Abs 2.8 0.7 - 4.0 K/uL   Monocytes Relative 7 3 - 12 %   Monocytes Absolute 0.5 0.1 - 1.0 K/uL   Eosinophils Relative 2 0 - 5 %   Eosinophils Absolute 0.2 0.0 - 0.7 K/uL   Basophils Relative 0 0 - 1 %   Basophils Absolute 0.0 0.0 - 0.1 K/uL  Comprehensive metabolic panel  Result Value Ref Range   Sodium 144 135 - 145 mmol/L   Potassium 3.4 (L) 3.5 - 5.1 mmol/L   Chloride 107 101 - 111 mmol/L   CO2 28 22 - 32 mmol/L   Glucose, Bld 122 (H) 65 - 99 mg/dL   BUN 13 6 - 20 mg/dL   Creatinine, Ser 0.87 0.44 - 1.00 mg/dL   Calcium 8.9 8.9 - 10.3 mg/dL   Total Protein 6.6 6.5 - 8.1 g/dL   Albumin 3.0 (L) 3.5 - 5.0 g/dL   AST 14 (L) 15 - 41 U/L   ALT 12 (L) 14 - 54 U/L   Alkaline Phosphatase 84 38 - 126 U/L   Total Bilirubin 0.6 0.3 - 1.2 mg/dL   GFR calc non Af Amer >60 >60 mL/min   GFR calc Af Amer >60 >60 mL/min   Anion gap 9 5 - 15  Magnesium  Result Value Ref Range   Magnesium 2.0 1.7 - 2.4 mg/dL  Phosphorus  Result Value Ref Range   Phosphorus 3.3 2.5 - 4.6 mg/dL  TSH  Result Value Ref Range   TSH 0.850 0.350 -  4.500 uIU/mL  Troponin  I  Result Value Ref Range   Troponin I <0.03 <0.031 ng/mL  Heparin level (unfractionated)  Result Value Ref Range   Heparin Unfractionated 0.30 0.30 - 0.70 IU/mL  Heparin level (unfractionated)  Result Value Ref Range   Heparin Unfractionated 0.37 0.30 - 0.70 IU/mL  Comprehensive metabolic panel  Result Value Ref Range   Sodium 140 135 - 145 mmol/L   Potassium 2.7 (LL) 3.5 - 5.1 mmol/L   Chloride 110 101 - 111 mmol/L   CO2 23 22 - 32 mmol/L   Glucose, Bld 178 (H) 65 - 99 mg/dL   BUN 10 6 - 20 mg/dL   Creatinine, Ser 0.72 0.44 - 1.00 mg/dL   Calcium 7.7 (L) 8.9 - 10.3 mg/dL   Total Protein 5.2 (L) 6.5 - 8.1 g/dL   Albumin 2.5 (L) 3.5 - 5.0 g/dL   AST 13 (L) 15 - 41 U/L   ALT 10 (L) 14 - 54 U/L   Alkaline Phosphatase 72 38 - 126 U/L   Total Bilirubin 0.5 0.3 - 1.2 mg/dL   GFR calc non Af Amer >60 >60 mL/min   GFR calc Af Amer >60 >60 mL/min   Anion gap 7 5 - 15  Troponin I  Result Value Ref Range   Troponin I <0.03 <0.031 ng/mL  Hemoglobin A1c  Result Value Ref Range   Hgb A1c MFr Bld 6.9 (H) 4.8 - 5.6 %   Mean Plasma Glucose 151 mg/dL  CBC WITH DIFFERENTIAL  Result Value Ref Range   WBC 6.7 4.0 - 10.5 K/uL   RBC 3.87 3.87 - 5.11 MIL/uL   Hemoglobin 11.4 (L) 12.0 - 15.0 g/dL   HCT 33.7 (L) 36.0 - 46.0 %   MCV 87.1 78.0 - 100.0 fL   MCH 29.5 26.0 - 34.0 pg   MCHC 33.8 30.0 - 36.0 g/dL   RDW 13.6 11.5 - 15.5 %   Platelets 194 150 - 400 K/uL   Neutrophils Relative % 54 43 - 77 %   Neutro Abs 3.6 1.7 - 7.7 K/uL   Lymphocytes Relative 38 12 - 46 %   Lymphs Abs 2.6 0.7 - 4.0 K/uL   Monocytes Relative 6 3 - 12 %   Monocytes Absolute 0.4 0.1 - 1.0 K/uL   Eosinophils Relative 2 0 - 5 %   Eosinophils Absolute 0.1 0.0 - 0.7 K/uL   Basophils Relative 0 0 - 1 %   Basophils Absolute 0.0 0.0 - 0.1 K/uL  APTT  Result Value Ref Range   aPTT 128 (H) 24 - 37 seconds  Glucose, capillary  Result Value Ref Range   Glucose-Capillary 118 (H) 65 - 99 mg/dL    Protime-INR  Result Value Ref Range   Prothrombin Time 15.0 11.6 - 15.2 seconds   INR 1.16 0.00 - 1.49  Troponin I  Result Value Ref Range   Troponin I <0.03 <0.031 ng/mL  Troponin I  Result Value Ref Range   Troponin I <0.03 <0.031 ng/mL  CBC  Result Value Ref Range   WBC 6.2 4.0 - 10.5 K/uL   RBC 3.85 (L) 3.87 - 5.11 MIL/uL   Hemoglobin 11.2 (L) 12.0 - 15.0 g/dL   HCT 33.7 (L) 36.0 - 46.0 %   MCV 87.5 78.0 - 100.0 fL   MCH 29.1 26.0 - 34.0 pg   MCHC 33.2 30.0 - 36.0 g/dL   RDW 13.8 11.5 - 15.5 %   Platelets 200 150 - 400 K/uL  Potassium  Result Value  Ref Range   Potassium 3.4 (L) 3.5 - 5.1 mmol/L  Magnesium  Result Value Ref Range   Magnesium 2.0 1.7 - 2.4 mg/dL  Glucose, capillary  Result Value Ref Range   Glucose-Capillary 86 65 - 99 mg/dL  Heparin level (unfractionated)  Result Value Ref Range   Heparin Unfractionated 0.19 (L) 0.30 - 0.70 IU/mL  CBC  Result Value Ref Range   WBC 4.9 4.0 - 10.5 K/uL   RBC 3.80 (L) 3.87 - 5.11 MIL/uL   Hemoglobin 10.8 (L) 12.0 - 15.0 g/dL   HCT 33.1 (L) 36.0 - 46.0 %   MCV 87.1 78.0 - 100.0 fL   MCH 28.4 26.0 - 34.0 pg   MCHC 32.6 30.0 - 36.0 g/dL   RDW 13.7 11.5 - 15.5 %   Platelets 206 150 - 400 K/uL  Glucose, capillary  Result Value Ref Range   Glucose-Capillary 50 (L) 65 - 99 mg/dL  Glucose, capillary  Result Value Ref Range   Glucose-Capillary 96 65 - 99 mg/dL  Glucose, capillary  Result Value Ref Range   Glucose-Capillary 93 65 - 99 mg/dL  Glucose, capillary  Result Value Ref Range   Glucose-Capillary 87 65 - 99 mg/dL  Glucose, capillary  Result Value Ref Range   Glucose-Capillary 79 65 - 99 mg/dL   Meds ordered this encounter  Medications  . colchicine 0.6 MG tablet    Sig: Take 1 tablet (0.6 mg total) by mouth daily.    Dispense:  20 tablet    Refill:  0  . cephALEXin (KEFLEX) 500 MG capsule    Sig: Take 1 capsule (500 mg total) by mouth 3 (three) times daily.    Dispense:  21 capsule    Refill:  0      EKG/XRAY:   Primary read interpreted by Dr. Everlene Farrier at [facility]. There are degenerative changes of the tarsal bones. There also is a needle like area adjacent to the first and second toes which appears to be broken.   ASSESSMENT/PLAN: Patient does have a foreign body in the foot. I suspect this is old. Her symptoms and exam are consistent with gout. Will treat for gout at the present time. Recheck in 48-72 hours if not improving. We'll decrease Lipitor to half tablet a day while she is on colchicine with the foreign body in the foot I do think we should probably go ahead and cover her also with cephalexin twice a day in case the needle which seems to be present is getting infected. She does need to be seen in 48-72 hours.  Gross sideeffects, risk and benefits, and alternatives of medications d/w patient. Patient is aware that all medications have potential sideeffects and we are unable to predict every sideeffect or drug-drug interaction that may occur.  Arlyss Queen MD 06/17/2015 11:08 AM

## 2015-06-17 NOTE — Patient Instructions (Signed)
While you are on colchicine only take a half of Lipitor a day .Gout Gout is an inflammatory arthritis caused by a buildup of uric acid crystals in the joints. Uric acid is a chemical that is normally present in the blood. When the level of uric acid in the blood is too high it can form crystals that deposit in your joints and tissues. This causes joint redness, soreness, and swelling (inflammation). Repeat attacks are common. Over time, uric acid crystals can form into masses (tophi) near a joint, destroying bone and causing disfigurement. Gout is treatable and often preventable. CAUSES  The disease begins with elevated levels of uric acid in the blood. Uric acid is produced by your body when it breaks down a naturally found substance called purines. Certain foods you eat, such as meats and fish, contain high amounts of purines. Causes of an elevated uric acid level include:  Being passed down from parent to child (heredity).  Diseases that cause increased uric acid production (such as obesity, psoriasis, and certain cancers).  Excessive alcohol use.  Diet, especially diets rich in meat and seafood.  Medicines, including certain cancer-fighting medicines (chemotherapy), water pills (diuretics), and aspirin.  Chronic kidney disease. The kidneys are no longer able to remove uric acid well.  Problems with metabolism. Conditions strongly associated with gout include:  Obesity.  High blood pressure.  High cholesterol.  Diabetes. Not everyone with elevated uric acid levels gets gout. It is not understood why some people get gout and others do not. Surgery, joint injury, and eating too much of certain foods are some of the factors that can lead to gout attacks. SYMPTOMS   An attack of gout comes on quickly. It causes intense pain with redness, swelling, and warmth in a joint.  Fever can occur.  Often, only one joint is involved. Certain joints are more commonly involved:  Base of the  big toe.  Knee.  Ankle.  Wrist.  Finger. Without treatment, an attack usually goes away in a few days to weeks. Between attacks, you usually will not have symptoms, which is different from many other forms of arthritis. DIAGNOSIS  Your caregiver will suspect gout based on your symptoms and exam. In some cases, tests may be recommended. The tests may include:  Blood tests.  Urine tests.  X-rays.  Joint fluid exam. This exam requires a needle to remove fluid from the joint (arthrocentesis). Using a microscope, gout is confirmed when uric acid crystals are seen in the joint fluid. TREATMENT  There are two phases to gout treatment: treating the sudden onset (acute) attack and preventing attacks (prophylaxis).  Treatment of an Acute Attack.  Medicines are used. These include anti-inflammatory medicines or steroid medicines.  An injection of steroid medicine into the affected joint is sometimes necessary.  The painful joint is rested. Movement can worsen the arthritis.  You may use warm or cold treatments on painful joints, depending which works best for you.  Treatment to Prevent Attacks.  If you suffer from frequent gout attacks, your caregiver may advise preventive medicine. These medicines are started after the acute attack subsides. These medicines either help your kidneys eliminate uric acid from your body or decrease your uric acid production. You may need to stay on these medicines for a very long time.  The early phase of treatment with preventive medicine can be associated with an increase in acute gout attacks. For this reason, during the first few months of treatment, your caregiver may also advise  you to take medicines usually used for acute gout treatment. Be sure you understand your caregiver's directions. Your caregiver may make several adjustments to your medicine dose before these medicines are effective.  Discuss dietary treatment with your caregiver or dietitian.  Alcohol and drinks high in sugar and fructose and foods such as meat, poultry, and seafood can increase uric acid levels. Your caregiver or dietitian can advise you on drinks and foods that should be limited. HOME CARE INSTRUCTIONS   Do not take aspirin to relieve pain. This raises uric acid levels.  Only take over-the-counter or prescription medicines for pain, discomfort, or fever as directed by your caregiver.  Rest the joint as much as possible. When in bed, keep sheets and blankets off painful areas.  Keep the affected joint raised (elevated).  Apply warm or cold treatments to painful joints. Use of warm or cold treatments depends on which works best for you.  Use crutches if the painful joint is in your leg.  Drink enough fluids to keep your urine clear or pale yellow. This helps your body get rid of uric acid. Limit alcohol, sugary drinks, and fructose drinks.  Follow your dietary instructions. Pay careful attention to the amount of protein you eat. Your daily diet should emphasize fruits, vegetables, whole grains, and fat-free or low-fat milk products. Discuss the use of coffee, vitamin C, and cherries with your caregiver or dietitian. These may be helpful in lowering uric acid levels.  Maintain a healthy body weight. SEEK MEDICAL CARE IF:   You develop diarrhea, vomiting, or any side effects from medicines.  You do not feel better in 24 hours, or you are getting worse. SEEK IMMEDIATE MEDICAL CARE IF:   Your joint becomes suddenly more tender, and you have chills or a fever. MAKE SURE YOU:   Understand these instructions.  Will watch your condition.  Will get help right away if you are not doing well or get worse. Document Released: 11/22/2000 Document Revised: 04/11/2014 Document Reviewed: 07/08/2012 [facility] Patient Information 2015 [place], [place]. This information is not intended to replace advice given to you by your health care provider. Make sure you discuss any  questions you have with your health care provider.

## 2015-06-19 ENCOUNTER — Telehealth: Payer: Self-pay

## 2015-06-19 ENCOUNTER — Telehealth: Payer: Self-pay | Admitting: Internal Medicine

## 2015-06-19 NOTE — Telephone Encounter (Signed)
Patient is returning a missed phone call. Please call! [phone]

## 2015-06-19 NOTE — Telephone Encounter (Signed)
ERROR

## 2015-06-21 ENCOUNTER — Ambulatory Visit (INDEPENDENT_AMBULATORY_CARE_PROVIDER_SITE_OTHER): Payer: Medicare Other | Admitting: Emergency Medicine

## 2015-06-21 VITALS — BP 110/62 | HR 71 | Temp 98.0°F | Resp 16 | Ht 62.0 in | Wt 170.6 lb

## 2015-06-21 DIAGNOSIS — Z23 Encounter for immunization: Secondary | ICD-10-CM | POA: Diagnosis not present

## 2015-06-21 DIAGNOSIS — S90851D Superficial foreign body, right foot, subsequent encounter: Secondary | ICD-10-CM

## 2015-06-21 DIAGNOSIS — M1000 Idiopathic gout, unspecified site: Secondary | ICD-10-CM

## 2015-06-21 DIAGNOSIS — L03031 Cellulitis of right toe: Secondary | ICD-10-CM | POA: Diagnosis not present

## 2015-06-21 NOTE — Progress Notes (Addendum)
   Subjective:    Patient ID: LETA BUCKLIN, female    DOB: [birth year], 81 y.o.   MRN: [id number] This chart was scribed for Arlyss Queen, MD by Zola Button, Medical Scribe. This patient was seen in room 2 and the patient's care was started at 8:32 AM.   HPI HPI Comments: Chelsea Gallegos is a 81 y.o. female who presents to the [facility] for a follow-up for right foot swelling. Patient was seen by me 4 days ago for this. XR imaging of her foot at that time revealed a needle like area adjacent to the first and second toes which appears to be broken. She was treated for gout at that time and was prescribed colchicine. She was also given cephalexin to cover for possible infection due to the needle. It is unknown how long the needle has been in her foot. Since the last visit, the swelling has decreased. Patient has been taking all of her medications. At her visit with Dr. Terrence Dupont last month, she was told to follow-up in 3 months.   Review of Systems  Musculoskeletal: Positive for joint swelling and arthralgias.       Objective:   Physical Exam CONSTITUTIONAL: Well developed/well nourished HEAD: Normocephalic/atraumatic EYES: EOM/PERRL ENMT: Mucous membranes moist NECK: supple no meningeal signs SPINE: entire spine nontender CV: S1/S2 noted, no murmurs/rubs/gallops noted. Regular rate and rhythm. LUNGS: Lungs are clear to auscultation bilaterally, no apparent distress ABDOMEN: soft, nontender, no rebound or guarding GU: no cva tenderness NEURO: Pt is awake/alert, moves all extremitiesx4 EXTREMITIES: pulses normal, full ROM. Tenderness and swelling over the right great toe. Tenderness over the distal dorsum of the right foot. Decreased swelling and decreased redness from previously. SKIN: warm, color normal PSYCH: no abnormalities of mood noted        Assessment & Plan:  Her foot is definitely better. The prescription on her colchicine was to be taken 1 a day  however it should have been twice a day. I instructed her to take her colchicine twice a day. I do not think her foot problem is related to the foreign body in her foot. I will recheck her on Monday. She is to continue to rest and keep her leg elevated and continue her postop shoe.I personally performed the services described in this documentation, which was scribed in my presence. The recorded information has been reviewed and is accurate. Tetanus shot was given today  Nena Jordan, MD

## 2015-06-21 NOTE — Patient Instructions (Addendum)
Continue your Keflex three times a day. Increase your colchicine to twice a day. Recheck your foot on Monday.  Gout Gout is an inflammatory arthritis caused by a buildup of uric acid crystals in the joints. Uric acid is a chemical that is normally present in the blood. When the level of uric acid in the blood is too high it can form crystals that deposit in your joints and tissues. This causes joint redness, soreness, and swelling (inflammation). Repeat attacks are common. Over time, uric acid crystals can form into masses (tophi) near a joint, destroying bone and causing disfigurement. Gout is treatable and often preventable. CAUSES  The disease begins with elevated levels of uric acid in the blood. Uric acid is produced by your body when it breaks down a naturally found substance called purines. Certain foods you eat, such as meats and fish, contain high amounts of purines. Causes of an elevated uric acid level include:  Being passed down from parent to child (heredity).  Diseases that cause increased uric acid production (such as obesity, psoriasis, and certain cancers).  Excessive alcohol use.  Diet, especially diets rich in meat and seafood.  Medicines, including certain cancer-fighting medicines (chemotherapy), water pills (diuretics), and aspirin.  Chronic kidney disease. The kidneys are no longer able to remove uric acid well.  Problems with metabolism. Conditions strongly associated with gout include:  Obesity.  High blood pressure.  High cholesterol.  Diabetes. Not everyone with elevated uric acid levels gets gout. It is not understood why some people get gout and others do not. Surgery, joint injury, and eating too much of certain foods are some of the factors that can lead to gout attacks. SYMPTOMS   An attack of gout comes on quickly. It causes intense pain with redness, swelling, and warmth in a joint.  Fever can occur.  Often, only one joint is involved. Certain  joints are more commonly involved:  Base of the big toe.  Knee.  Ankle.  Wrist.  Finger. Without treatment, an attack usually goes away in a few days to weeks. Between attacks, you usually will not have symptoms, which is different from many other forms of arthritis. DIAGNOSIS  Your caregiver will suspect gout based on your symptoms and exam. In some cases, tests may be recommended. The tests may include:  Blood tests.  Urine tests.  X-rays.  Joint fluid exam. This exam requires a needle to remove fluid from the joint (arthrocentesis). Using a microscope, gout is confirmed when uric acid crystals are seen in the joint fluid. TREATMENT  There are two phases to gout treatment: treating the sudden onset (acute) attack and preventing attacks (prophylaxis).  Treatment of an Acute Attack.  Medicines are used. These include anti-inflammatory medicines or steroid medicines.  An injection of steroid medicine into the affected joint is sometimes necessary.  The painful joint is rested. Movement can worsen the arthritis.  You may use warm or cold treatments on painful joints, depending which works best for you.  Treatment to Prevent Attacks.  If you suffer from frequent gout attacks, your caregiver may advise preventive medicine. These medicines are started after the acute attack subsides. These medicines either help your kidneys eliminate uric acid from your body or decrease your uric acid production. You may need to stay on these medicines for a very long time.  The early phase of treatment with preventive medicine can be associated with an increase in acute gout attacks. For this reason, during the first few months  of treatment, your caregiver may also advise you to take medicines usually used for acute gout treatment. Be sure you understand your caregiver's directions. Your caregiver may make several adjustments to your medicine dose before these medicines are effective.  Discuss  dietary treatment with your caregiver or dietitian. Alcohol and drinks high in sugar and fructose and foods such as meat, poultry, and seafood can increase uric acid levels. Your caregiver or dietitian can advise you on drinks and foods that should be limited. HOME CARE INSTRUCTIONS   Do not take aspirin to relieve pain. This raises uric acid levels.  Only take over-the-counter or prescription medicines for pain, discomfort, or fever as directed by your caregiver.  Rest the joint as much as possible. When in bed, keep sheets and blankets off painful areas.  Keep the affected joint raised (elevated).  Apply warm or cold treatments to painful joints. Use of warm or cold treatments depends on which works best for you.  Use crutches if the painful joint is in your leg.  Drink enough fluids to keep your urine clear or pale yellow. This helps your body get rid of uric acid. Limit alcohol, sugary drinks, and fructose drinks.  Follow your dietary instructions. Pay careful attention to the amount of protein you eat. Your daily diet should emphasize fruits, vegetables, whole grains, and fat-free or low-fat milk products. Discuss the use of coffee, vitamin C, and cherries with your caregiver or dietitian. These may be helpful in lowering uric acid levels.  Maintain a healthy body weight. SEEK MEDICAL CARE IF:   You develop diarrhea, vomiting, or any side effects from medicines.  You do not feel better in 24 hours, or you are getting worse. SEEK IMMEDIATE MEDICAL CARE IF:   Your joint becomes suddenly more tender, and you have chills or a fever. MAKE SURE YOU:   Understand these instructions.  Will watch your condition.  Will get help right away if you are not doing well or get worse. Document Released: 11/22/2000 Document Revised: 04/11/2014 Document Reviewed: 07/08/2012 [facility] Patient Information 2015 [place], [place]. This information is not intended to replace advice given to you by your  health care provider. Make sure you discuss any questions you have with your health care provider.

## 2015-06-21 NOTE — Progress Notes (Deleted)
   Subjective:    Patient ID: Chelsea Gallegos, female    DOB: Feb 08, 1934, 81 y.o.   MRN: [id number]  HPI    Review of Systems     Objective:   Physical Exam        Assessment & Plan:

## 2015-06-28 ENCOUNTER — Ambulatory Visit (INDEPENDENT_AMBULATORY_CARE_PROVIDER_SITE_OTHER): Payer: Medicare Other | Admitting: Emergency Medicine

## 2015-06-28 VITALS — BP 104/68 | HR 62 | Temp 97.9°F | Resp 16 | Ht 62.0 in | Wt 170.8 lb

## 2015-06-28 DIAGNOSIS — M25571 Pain in right ankle and joints of right foot: Secondary | ICD-10-CM

## 2015-06-28 DIAGNOSIS — S90851D Superficial foreign body, right foot, subsequent encounter: Secondary | ICD-10-CM

## 2015-06-28 DIAGNOSIS — M1000 Idiopathic gout, unspecified site: Secondary | ICD-10-CM

## 2015-06-28 NOTE — Patient Instructions (Signed)

## 2015-06-28 NOTE — Progress Notes (Signed)
   Subjective:    Patient ID: Chelsea Gallegos, female    DOB: [birth year], 81 y.o.   MRN: [id number] This chart was scribed for Arlyss Queen, MD by Zola Button, Medical Scribe. This patient was seen in room 1 and the patient's care was started at 8:40 AM.   HPI HPI Comments: Chelsea Gallegos is a 81 y.o. female who presents to the [facility] for a follow-up for right foot pain and swelling. Patient was seen by me 1 week ago and was treated for gout. Her right foot pain and swelling have largely resolved.     Review of Systems  Musculoskeletal: Positive for joint swelling. Negative for arthralgias.       Objective:   Physical Exam CONSTITUTIONAL: Well developed/well nourished HEAD: Normocephalic/atraumatic EYES: EOM/PERRL ENMT: Mucous membranes moist NECK: supple no meningeal signs SPINE: entire spine nontender CV: S1/S2 noted, no murmurs/rubs/gallops noted LUNGS: Lungs are clear to auscultation bilaterally, no apparent distress ABDOMEN: soft, nontender, no rebound or guarding GU: no cva tenderness NEURO: Pt is awake/alert, moves all extremitiesx4 EXTREMITIES: pulses normal, full ROM. Previously noted swelling and redness over the foot is now resolved. No tenderness between the 1st and 2nd MTP joints. SKIN: warm, color normal PSYCH: no abnormalities of mood noted        Assessment & Plan:  I suspect this has been an acute flare of gout. She was given information about gout. She declines a referral to orthopedics at the present time. She can be off her medications.I personally performed the services described in this documentation, which was scribed in my presence. The recorded information has been reviewed and is accurate.  Nena Jordan, MD

## 2015-09-12 ENCOUNTER — Encounter: Payer: Self-pay | Admitting: Emergency Medicine

## 2016-04-23 ENCOUNTER — Telehealth: Payer: Self-pay | Admitting: Family Medicine

## 2016-12-09 HISTORY — PX: CATARACT EXTRACTION, BILATERAL: SHX1313

## 2016-12-09 HISTORY — PX: GLAUCOMA SURGERY: SHX656

## 2017-01-07 ENCOUNTER — Ambulatory Visit (INDEPENDENT_AMBULATORY_CARE_PROVIDER_SITE_OTHER): Payer: Medicare Other

## 2017-01-07 ENCOUNTER — Ambulatory Visit (INDEPENDENT_AMBULATORY_CARE_PROVIDER_SITE_OTHER): Payer: Medicare Other | Admitting: Student

## 2017-01-07 VITALS — BP 122/72 | HR 75 | Temp 98.2°F | Resp 17 | Ht 62.5 in | Wt 178.0 lb

## 2017-01-07 DIAGNOSIS — I480 Paroxysmal atrial fibrillation: Secondary | ICD-10-CM

## 2017-01-07 DIAGNOSIS — M25571 Pain in right ankle and joints of right foot: Secondary | ICD-10-CM | POA: Diagnosis not present

## 2017-01-07 DIAGNOSIS — I10 Essential (primary) hypertension: Secondary | ICD-10-CM | POA: Diagnosis not present

## 2017-01-07 DIAGNOSIS — M109 Gout, unspecified: Secondary | ICD-10-CM | POA: Diagnosis not present

## 2017-01-07 IMAGING — DX DG ANKLE COMPLETE 3+V*R*
3 series · 3 of 3 positions shown · non-contrast
Comparison: [DATE]

CLINICAL DATA: Fall 1 month ago with persistent ankle pain, initial
encounter

EXAM:
RIGHT ANKLE - COMPLETE 3+ VIEW

[ankle ap]
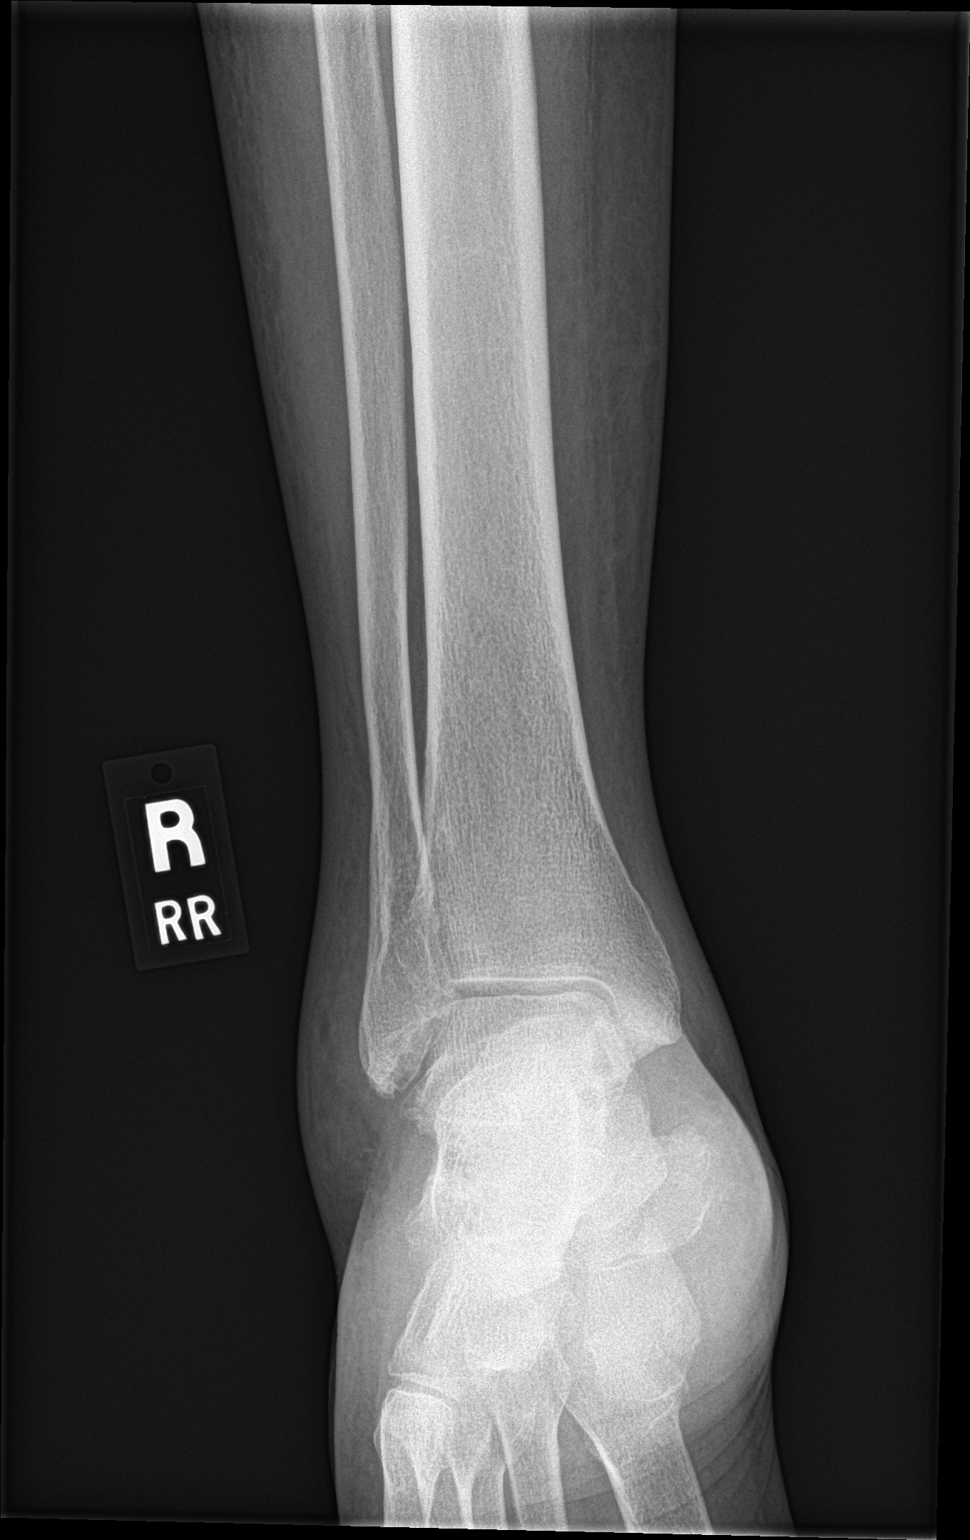

[ankle obl]
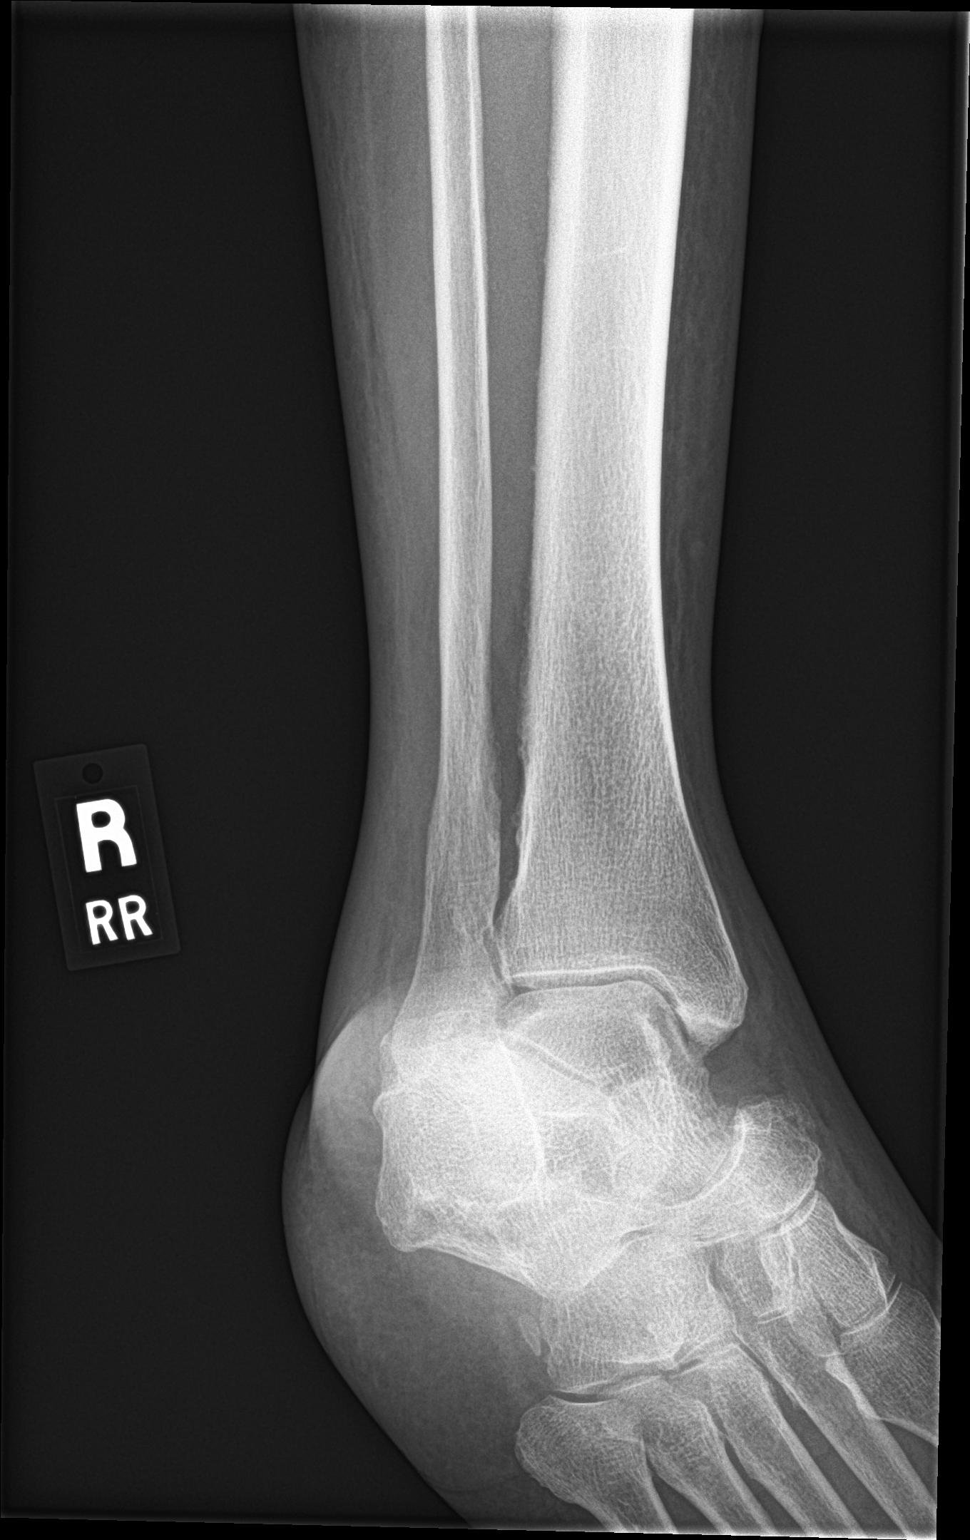

[ankle lat]
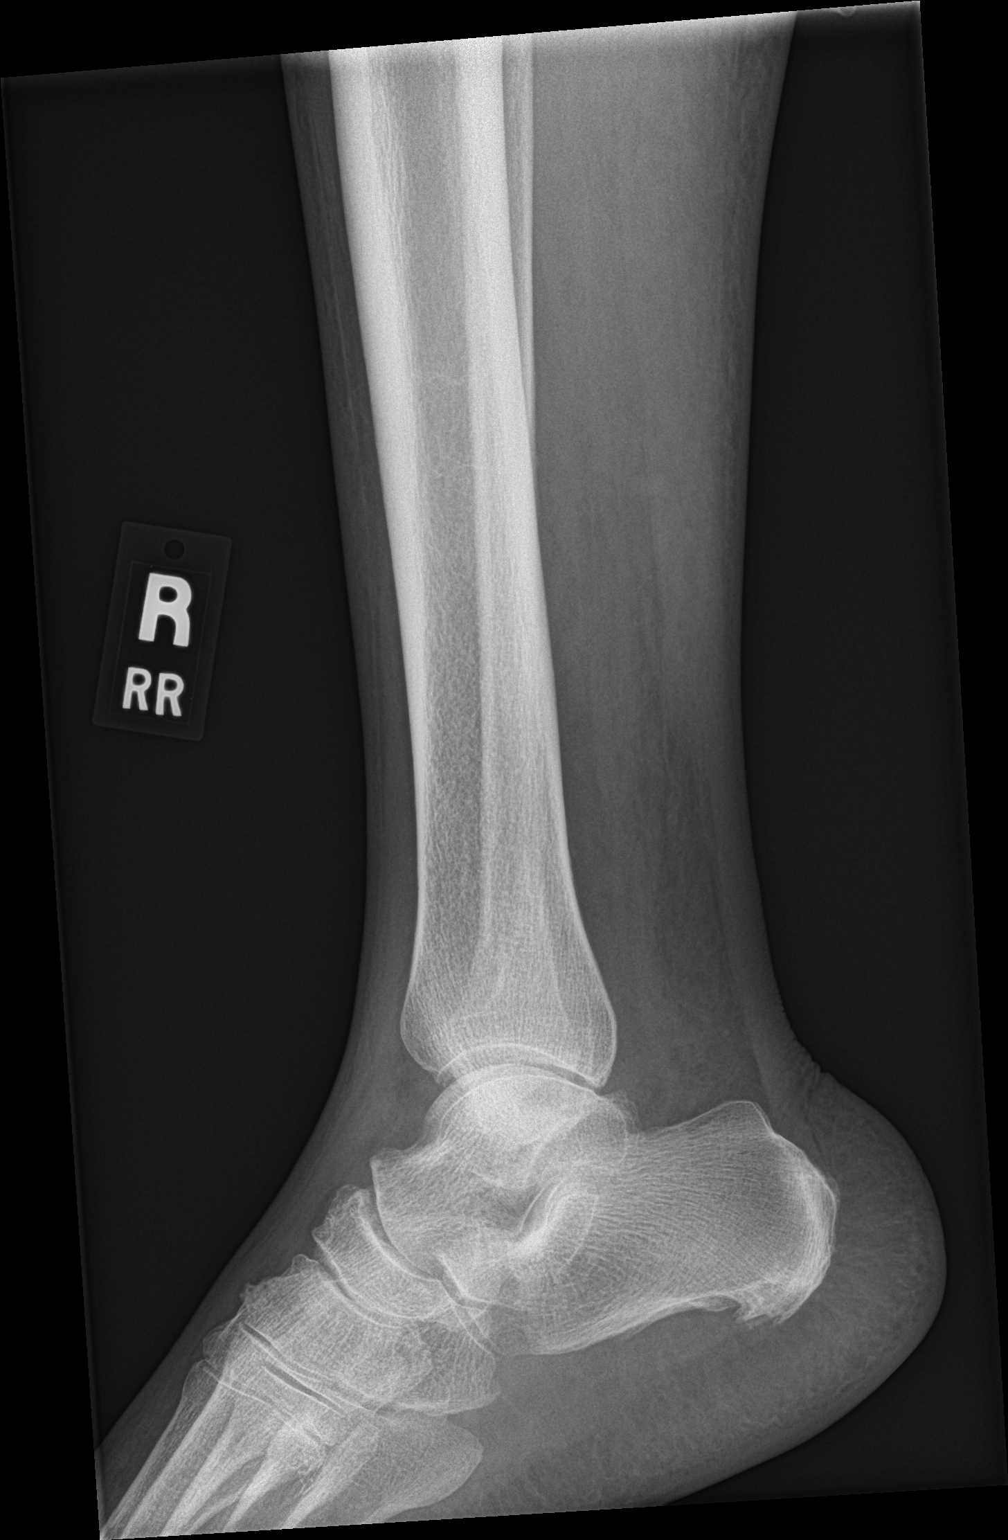

[3 of 3 positions shown; findings below may reference images not displayed]

FINDINGS: Significant soft tissue swelling is noted about the ankle
particularly laterally. No acute fracture or dislocation is seen.
Tarsal degenerative changes are noted. Small plantar calcaneal spurs
are seen.
IMPRESSION: Soft tissue swelling without acute bony abnormality.

## 2017-01-07 MED ORDER — COLCHICINE 0.6 MG PO TABS: ORAL_TABLET | ORAL | 1 refills | Status: DC

## 2017-01-07 NOTE — Patient Instructions (Addendum)
Please take 2 tablets and then 1 tablet 2 hours later.  Starting tomorrow, you can have 1 tablet twice daily until symptoms gone.  Will call if bloodwork abnormal.    IF you received an x-ray today, you will receive an invoice from [facility]. Please contact [facility] at [phone] with questions or concerns regarding your invoice.   IF you received labwork today, you will receive an invoice from [facility]. Please contact [facility] at [phone] with questions or concerns regarding your invoice.   Our billing staff will not be able to assist you with questions regarding bills from these companies.  You will be contacted with the lab results as soon as they are available. The fastest way to get your results is to activate your My Chart account. Instructions are located on the last page of this paperwork. If you have not heard from [place] regarding the results in 2 weeks, please contact this office.

## 2017-01-07 NOTE — Progress Notes (Signed)
  Chelsea Gallegos - 83 y.o. female MRN [id number]  Date of birth: May 15, 1934  SUBJECTIVE:  Including CC & ROS.  CC: right ankle pain  Complains of right ankle pain that has been ongoing for past 1 month on and off.  Reports that she hurt it in the shower last month.  Does not remember inverting it, but does not recall mechanism.  Notices swelling and warmth to area.  Has been ambulating without too much difficulty, just reports pain.  Denies numbness, tingling distally.  She has a h/o HTN, DM, atrial fibrillation, but has not been taking medications because she feels fine without them.  Denies chest pain, shortness of breath, palpitations, fatigue.   ROS: No unexpected weight loss, fever, chills, swelling, instability, muscle pain, numbness/tingling, redness, otherwise see HPI   PMHx - Updated and reviewed.  Contributory factors include: Gout, HTN, DM, HLD, atrial fibrillation PSHx - Updated and reviewed.  Contributory factors include:  Negative FHx - Updated and reviewed.  Contributory factors include:  Negative Social Hx - Updated and reviewed. Contributory factors include: Negative Medications - reviewed   DATA REVIEWED: Previous office visits, ED visit in 2016, which showed that she was in atrial fibrillation  PHYSICAL EXAM:  VS: BP:122/72  HR:75bpm  TEMP:98.2 F (36.8 C)(Oral)  RESP:97 %  HT:5' 2.5" (158.8 cm)   WT:178 lb (80.7 kg)  BMI:32.1 PHYSICAL EXAM: Gen: NAD, alert, cooperative with exam, well-appearing HEENT: clear conjunctiva,  CV:  no edema, capillary refill brisk, normal rate Resp: non-labored Skin: no rashes, normal turgor  Neuro: no gross deficits.  Psych:  alert and oriented  Ankle & Foot: Swelling to ankle joint present with slight warmth.  No erythema.  No ecchymosis present TTP over lateral malleolus diffusely.  No TTP over ligaments of lateral ankle.    Achilles tendon without nodules or tenderness No swelling of retrocalcaneal bursa No pain at MT  heads No pain at base of 5th MT; No tenderness over cuboid; No tenderness over N spot or navicular prominence No sign of peroneal tendon subluxations or tenderness to palpation Full in plantarflexion, dorsiflexion, inversion, and eversion of the foot; flexion and extension of the toes Strength: 5/5 in all directions. Sensation: intact Vascular: intact w/ dorsalis pedis & posterior tibialis pulses 2+ Stable lateral and medial ligaments; Negative Anterior drawer test    ASSESSMENT & PLAN:   Acute gout of right ankle Will treat with colchicine.  Follow up if not improved.  X-rays show swelling, but no fracture.  Hypertension Checked BMP and BP within normal limits.  Continue diet control.    Paroxysmal atrial fibrillation (HCC) Does have a history of a. Fib.  Appears to be in regular rhythm.  Did counsel that she may come in and out of atrial fibrillation and should take a blood thinner in case.  Reviewed risks/benefits of anticoagulation.  She agreed to take a baby aspirin.  She reports risk of falling.  Signed,  Balinda Quails, DO [facility] 6:33 PM 01/08/17

## 2017-01-07 NOTE — Assessment & Plan Note (Signed)
Will treat with colchicine.  Follow up if not improved.  X-rays show swelling, but no fracture.

## 2017-01-08 DIAGNOSIS — I480 Paroxysmal atrial fibrillation: Secondary | ICD-10-CM | POA: Insufficient documentation

## 2017-01-08 LAB — BASIC METABOLIC PANEL
BUN/Creatinine Ratio: 14 (ref 12–28)
BUN: 10 mg/dL (ref 8–27)
CALCIUM: 8.6 mg/dL — AB (ref 8.7–10.3)
CO2: 24 mmol/L (ref 18–29)
Chloride: 104 mmol/L (ref 96–106)
Creatinine, Ser: 0.72 mg/dL (ref 0.57–1.00)
GFR, EST AFRICAN AMERICAN: 91 mL/min/{1.73_m2} (ref >59)
GFR, EST NON AFRICAN AMERICAN: 79 mL/min/{1.73_m2} (ref >59)
Glucose: 169 mg/dL — ABNORMAL HIGH (ref 65–99)
POTASSIUM: 3.5 mmol/L (ref 3.5–5.2)
Sodium: 144 mmol/L (ref 134–144)

## 2017-01-08 NOTE — Assessment & Plan Note (Signed)
Does have a history of a. Fib.  Appears to be in regular rhythm.  Did counsel that she may come in and out of atrial fibrillation and should take a blood thinner in case.  Reviewed risks/benefits of anticoagulation.  She agreed to take a baby aspirin.  She reports risk of falling.

## 2017-01-08 NOTE — Assessment & Plan Note (Signed)
Checked BMP and BP within normal limits.  Continue diet control.

## 2017-04-08 DIAGNOSIS — I639 Cerebral infarction, unspecified: Secondary | ICD-10-CM

## 2017-04-08 HISTORY — DX: Cerebral infarction, unspecified: I63.9

## 2017-09-26 ENCOUNTER — Ambulatory Visit (INDEPENDENT_AMBULATORY_CARE_PROVIDER_SITE_OTHER): Payer: Medicare Other

## 2017-09-26 ENCOUNTER — Encounter: Payer: Self-pay | Admitting: Family Medicine

## 2017-09-26 ENCOUNTER — Ambulatory Visit (INDEPENDENT_AMBULATORY_CARE_PROVIDER_SITE_OTHER): Payer: Medicare Other | Admitting: Family Medicine

## 2017-09-26 VITALS — BP 114/62 | HR 84 | Temp 98.3°F | Resp 18 | Ht 62.99 in | Wt 165.6 lb

## 2017-09-26 DIAGNOSIS — M25522 Pain in left elbow: Secondary | ICD-10-CM | POA: Diagnosis not present

## 2017-09-26 IMAGING — DX DG ELBOW COMPLETE 3+V*L*
4 series · 4 of 4 positions shown · non-contrast
Comparison: None.

CLINICAL DATA: 82-year-old female with 4 days of left elbow pain
with difficulty with extension. No trauma. Initial encounter.

EXAM:
LEFT ELBOW - COMPLETE 3+ VIEW

[elbow ap]
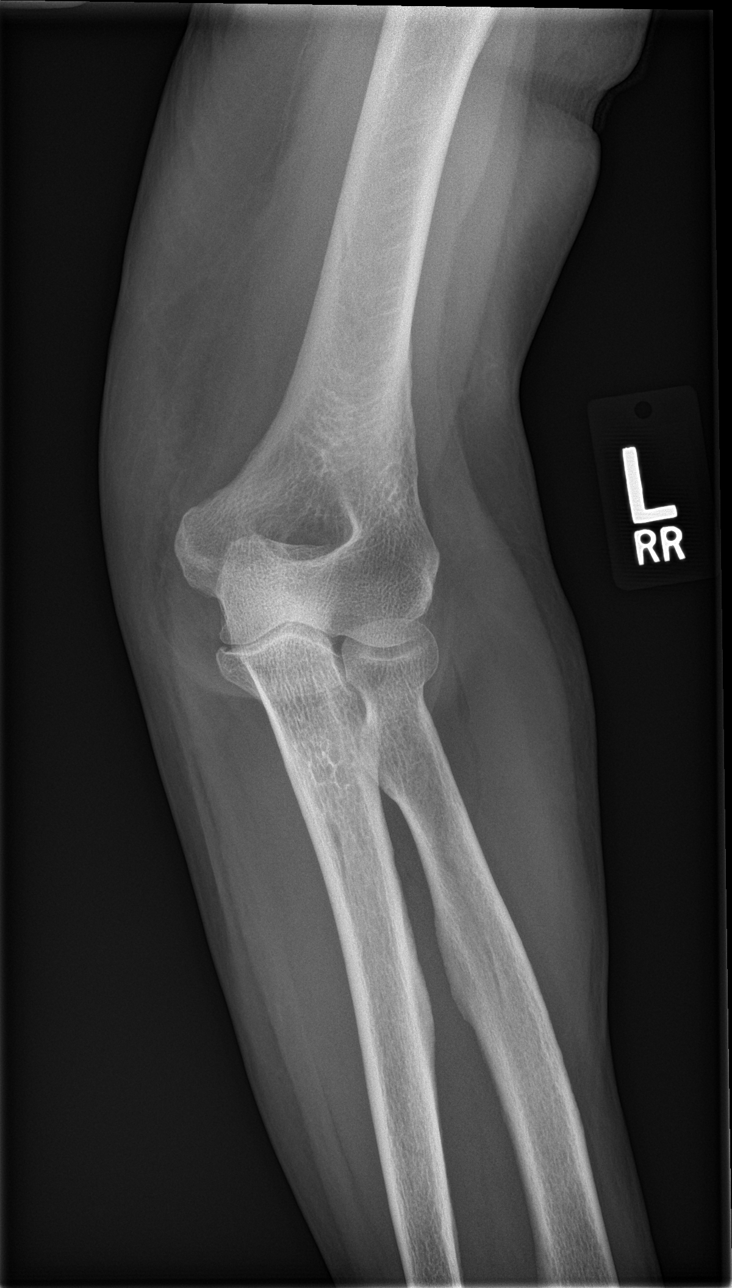

[elbow obl (1 of 2)]
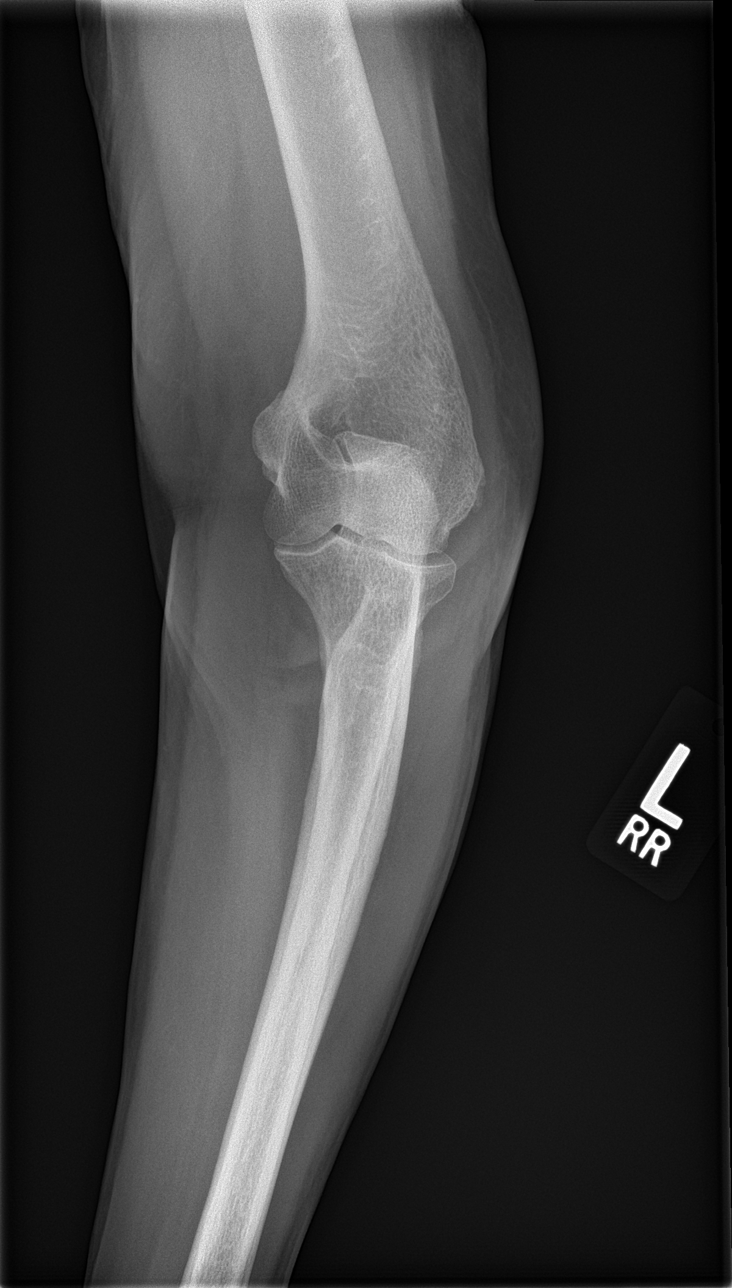

[elbow obl (2 of 2)]
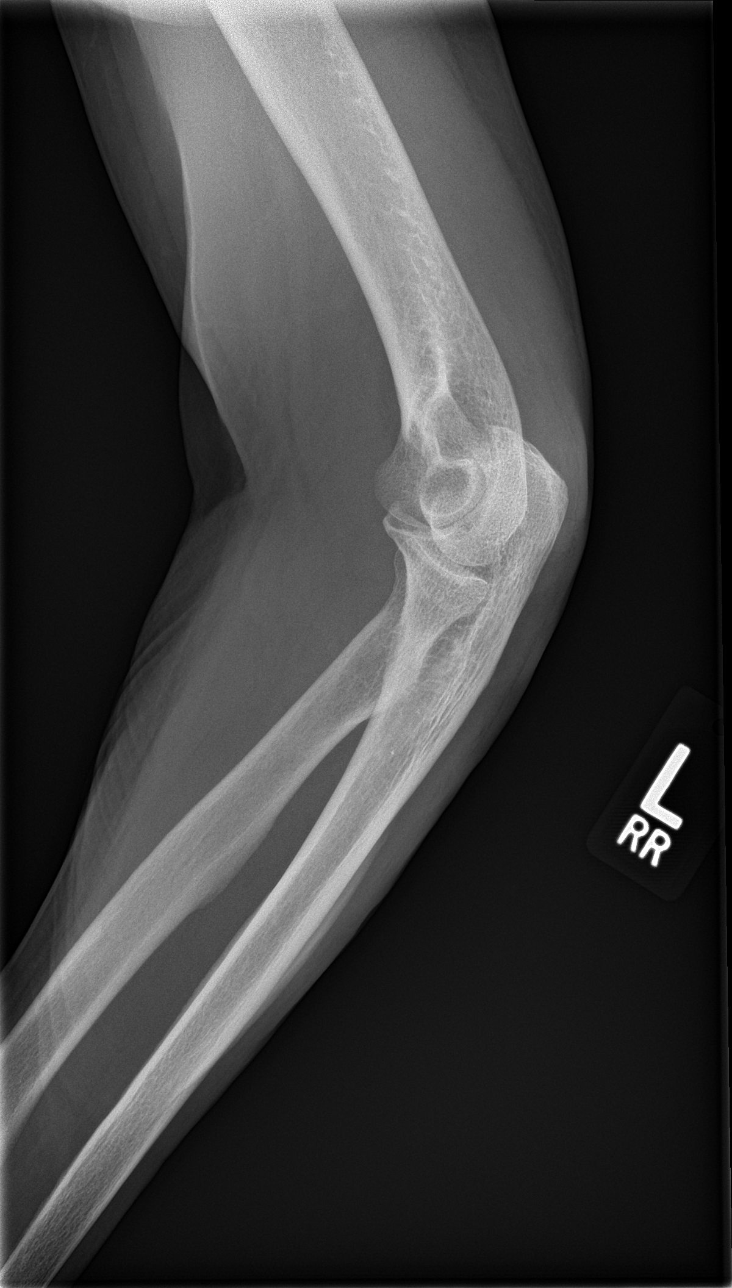

[elbow lat]
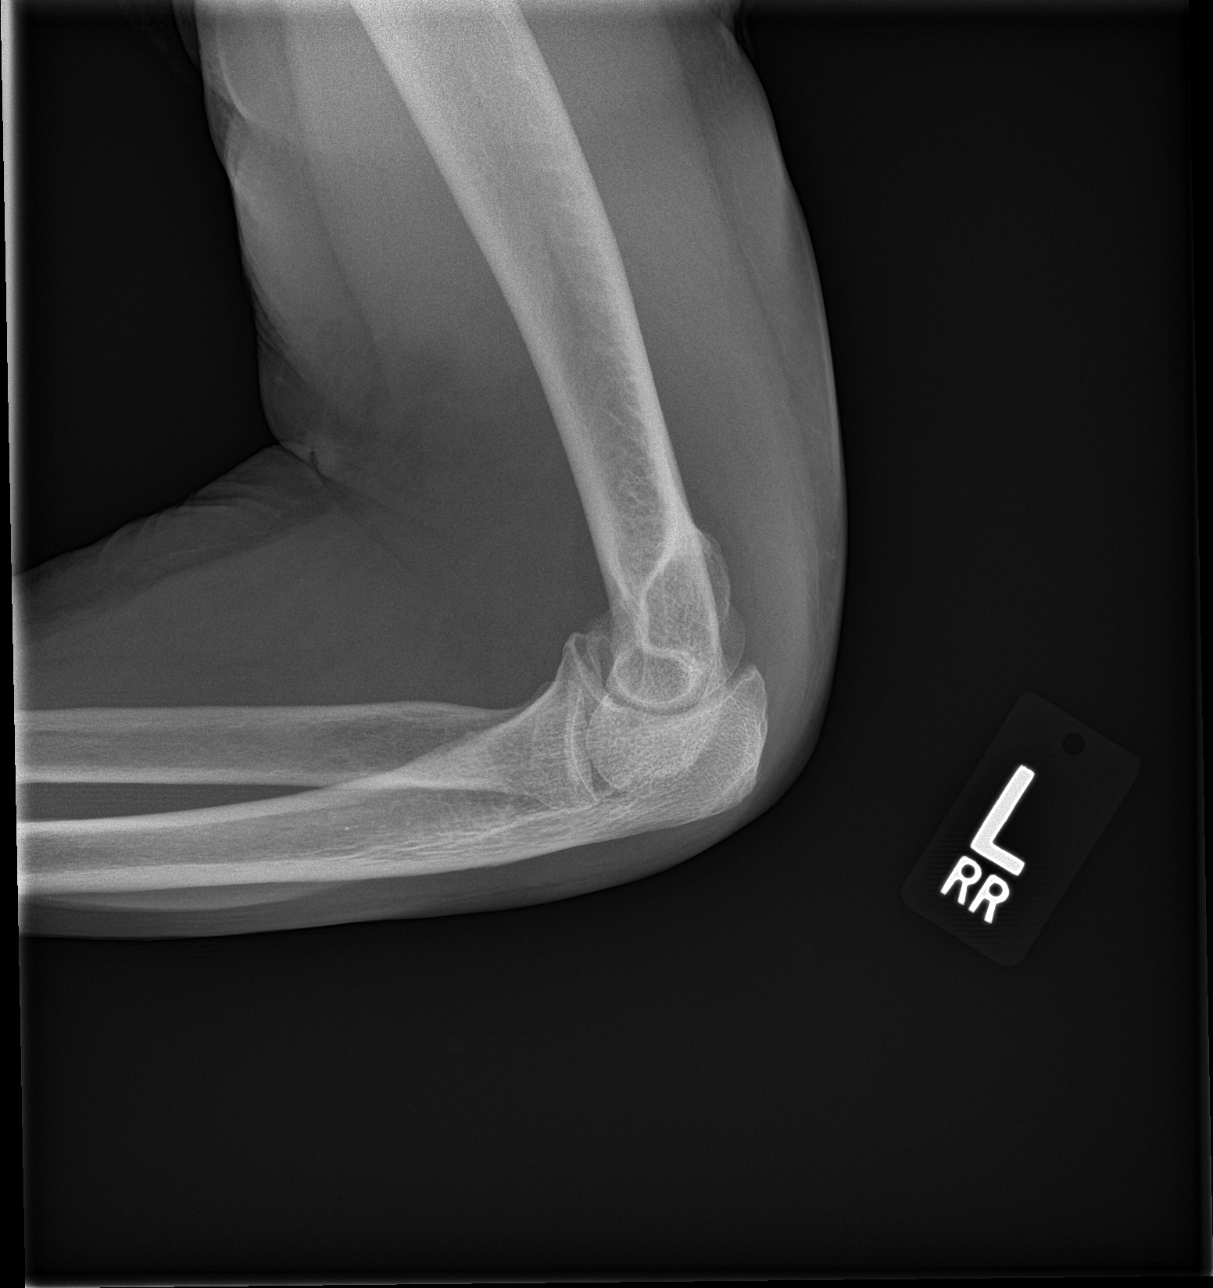

[4 of 4 positions shown; findings below may reference images not displayed]

FINDINGS: No fracture or dislocation.

Minimal degenerative changes olecranon/ trochlea articulation

Small joint effusion suspected.
IMPRESSION: Minimal degenerative changes olecranon/ trochlea articulation

Small joint effusion suspected.

No fracture detected.

## 2017-09-26 MED ORDER — IBUPROFEN 600 MG PO TABS: 600.0000 mg | ORAL_TABLET | Freq: Three times a day (TID) | ORAL | 0 refills | Status: DC | PRN

## 2017-09-26 NOTE — Patient Instructions (Signed)
     IF you received an x-ray today, you will receive an invoice from [facility]. Please contact  Radiology at [phone] with questions or concerns regarding your invoice.   IF you received labwork today, you will receive an invoice from [facility]. Please contact [facility] at [phone] with questions or concerns regarding your invoice.   Our billing staff will not be able to assist you with questions regarding bills from these companies.  You will be contacted with the lab results as soon as they are available. The fastest way to get your results is to activate your My Chart account. Instructions are located on the last page of this paperwork. If you have not heard from us regarding the results in 2 weeks, please contact this office.     

## 2017-09-26 NOTE — Progress Notes (Signed)
   10/19/201811:14 AM  Chelsea Gallegos [birth year], 83 y.o. female [id number]  Chief Complaint  Patient presents with  . Arm Pain    X 4 days- left arm    HPI:   Patient is a 83 y.o. 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 She reports pain is mostly medial, cant straighten out her arm. Tylenol 500mg  not helping. She denies any swelling or redness. She denies any previous trauma. She denies any numbness, tingling, weakness.  Depression screen [facility] 2/9 09/26/2017 01/07/2017 06/28/2015  Decreased Interest 0 0 0  Down, Depressed, Hopeless 0 0 1  PHQ - 2 Score 0 0 1    No Known Allergies  Prior to Admission medications   Medication Sig Start Date End Date Taking? Authorizing Provider  colchicine 0.6 MG tablet Take 2 tablet PO once and then another tablet 2 hours later.  Then, take 1 tablet PO BID PRN for gout 01/07/17  Yes Chitanand, Deirdre Evener, DO    Past Medical History:  Diagnosis Date  . Arthritis   . Asthma   . Diabetes mellitus   . GERD (gastroesophageal reflux disease)   . Hyperlipidemia ~2005  . Hypertension     Past Surgical History:  Procedure Laterality Date  . CHOLECYSTECTOMY  30 yrs ago  . EYE SURGERY    . FRACTURE SURGERY  ~2006   Rt knee  . HERNIA REPAIR  ~5 yrs ago   umbilical    Social History  Substance Use Topics  . Smoking status: Former Smoker    Quit date: 12/09/1972  . Smokeless tobacco: Never Used  . Alcohol use Yes     Comment: occasionally    History reviewed. No pertinent family history.  ROS Per HPI  OBJECTIVE:  Blood pressure 114/62, pulse 84, temperature 98.3 F (36.8 C), temperature source Oral, resp. rate 18, height 5' 2.99" (1.6 m), weight 165 lb 9.6 oz (75.1 kg), SpO2 98 %.  Physical Exam  Gen: AAOx3, NAD MSK: Left shoulder and wrist FROM, elbow decreased extension and supination ROM, minimal swelling, TTP along medial epicondyle. No  erythema. LUE neurovascularly intact.     Dg Elbow Complete Left  Result Date: 09/26/2017 CLINICAL DATA:  83 year old female with 4 days of left elbow pain with difficulty with extension. No trauma. Initial encounter. EXAM: LEFT ELBOW - COMPLETE 3+ VIEW COMPARISON:  None. FINDINGS: No fracture or dislocation. Minimal degenerative changes olecranon/ trochlea articulation Small joint effusion suspected. IMPRESSION: Minimal degenerative changes olecranon/ trochlea articulation Small joint effusion suspected. No fracture detected. Electronically Signed   By: Genia Del M.D.   On: 09/26/2017 09:29     ASSESSMENT and PLAN 1. Left elbow pain - DG Elbow Complete Left; Future - mild OA, no fracture. Discussed conservative measures. Patient educational handouts with exercises for elbow strain given. RTC precautions discussed.   - ibuprofen (ADVIL,MOTRIN) 600 MG tablet; Take 1 tablet (600 mg total) by mouth every 8 (eight) hours as needed.  Return if symptoms worsen or fail to improve.    Rutherford Guys, MD [facility] [place], [place] [address] Ph.  [phone] Fax [phone]

## 2018-02-27 ENCOUNTER — Encounter: Payer: Self-pay | Admitting: Urgent Care

## 2018-02-27 ENCOUNTER — Ambulatory Visit: Payer: Medicare Other | Admitting: Urgent Care

## 2018-02-27 VITALS — BP 122/72 | HR 60 | Temp 97.7°F | Resp 18 | Ht 62.0 in | Wt 166.4 lb

## 2018-02-27 DIAGNOSIS — D229 Melanocytic nevi, unspecified: Secondary | ICD-10-CM | POA: Diagnosis not present

## 2018-02-27 NOTE — Progress Notes (Signed)
   MRN: [id number] DOB: 13-Jul-1934  Subjective:   Chelsea Gallegos is a 84 y.o. female presenting to discuss removal of moles of her face. Patient reports that she has had them for years and they bother her when she tries to put make up on. She has previously had 1 skin lesion removed from her face years ago.   Opha currently has no medications in their medication list. Also has No Known Allergies.  Jennelle  has a past medical history of Arthritis, Asthma, Diabetes mellitus, GERD (gastroesophageal reflux disease), Hyperlipidemia (~2005), and Hypertension. Also  has a past surgical history that includes Cholecystectomy (30 yrs ago); Hernia repair (~5 yrs ago); Fracture surgery (~2006); and Eye surgery.  Objective:   Vitals: BP 122/72   Pulse 60   Temp 97.7 F (36.5 C) (Oral)   Resp 18   Ht 5\' 2"  (1.575 m)   Wt 166 lb 6.4 oz (75.5 kg)   SpO2 97%   BMI 30.43 kg/m   Physical Exam  Constitutional: She is oriented to person, place, and time. She appears well-developed and well-nourished.  HENT:  Head:    Cardiovascular: Normal rate.  Pulmonary/Chest: Effort normal.  Neurological: She is alert and oriented to person, place, and time.    Assessment and Plan :   Multiple nevi - Plan: Ambulatory referral to Dermatology  Will refer to dermatology for consult given location and multitude of nevi. Patient is in agreement.  Jaynee Eagles, PA-C [facility] [phone] 02/27/2018  8:34 AM

## 2018-02-27 NOTE — Patient Instructions (Addendum)
Mole A mole is a colored (pigmented) growth on the skin. Moles are very common. They are usually harmless, but some moles can become cancerous over time. What are the causes? Moles occur when pigmented skin cells grow together in clusters instead of spreading out in the skin as they normally do. The reason why the skin cells grow together in clusters is not known. What are the signs or symptoms? A mole may be:  [place] or black.  Flat or raised.  Smooth or wrinkled.  How is this diagnosed? A mole is diagnosed with a skin exam. If your health care provider thinks a mole may be cancerous, a piece of the mole will be removed for testing. How is this treated? Treatment is not needed unless a mole is cancerous. If a mole is cancerous, it will be removed. If a mole is causing pain or you do not like the way it looks, you may choose to have it removed. Follow these instructions at home:  Every month, look for new moles and check your existing moles for changes. This is important because a change in a mole can mean that the mole has become cancerous. Look for changes in: ? Size. Look for moles that are more than  in (0.64 cm) wide (in diameter). ? Shape. Look for moles that are not round or oval. ? Borders. Look for moles that are not symmetrical. ? Color. Note that it is normal for moles to get darker during pregnancy or when you take birth control pills.  When you are outdoors, wear sunscreen with SPF 30 (sun protection factor 30) or higher. Reapply the sunscreen every 2-3 hours.  If you have a large number of moles, see a skin doctor (dermatologist) at least one time every year. Contact a health care provider if:  The size, shape, borders, or color of your mole change.  Your mole, or the skin near the mole, becomes painful, sore, red, or swollen.  Your mole: ? Develops more than one color. ? Itches or bleeds. ? Becomes scaly, sheds skin, or oozes fluid. ? Becomes flat or develops  raised areas. ? Becomes hard or soft.  You develop a new mole. This information is not intended to replace advice given to you by your health care provider. Make sure you discuss any questions you have with your health care provider. Document Released: 08/20/2001 Document Revised: 05/08/2016 Document Reviewed: 09/15/2015 Elsevier Interactive Patient Education  2018 [employer].     IF you received an x-ray today, you will receive an invoice from [facility]. Please contact [facility] at [phone] with questions or concerns regarding your invoice.   IF you received labwork today, you will receive an invoice from [facility]. Please contact [facility] at [phone] with questions or concerns regarding your invoice.   Our billing staff will not be able to assist you with questions regarding bills from these companies.  You will be contacted with the lab results as soon as they are available. The fastest way to get your results is to activate your My Chart account. Instructions are located on the last page of this paperwork. If you have not heard from [place] regarding the results in 2 weeks, please contact this office.    '

## 2018-10-20 ENCOUNTER — Encounter (HOSPITAL_COMMUNITY): Payer: Self-pay | Admitting: Emergency Medicine

## 2018-10-20 ENCOUNTER — Inpatient Hospital Stay (HOSPITAL_COMMUNITY)
Admission: EM | Admit: 2018-10-20 | Discharge: 2018-10-22 | DRG: 287 | Disposition: A | Discharge status: Home / Self Care | Payer: Medicare Other | Attending: Family Medicine | Admitting: Family Medicine

## 2018-10-20 ENCOUNTER — Other Ambulatory Visit: Payer: Self-pay

## 2018-10-20 ENCOUNTER — Emergency Department (HOSPITAL_COMMUNITY): Payer: Medicare Other

## 2018-10-20 DIAGNOSIS — K219 Gastro-esophageal reflux disease without esophagitis: Secondary | ICD-10-CM | POA: Insufficient documentation

## 2018-10-20 DIAGNOSIS — R079 Chest pain, unspecified: Secondary | ICD-10-CM | POA: Diagnosis present

## 2018-10-20 DIAGNOSIS — I493 Ventricular premature depolarization: Secondary | ICD-10-CM | POA: Diagnosis present

## 2018-10-20 DIAGNOSIS — J45909 Unspecified asthma, uncomplicated: Secondary | ICD-10-CM | POA: Diagnosis present

## 2018-10-20 DIAGNOSIS — E785 Hyperlipidemia, unspecified: Secondary | ICD-10-CM | POA: Diagnosis present

## 2018-10-20 DIAGNOSIS — H4010X0 Unspecified open-angle glaucoma, stage unspecified: Secondary | ICD-10-CM | POA: Diagnosis present

## 2018-10-20 DIAGNOSIS — I4891 Unspecified atrial fibrillation: Secondary | ICD-10-CM

## 2018-10-20 DIAGNOSIS — I959 Hypotension, unspecified: Secondary | ICD-10-CM | POA: Diagnosis present

## 2018-10-20 DIAGNOSIS — Z7982 Long term (current) use of aspirin: Secondary | ICD-10-CM

## 2018-10-20 DIAGNOSIS — I249 Acute ischemic heart disease, unspecified: Secondary | ICD-10-CM | POA: Diagnosis present

## 2018-10-20 DIAGNOSIS — Z87891 Personal history of nicotine dependence: Secondary | ICD-10-CM

## 2018-10-20 DIAGNOSIS — I9589 Other hypotension: Secondary | ICD-10-CM | POA: Diagnosis not present

## 2018-10-20 DIAGNOSIS — M1990 Unspecified osteoarthritis, unspecified site: Secondary | ICD-10-CM | POA: Diagnosis present

## 2018-10-20 DIAGNOSIS — E876 Hypokalemia: Secondary | ICD-10-CM | POA: Diagnosis present

## 2018-10-20 DIAGNOSIS — Z8249 Family history of ischemic heart disease and other diseases of the circulatory system: Secondary | ICD-10-CM

## 2018-10-20 DIAGNOSIS — I10 Essential (primary) hypertension: Secondary | ICD-10-CM | POA: Diagnosis present

## 2018-10-20 DIAGNOSIS — I480 Paroxysmal atrial fibrillation: Secondary | ICD-10-CM | POA: Diagnosis present

## 2018-10-20 DIAGNOSIS — E119 Type 2 diabetes mellitus without complications: Secondary | ICD-10-CM | POA: Diagnosis present

## 2018-10-20 DIAGNOSIS — I119 Hypertensive heart disease without heart failure: Secondary | ICD-10-CM | POA: Diagnosis present

## 2018-10-20 DIAGNOSIS — R000 Tachycardia, unspecified: Secondary | ICD-10-CM | POA: Diagnosis present

## 2018-10-20 DIAGNOSIS — Z79899 Other long term (current) drug therapy: Secondary | ICD-10-CM

## 2018-10-20 DIAGNOSIS — I2510 Atherosclerotic heart disease of native coronary artery without angina pectoris: Secondary | ICD-10-CM | POA: Diagnosis present

## 2018-10-20 HISTORY — DX: Cerebral infarction, unspecified: I63.9

## 2018-10-20 HISTORY — DX: Personal history of urinary calculi: Z87.442

## 2018-10-20 HISTORY — DX: Personal history of other diseases of the digestive system: Z87.19

## 2018-10-20 HISTORY — DX: Type 2 diabetes mellitus without complications: E11.9

## 2018-10-20 HISTORY — DX: Procedure and treatment not carried out because of patient's decision for reasons of belief and group pressure: Z53.1

## 2018-10-20 HISTORY — DX: Personal history of other diseases of the musculoskeletal system and connective tissue: Z87.39

## 2018-10-20 HISTORY — DX: Reserved for inherently not codable concepts without codable children: IMO0001

## 2018-10-20 LAB — CBC WITH DIFFERENTIAL/PLATELET
Abs Immature Granulocytes: 0.02 10*3/uL (ref 0.00–0.07)
BASOS ABS: 0 10*3/uL (ref 0.0–0.1)
BASOS PCT: 0 %
EOS ABS: 0.2 10*3/uL (ref 0.0–0.5)
EOS PCT: 3 %
HEMATOCRIT: 45.1 % (ref 36.0–46.0)
Hemoglobin: 14.4 g/dL (ref 12.0–15.0)
IMMATURE GRANULOCYTES: 0 %
LYMPHS ABS: 2.1 10*3/uL (ref 0.7–4.0)
Lymphocytes Relative: 36 %
MCH: 29 pg (ref 26.0–34.0)
MCHC: 31.9 g/dL (ref 30.0–36.0)
MCV: 90.7 fL (ref 80.0–100.0)
Monocytes Absolute: 0.5 10*3/uL (ref 0.1–1.0)
Monocytes Relative: 9 %
NEUTROS PCT: 52 %
NRBC: 0 % (ref 0.0–0.2)
Neutro Abs: 3 10*3/uL (ref 1.7–7.7)
PLATELETS: 212 10*3/uL (ref 150–400)
RBC: 4.97 MIL/uL (ref 3.87–5.11)
RDW: 13.5 % (ref 11.5–15.5)
WBC: 5.8 10*3/uL (ref 4.0–10.5)

## 2018-10-20 LAB — T4, FREE: FREE T4: 0.89 ng/dL (ref 0.82–1.77)

## 2018-10-20 LAB — COMPREHENSIVE METABOLIC PANEL
ALT: 18 U/L (ref 0–44)
ANION GAP: 8 (ref 5–15)
AST: 16 U/L (ref 15–41)
Albumin: 3.2 g/dL — ABNORMAL LOW (ref 3.5–5.0)
Alkaline Phosphatase: 83 U/L (ref 38–126)
BUN: 12 mg/dL (ref 8–23)
CALCIUM: 8.9 mg/dL (ref 8.9–10.3)
CO2: 27 mmol/L (ref 22–32)
CREATININE: 0.89 mg/dL (ref 0.44–1.00)
Chloride: 102 mmol/L (ref 98–111)
GFR, EST NON AFRICAN AMERICAN: 59 mL/min — AB (ref >60)
Glucose, Bld: 166 mg/dL — ABNORMAL HIGH (ref 70–99)
Potassium: 3 mmol/L — ABNORMAL LOW (ref 3.5–5.1)
SODIUM: 137 mmol/L (ref 135–145)
Total Bilirubin: 0.7 mg/dL (ref 0.3–1.2)
Total Protein: 7.1 g/dL (ref 6.5–8.1)

## 2018-10-20 LAB — TROPONIN I: Troponin I: <0.03 ng/mL (ref <0.03)

## 2018-10-20 LAB — TSH: TSH: 1.442 u[IU]/mL (ref 0.350–4.500)

## 2018-10-20 LAB — MRSA PCR SCREENING: MRSA BY PCR: NEGATIVE

## 2018-10-20 LAB — I-STAT TROPONIN, ED: TROPONIN I, POC: 0 ng/mL (ref 0.00–0.08)

## 2018-10-20 LAB — GLUCOSE, CAPILLARY
GLUCOSE-CAPILLARY: 116 mg/dL — AB (ref 70–99)
GLUCOSE-CAPILLARY: 158 mg/dL — AB (ref 70–99)

## 2018-10-20 LAB — MAGNESIUM: Magnesium: 2.2 mg/dL (ref 1.7–2.4)

## 2018-10-20 IMAGING — DX DG CHEST 1V PORT
1 series · 1 of 1 positions shown · non-contrast
Comparison: Chest x-ray [DATE]

CLINICAL DATA: Intermittent chest pain for a week, dizziness

EXAM:
PORTABLE CHEST 1 VIEW

[chest ap]
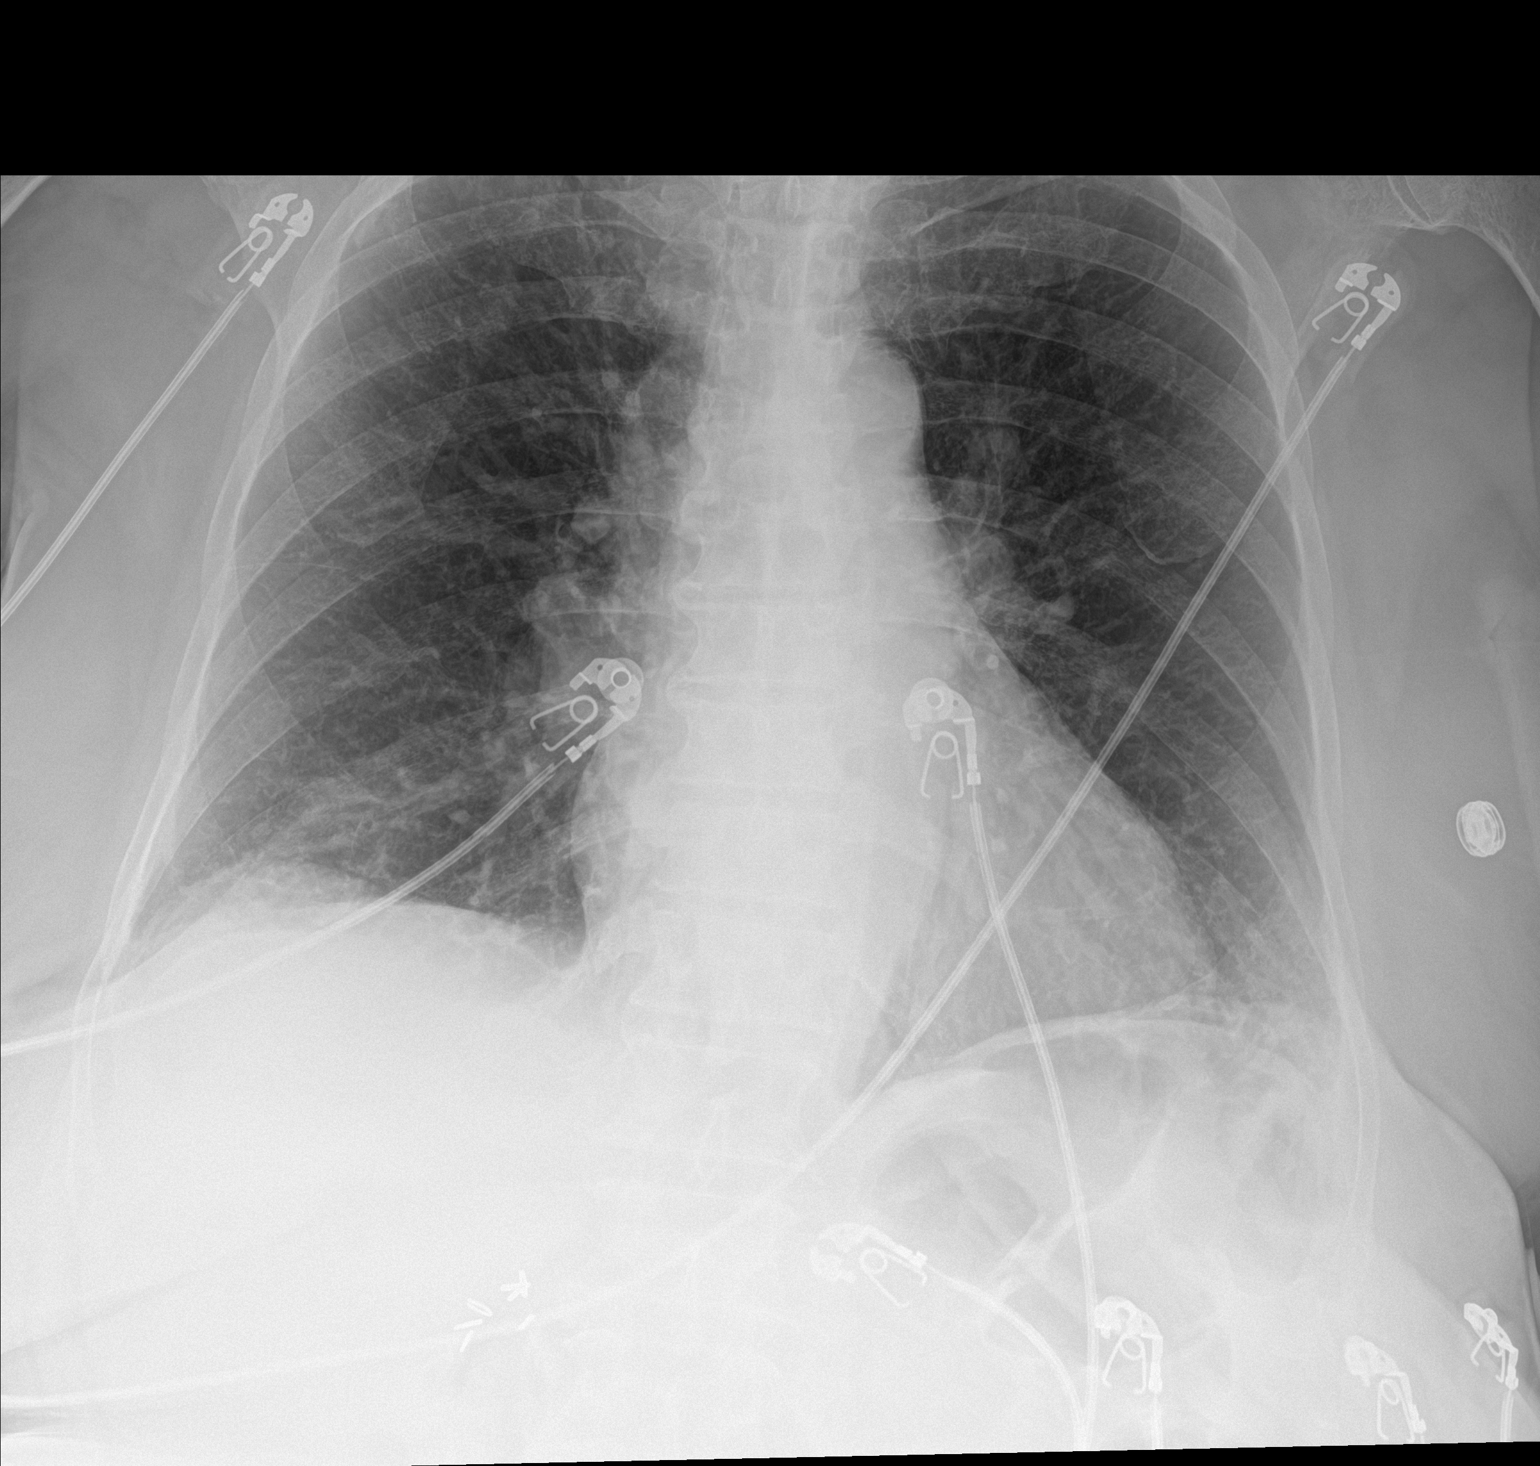

[1 of 1 positions shown; findings below may reference images not displayed]

FINDINGS: No pneumonia or effusion is seen. Probable mild chronic fibrotic
change is present at the bases. Mediastinal and hilar contours are
unremarkable and mild cardiomegaly is stable. No bony abnormality is
seen.
IMPRESSION: No active disease.

## 2018-10-20 MED ORDER — DORZOLAMIDE HCL 2 % OP SOLN
1.0000 [drp] | Freq: Two times a day (BID) | OPHTHALMIC | Status: DC
  Administered 2018-10-20 – 2018-10-22 (×4): 1 [drp] via OPHTHALMIC
  Filled 2018-10-20: qty 10

## 2018-10-20 MED ORDER — POTASSIUM CHLORIDE 10 MEQ/100ML IV SOLN
10.0000 meq | INTRAVENOUS | Status: DC
  Administered 2018-10-20: 10 meq via INTRAVENOUS
  Filled 2018-10-20 (×3): qty 100

## 2018-10-20 MED ORDER — ONDANSETRON HCL 4 MG/2ML IJ SOLN: 4.0000 mg | Freq: Four times a day (QID) | INTRAMUSCULAR | Status: DC | PRN

## 2018-10-20 MED ORDER — HEPARIN SODIUM (PORCINE) 5000 UNIT/ML IJ SOLN: 5000.0000 [IU] | Freq: Three times a day (TID) | INTRAMUSCULAR | Status: DC

## 2018-10-20 MED ORDER — DORZOLAMIDE HCL-TIMOLOL MAL 2-0.5 % OP SOLN
1.0000 [drp] | Freq: Two times a day (BID) | OPHTHALMIC | Status: DC
  Administered 2018-10-20 – 2018-10-22 (×4): 1 [drp] via OPHTHALMIC
  Filled 2018-10-20: qty 10

## 2018-10-20 MED ORDER — INSULIN ASPART 100 UNIT/ML ~~LOC~~ SOLN
0.0000 [IU] | Freq: Three times a day (TID) | SUBCUTANEOUS | Status: DC
  Administered 2018-10-21: 2 [IU] via SUBCUTANEOUS
  Administered 2018-10-21: 1 [IU] via SUBCUTANEOUS

## 2018-10-20 MED ORDER — DILTIAZEM LOAD VIA INFUSION
10.0000 mg | Freq: Once | INTRAVENOUS | Status: DC
  Filled 2018-10-20: qty 10

## 2018-10-20 MED ORDER — SODIUM CHLORIDE 0.9% FLUSH: 3.0000 mL | INTRAVENOUS | Status: DC | PRN

## 2018-10-20 MED ORDER — HEPARIN BOLUS VIA INFUSION
4000.0000 [IU] | Freq: Once | INTRAVENOUS | Status: AC
  Administered 2018-10-20: 4000 [IU] via INTRAVENOUS
  Filled 2018-10-20: qty 4000

## 2018-10-20 MED ORDER — INSULIN ASPART 100 UNIT/ML ~~LOC~~ SOLN: 0.0000 [IU] | Freq: Every day | SUBCUTANEOUS | Status: DC

## 2018-10-20 MED ORDER — SODIUM CHLORIDE 0.9% FLUSH: 3.0000 mL | Freq: Two times a day (BID) | INTRAVENOUS | Status: DC

## 2018-10-20 MED ORDER — SODIUM CHLORIDE 0.9 % IV SOLN
INTRAVENOUS | Status: DC
  Administered 2018-10-20: 19:00:00 via INTRAVENOUS

## 2018-10-20 MED ORDER — MAGNESIUM SULFATE 2 GM/50ML IV SOLN
2.0000 g | Freq: Once | INTRAVENOUS | Status: AC
  Administered 2018-10-20: 2 g via INTRAVENOUS
  Filled 2018-10-20: qty 50

## 2018-10-20 MED ORDER — ASPIRIN 81 MG PO CHEW
81.0000 mg | CHEWABLE_TABLET | ORAL | Status: AC
  Administered 2018-10-21: 81 mg via ORAL

## 2018-10-20 MED ORDER — SODIUM CHLORIDE 0.9 % IV SOLN: 250.0000 mL | INTRAVENOUS | Status: DC | PRN

## 2018-10-20 MED ORDER — ACETAMINOPHEN 325 MG PO TABS: 650.0000 mg | ORAL_TABLET | ORAL | Status: DC | PRN

## 2018-10-20 MED ORDER — DIGOXIN 0.25 MG/ML IJ SOLN
0.2500 mg | Freq: Once | INTRAMUSCULAR | Status: AC
  Administered 2018-10-20: 0.25 mg via INTRAVENOUS
  Filled 2018-10-20: qty 2

## 2018-10-20 MED ORDER — ASPIRIN 81 MG PO CHEW: 162.0000 mg | CHEWABLE_TABLET | Freq: Once | ORAL | Status: DC

## 2018-10-20 MED ORDER — METOPROLOL TARTRATE 25 MG PO TABS
25.0000 mg | ORAL_TABLET | Freq: Two times a day (BID) | ORAL | Status: DC
  Filled 2018-10-20: qty 1

## 2018-10-20 MED ORDER — ASPIRIN 81 MG PO CHEW
81.0000 mg | CHEWABLE_TABLET | Freq: Once | ORAL | Status: DC
  Filled 2018-10-20 (×2): qty 1

## 2018-10-20 MED ORDER — SODIUM CHLORIDE 0.9 % IV BOLUS
1000.0000 mL | Freq: Once | INTRAVENOUS | Status: AC
  Administered 2018-10-20: 1000 mL via INTRAVENOUS

## 2018-10-20 MED ORDER — METOPROLOL TARTRATE 5 MG/5ML IV SOLN
5.0000 mg | Freq: Once | INTRAVENOUS | Status: AC
  Administered 2018-10-20: 5 mg via INTRAVENOUS
  Filled 2018-10-20: qty 5

## 2018-10-20 MED ORDER — POTASSIUM CHLORIDE CRYS ER 10 MEQ PO TBCR
20.0000 meq | EXTENDED_RELEASE_TABLET | Freq: Two times a day (BID) | ORAL | Status: DC
  Administered 2018-10-20 – 2018-10-22 (×4): 20 meq via ORAL
  Filled 2018-10-20 (×8): qty 2

## 2018-10-20 MED ORDER — HEPARIN (PORCINE) 25000 UT/250ML-% IV SOLN
850.0000 [IU]/h | INTRAVENOUS | Status: AC
  Administered 2018-10-20: 800 [IU]/h via INTRAVENOUS
  Administered 2018-10-21: 850 [IU]/h via INTRAVENOUS
  Filled 2018-10-20 (×2): qty 250

## 2018-10-20 MED ORDER — ASPIRIN EC 81 MG PO TBEC: 81.0000 mg | DELAYED_RELEASE_TABLET | Freq: Every day | ORAL | Status: DC

## 2018-10-20 MED ORDER — DILTIAZEM HCL-DEXTROSE 100-5 MG/100ML-% IV SOLN (PREMIX)
5.0000 mg/h | INTRAVENOUS | Status: DC
  Filled 2018-10-20: qty 100

## 2018-10-20 MED ORDER — DILTIAZEM HCL 60 MG PO TABS
30.0000 mg | ORAL_TABLET | Freq: Four times a day (QID) | ORAL | Status: DC
  Administered 2018-10-20 – 2018-10-21 (×2): 30 mg via ORAL
  Filled 2018-10-20 (×3): qty 1

## 2018-10-20 MED ORDER — POTASSIUM CHLORIDE CRYS ER 20 MEQ PO TBCR
40.0000 meq | EXTENDED_RELEASE_TABLET | Freq: Once | ORAL | Status: AC
  Administered 2018-10-20: 40 meq via ORAL
  Filled 2018-10-20: qty 2

## 2018-10-20 NOTE — H&P (View-Only) (Signed)
Referring Physician: [name] is an 84 y.o. female.                       Chief Complaint: Chest pain and atrial fibrillation  HPI: 84 year old female has exertional recurrent chest pain with risk factor of age, Type 2 DM, Hypertension and hyperlipidemia. She also has atrial fibrillation with rapid ventricular response. Chest x-ray was unremarkable. With low blood pressure IV diltiazem was not given and IV lanoxin and IV metoprolol 5 mg. Slow injection was given. IV heparin will be started for ACS. She is active and ambulates well otherwise.  Past Medical History:  Diagnosis Date  . Arthritis   . Asthma   . Diabetes mellitus   . GERD (gastroesophageal reflux disease)   . Hyperlipidemia ~2005  . Hypertension       Past Surgical History:  Procedure Laterality Date  . CHOLECYSTECTOMY  30 yrs ago  . EYE SURGERY    . FRACTURE SURGERY  ~2006   Rt knee  . HERNIA REPAIR  ~5 yrs ago   umbilical    Family History  Problem Relation Age of Onset  . Hypertension Mother   . Hypertension Father   . Breast cancer Daughter    Social History:  reports that she quit smoking about 45 years ago. She has never used smokeless tobacco. She reports that she drinks alcohol. She reports that she does not use drugs.  Allergies: No Known Allergies  Medications Prior to Admission  Medication Sig Dispense Refill  . aspirin 81 MG chewable tablet Chew 162 mg by mouth once.    . dorzolamide (TRUSOPT) 2 % ophthalmic solution Place 1 drop into both eyes 2 (two) times daily.  12  . dorzolamide-timolol (COSOPT) 22.3-6.8 MG/ML ophthalmic solution Place 1 drop into both eyes 2 (two) times daily.  4    Results for orders placed or performed during the hospital encounter of 10/20/18 (from the past 48 hour(s))  I-stat troponin, ED     Status: None   Collection Time: 10/20/18  2:04 PM  Result Value Ref Range   Troponin i, poc 0.00 0.00 - 0.08 ng/mL   Comment 3           Comment: Due to the release kinetics of cTnI, a negative result within the first hours of the onset of symptoms does not rule out myocardial infarction with certainty. If myocardial infarction is still suspected, repeat the test at appropriate intervals.   Comprehensive metabolic panel     Status: Abnormal   Collection Time: 10/20/18  2:19 PM  Result Value Ref Range   Sodium 137 135 - 145 mmol/L   Potassium 3.0 (L) 3.5 - 5.1 mmol/L   Chloride 102 98 - 111 mmol/L   CO2 27 22 - 32 mmol/L   Glucose, Bld 166 (H) 70 - 99 mg/dL   BUN 12 8 - 23 mg/dL   Creatinine, Ser 0.89 0.44 - 1.00 mg/dL   Calcium 8.9 8.9 - 10.3 mg/dL   Total Protein 7.1 6.5 - 8.1 g/dL   Albumin 3.2 (L) 3.5 - 5.0 g/dL   AST 16 15 - 41 U/L   ALT 18 0 - 44 U/L   Alkaline Phosphatase 83 38 - 126 U/L   Total Bilirubin 0.7 0.3 - 1.2 mg/dL   GFR calc non Af Amer 59 (L) >60 mL/min   GFR calc Af Amer >60 >60 mL/min    Comment: (  NOTE) The eGFR has been calculated using the CKD EPI equation. This calculation has not been validated in all clinical situations. eGFR's persistently <60 mL/min signify possible Chronic Kidney Disease.    Anion gap 8 5 - 15    Comment: Performed at [facility], [address] [place], [place] [address]  CBC with Differential     Status: None   Collection Time: 10/20/18  2:19 PM  Result Value Ref Range   WBC 5.8 4.0 - 10.5 K/uL   RBC 4.97 3.87 - 5.11 MIL/uL   Hemoglobin 14.4 12.0 - 15.0 g/dL   HCT 45.1 36.0 - 46.0 %   MCV 90.7 80.0 - 100.0 fL   MCH 29.0 26.0 - 34.0 pg   MCHC 31.9 30.0 - 36.0 g/dL   RDW 13.5 11.5 - 15.5 %   Platelets 212 150 - 400 K/uL   nRBC 0.0 0.0 - 0.2 %   Neutrophils Relative % 52 %   Neutro Abs 3.0 1.7 - 7.7 K/uL   Lymphocytes Relative 36 %   Lymphs Abs 2.1 0.7 - 4.0 K/uL   Monocytes Relative 9 %   Monocytes Absolute 0.5 0.1 - 1.0 K/uL   Eosinophils Relative 3 %   Eosinophils Absolute 0.2 0.0 - 0.5 K/uL   Basophils Relative 0 %   Basophils Absolute 0.0  0.0 - 0.1 K/uL   Immature Granulocytes 0 %   Abs Immature Granulocytes 0.02 0.00 - 0.07 K/uL    Comment: Performed at [facility], [address] [place], [place] [address]  Glucose, capillary     Status: Abnormal   Collection Time: 10/20/18  5:34 PM  Result Value Ref Range   Glucose-Capillary 116 (H) 70 - 99 mg/dL   Dg Chest Portable 1 View  Result Date: 10/20/2018 CLINICAL DATA:  Intermittent chest pain for a week, dizziness EXAM: PORTABLE CHEST 1 VIEW COMPARISON:  Chest x-ray 05/20/2015 FINDINGS: No pneumonia or effusion is seen. Probable mild chronic fibrotic change is present at the bases. Mediastinal and hilar contours are unremarkable and mild cardiomegaly is stable. No bony abnormality is seen. IMPRESSION: No active disease. Electronically Signed   By: Paul  Barry M.D.   On: 10/20/2018 15:04    Review Of Systems Constitutional: No fever, chills, weight loss or gain. Eyes: No vision change, wears glasses. No discharge or pain. Ears: No hearing loss, No tinnitus. Respiratory: No asthma, COPD, pneumonias. Positive shortness of breath. No hemoptysis. Cardiovascular: Positive chest pain, palpitation, no leg edema. Gastrointestinal: No nausea, vomiting, diarrhea, constipation. No GI bleed. No hepatitis. Genitourinary: No dysuria, hematuria, kidney stone. No incontinance. Neurological: No headache, stroke, seizures.  Psychiatry: No psych facility admission for anxiety, depression, suicide. No detox. Skin: No rash. Musculoskeletal: Positive joint pain, no fibromyalgia. No neck pain, back pain. Lymphadenopathy: No lymphadenopathy. Hematology: No anemia or easy bruising.   Blood pressure (!) 87/73, pulse 71, temperature 97.7 F (36.5 C), temperature source Oral, resp. rate 17, height 5' 2" (1.575 m), weight 77.1 kg, SpO2 96 %. Body mass index is 31.09 kg/m. General appearance: alert, cooperative, appears stated age and no distress Head: Normocephalic, atraumatic. Eyes: Brown  eyes, pink conjunctiva, corneas clear. PERRL, EOM's intact. Neck: No adenopathy, no carotid bruit, no JVD, supple, symmetrical, trachea midline and thyroid not enlarged. Resp: Clear to auscultation bilaterally. Cardio: Regular rate and rhythm, S1, S2 normal, II/VI systolic murmur, no click, rub or gallop GI: Soft, non-tender; bowel sounds normal; no organomegaly. Extremities: No edema, cyanosis or   clubbing. Skin: Warm and dry.  Neurologic: Alert and oriented X 3, normal strength. Normal coordination..  Assessment/Plan Acute coronary syndrome Atrial fibrillation with RVR, CHA2DS2VASc score 4 Type 2 DM H/O hypertension Hyperlipidemia R/O hyperthyroidism  Agree with echocardiogram and Troponin I level checks. IV metoprolol small dose for rate control If needed use amiodarone. IV heparin for ACS and atrial fibrillation of unknown duration. Patient agreeable to cardiac cath in AM and understood procedure and risks. Change IV potassium to PO for IV line and vein burning pain.  [name], MD  10/20/2018, 5:50 PM

## 2018-10-20 NOTE — Consult Note (Signed)
Referring Physician: [name] is an 84 y.o. female.                       Chief Complaint: Chest pain and atrial fibrillation  HPI: 84 year old female has exertional recurrent chest pain with risk factor of age, Type 2 DM, Hypertension and hyperlipidemia. She also has atrial fibrillation with rapid ventricular response. Chest x-ray was unremarkable. With low blood pressure IV diltiazem was not given and IV lanoxin and IV metoprolol 5 mg. Slow injection was given. IV heparin will be started for ACS. She is active and ambulates well otherwise.  Past Medical History:  Diagnosis Date  . Arthritis   . Asthma   . Diabetes mellitus   . GERD (gastroesophageal reflux disease)   . Hyperlipidemia ~2005  . Hypertension       Past Surgical History:  Procedure Laterality Date  . CHOLECYSTECTOMY  30 yrs ago  . EYE SURGERY    . FRACTURE SURGERY  ~2006   Rt knee  . HERNIA REPAIR  ~5 yrs ago   umbilical    Family History  Problem Relation Age of Onset  . Hypertension Mother   . Hypertension Father   . Breast cancer Daughter    Social History:  reports that she quit smoking about 45 years ago. She has never used smokeless tobacco. She reports that she drinks alcohol. She reports that she does not use drugs.  Allergies: No Known Allergies  Medications Prior to Admission  Medication Sig Dispense Refill  . aspirin 81 MG chewable tablet Chew 162 mg by mouth once.    . dorzolamide (TRUSOPT) 2 % ophthalmic solution Place 1 drop into both eyes 2 (two) times daily.  12  . dorzolamide-timolol (COSOPT) 22.3-6.8 MG/ML ophthalmic solution Place 1 drop into both eyes 2 (two) times daily.  4    Results for orders placed or performed during the hospital encounter of 10/20/18 (from the past 48 hour(s))  I-stat troponin, ED     Status: None   Collection Time: 10/20/18  2:04 PM  Result Value Ref Range   Troponin i, poc 0.00 0.00 - 0.08 ng/mL   Comment 3           Comment: Due to the release kinetics of cTnI, a negative result within the first hours of the onset of symptoms does not rule out myocardial infarction with certainty. If myocardial infarction is still suspected, repeat the test at appropriate intervals.   Comprehensive metabolic panel     Status: Abnormal   Collection Time: 10/20/18  2:19 PM  Result Value Ref Range   Sodium 137 135 - 145 mmol/L   Potassium 3.0 (L) 3.5 - 5.1 mmol/L   Chloride 102 98 - 111 mmol/L   CO2 27 22 - 32 mmol/L   Glucose, Bld 166 (H) 70 - 99 mg/dL   BUN 12 8 - 23 mg/dL   Creatinine, Ser 0.89 0.44 - 1.00 mg/dL   Calcium 8.9 8.9 - 10.3 mg/dL   Total Protein 7.1 6.5 - 8.1 g/dL   Albumin 3.2 (L) 3.5 - 5.0 g/dL   AST 16 15 - 41 U/L   ALT 18 0 - 44 U/L   Alkaline Phosphatase 83 38 - 126 U/L   Total Bilirubin 0.7 0.3 - 1.2 mg/dL   GFR calc non Af Amer 59 (L) >60 mL/min   GFR calc Af Amer >60 >60 mL/min    Comment: (  NOTE) The eGFR has been calculated using the CKD EPI equation. This calculation has not been validated in all clinical situations. eGFR's persistently <60 mL/min signify possible Chronic Kidney Disease.    Anion gap 8 5 - 15    Comment: Performed at [facility] [address] [place], [place] [address]  CBC with Differential     Status: None   Collection Time: 10/20/18  2:19 PM  Result Value Ref Range   WBC 5.8 4.0 - 10.5 K/uL   RBC 4.97 3.87 - 5.11 MIL/uL   Hemoglobin 14.4 12.0 - 15.0 g/dL   HCT 45.1 36.0 - 46.0 %   MCV 90.7 80.0 - 100.0 fL   MCH 29.0 26.0 - 34.0 pg   MCHC 31.9 30.0 - 36.0 g/dL   RDW 13.5 11.5 - 15.5 %   Platelets 212 150 - 400 K/uL   nRBC 0.0 0.0 - 0.2 %   Neutrophils Relative % 52 %   Neutro Abs 3.0 1.7 - 7.7 K/uL   Lymphocytes Relative 36 %   Lymphs Abs 2.1 0.7 - 4.0 K/uL   Monocytes Relative 9 %   Monocytes Absolute 0.5 0.1 - 1.0 K/uL   Eosinophils Relative 3 %   Eosinophils Absolute 0.2 0.0 - 0.5 K/uL   Basophils Relative 0 %   Basophils Absolute 0.0  0.0 - 0.1 K/uL   Immature Granulocytes 0 %   Abs Immature Granulocytes 0.02 0.00 - 0.07 K/uL    Comment: Performed at [facility], [address], [place], [place] [address]  Glucose, capillary     Status: Abnormal   Collection Time: 10/20/18  5:34 PM  Result Value Ref Range   Glucose-Capillary 116 (H) 70 - 99 mg/dL   Dg Chest Portable 1 View  Result Date: 10/20/2018 CLINICAL DATA:  Intermittent chest pain for a week, dizziness EXAM: PORTABLE CHEST 1 VIEW COMPARISON:  Chest x-ray 05/20/2015 FINDINGS: No pneumonia or effusion is seen. Probable mild chronic fibrotic change is present at the bases. Mediastinal and hilar contours are unremarkable and mild cardiomegaly is stable. No bony abnormality is seen. IMPRESSION: No active disease. Electronically Signed   By: Ivar Drape M.D.   On: 10/20/2018 15:04    Review Of Systems Constitutional: No fever, chills, weight loss or gain. Eyes: No vision change, wears glasses. No discharge or pain. Ears: No hearing loss, No tinnitus. Respiratory: No asthma, COPD, pneumonias. Positive shortness of breath. No hemoptysis. Cardiovascular: Positive chest pain, palpitation, no leg edema. Gastrointestinal: No nausea, vomiting, diarrhea, constipation. No GI bleed. No hepatitis. Genitourinary: No dysuria, hematuria, kidney stone. No incontinance. Neurological: No headache, stroke, seizures.  Psychiatry: No psych facility admission for anxiety, depression, suicide. No detox. Skin: No rash. Musculoskeletal: Positive joint pain, no fibromyalgia. No neck pain, back pain. Lymphadenopathy: No lymphadenopathy. Hematology: No anemia or easy bruising.   Blood pressure (!) 87/73, pulse 71, temperature 97.7 F (36.5 C), temperature source Oral, resp. rate 17, height _0  (1.575 m), weight 77.1 kg, SpO2 96 %. Body mass index is 31.09 kg/m. General appearance: alert, cooperative, appears stated age and no distress Head: Normocephalic, atraumatic. Eyes: Brown  eyes, pink conjunctiva, corneas clear. PERRL, EOM's intact. Neck: No adenopathy, no carotid bruit, no JVD, supple, symmetrical, trachea midline and thyroid not enlarged. Resp: Clear to auscultation bilaterally. Cardio: Regular rate and rhythm, S1, S2 normal, II/VI systolic murmur, no click, rub or gallop GI: Soft, non-tender; bowel sounds normal; no organomegaly. Extremities: No edema, cyanosis or  clubbing. Skin: Warm and dry.  Neurologic: Alert and oriented X 3, normal strength. Normal coordination..  Assessment/Plan Acute coronary syndrome Atrial fibrillation with RVR, CHA2DS2VASc score 4 Type 2 DM H/O hypertension Hyperlipidemia R/O hyperthyroidism  Agree with echocardiogram and Troponin I level checks. IV metoprolol small dose for rate control If needed use amiodarone. IV heparin for ACS and atrial fibrillation of unknown duration. Patient agreeable to cardiac cath in AM and understood procedure and risks. Change IV potassium to PO for IV line and vein burning pain.  [name], MD  10/20/2018, 5:50 PM

## 2018-10-20 NOTE — Plan of Care (Signed)
  Problem: Education: Goal: Knowledge of General Education information will improve Description: Including pain rating scale, medication(s)/side effects and non-pharmacologic comfort measures Outcome: Progressing   Problem: Clinical Measurements: Goal: Respiratory complications will improve Outcome: Completed/Met   

## 2018-10-20 NOTE — ED Notes (Signed)
MD made aware of pts BP 90/69. Orders to give bolus before starting cardizem gtt

## 2018-10-20 NOTE — Progress Notes (Signed)
Pt's SBP in the 80's-90's. HR still in the 100's. IV lopressor, Po Cardizem and Po lopressor ordered to help control HR per Dr. Doylene Canard. Will cont to monitor pt.

## 2018-10-20 NOTE — Progress Notes (Addendum)
ANTICOAGULATION CONSULT NOTE - Initial Consult  Pharmacy Consult for heparin Indication: chest pain/ACS  No Known Allergies  Patient Measurements: Height: 5\' 2"  (157.5 cm) Weight: 170 lb (77.1 kg) IBW/kg (Calculated) : 50.1 Heparin Dosing Weight: 67 kg  Vital Signs: Temp: 97.7 F (36.5 C) (11/12 1721) Temp Source: Oral (11/12 1721) BP: 87/73 (11/12 1721) Pulse Rate: 71 (11/12 1721)  Labs: Recent Labs    10/20/18 1419  HGB 14.4  HCT 45.1  PLT 212  CREATININE 0.89    Estimated Creatinine Clearance: 47.7 mL/min (by C-G formula based on SCr of 0.89 mg/dL).   Medical History: Past Medical History:  Diagnosis Date  . Arthritis   . Asthma   . Diabetes mellitus   . GERD (gastroesophageal reflux disease)   . Hyperlipidemia ~2005  . Hypertension     Medications:  Scheduled:  . diltiazem  30 mg Oral QID  . dorzolamide  1 drop Both Eyes BID  . dorzolamide-timolol  1 drop Both Eyes BID  . heparin  5,000 Units Subcutaneous Q8H  . insulin aspart  0-5 Units Subcutaneous QHS  . insulin aspart  0-9 Units Subcutaneous TID WC  . metoprolol tartrate  25 mg Oral BID  . potassium chloride  20 mEq Oral BID    Assessment: 59 yof presenting with atrial fibrillation and exertional recurrent chest pain. CHADS-VASc 4. Not on anticoagulation PTA.   Hgb 14.4, plt 212. No s/sx of bleeding.   Goal of Therapy:  Heparin level 0.3-0.7 units/ml Monitor platelets by anticoagulation protocol: Yes   Plan:  Give 4000 units bolus x 1 Start heparin infusion at 800 units/hr Check anti-Xa level in 8 hours and daily while on heparin Continue to monitor H&H and platelets  Doylene Canard, PharmD Clinical Pharmacist  Pager: [phone] Phone: [phone] 10/20/2018,6:45 PM

## 2018-10-20 NOTE — ED Triage Notes (Signed)
Pt arrives via EMS from home with intermittent chest pain since last Tuesday, worsened with movement. Endorses N/V and dizziness. Pt took 2 81 mg ASA. Pt has no hx of A-fib.

## 2018-10-20 NOTE — H&P (Addendum)
[facility] Admission History and Physical Service Pager: [phone]  Patient name: Chelsea Gallegos Medical record number: [id number] Date of birth: 08-May-1934 Age: 84 y.o. Gender: female  Primary Care Provider: Rutherford Guys, MD Consultants: Cardiology  Code Status: Limited code (DNI)  Chief Complaint: Chest pain  Assessment and Plan: Chelsea Gallegos is a 84 y.o. female presenting with intermittent chest pain worse with activity x 1 week . PMH is significant for Afib, HTN, DMII, HLD GERD which are not treated with any medications as she has not seen a doctor in three years.   #Chest pain concerning for ACS Chest pain is consistent with acute coronary syndrome/stable angina as she characterizes her chest pain worsening with exertion and improving with lying down.  Patient had also has several risk factors associated with CAD, especially as many of her comorbidities have remained untreated.  Can also consider pericardial tamponade in setting of hypotension, tachycardia, shortness of breath however this is less likely as patient is generally stable, does not have elevated JVD and no recent history of trauma or inciting event. Patient also has history of GERD/esophageal dysmotility, and reports an episode of vomiting today that helped relieve some chest pain, but is less likely the cause of chest pain. Pt's hemoglobin normal, ruling out anemia. Patient denies recent trauma, rigorous activity involving her chest.  Can also consider acute pancreatitis as patient's pain is epigastric and radiates to her back; however, CMP is within normal limits.  Patient's i-STAT troponin negative, however, EKG significant for minor ST elevations in inferior leads.  Chest x-ray shows stable cardiomegaly.  Her labs are otherwise unremarkable.   Admit to FMTS - Unit: Tele - Attending: Dr. McDiarmid  Continuous Cardiac monitoring & pulse ox Vitals per unit protocol Diet regular. NPO  at midnight. Out of bed with assistance, fall risk Initial Consults   Cardiology  Plans to consult PT/OT after initial cardiac work up.   Studies:   Repeat AM EKG, BMP, magnesium  Serial Troponins q6hours   Echocardiogram   Meds  Start ASA 81  #Atrial fibrillation w/ RVR Patient noted to be in A. fib with RVR.  She denies dizziness, palpitations.  Currently, beta-blockers and calcium channel blockers are contraindicated at this time.  Patient will likely need to be started on long-term anticoagulation for primary stroke prevention but will have to discuss risk and benefit as patient is fall risk with poor eyesight and hypotensive.  Patient denies any history of strokes in the past.  Her neurologic exam is within normal limits today.  Per chart review, patient was recently seen at [facility] and [place].  There is record of paroxysmal A. fib noted on January 08, 2017.  Start IV digoxin for rate control   ASA 81  Echo tomorrow  Heparin 5000 units subcu daily (DVT Ppx)  hold other anticoags for possible procedure/intervention  Consult cardiology  #Hypotension S/p 1L NS in ED. Patient's pressures continue to be in high 68/34'H systolic.  Most likely due to a fib with poor cardiac output; however, should rule out hypothyroidism, heart failure. Can also consider dehydration/low volume status, however creatinine is normal with no other major metabolic lab abnormalities.  Patient denies being on any recent medications.  BNP and TSH  Hold all antihypertensives, especially beta-blockers and calcium channel blockers.  #DMII Patient does not currently take medications for diabetes.  Patient's glucose on arrival is 166.  Her creatinine is 0.89, and GFR is just > 60.  AM A1c  CBGs q12hours  Consider insulin therapy pending CBGs x 24 hours  #HTN Patient does not currently take any medications per high blood pressure.  There is no recent history of vitals of which to compare.   Patient is currently hypotensive.  Continue to monitor blood pressures  No antihypertensives at this time  #HLD Patient has history of HLD and is currently not on a statin.  Increasing the risk of CAD greatly.  Lipid panel  Will likely start statin during this admission, especially with history of diabetes  #Hypokalemia Potassium on admission is 3.0.  Status post below 40 mEq x 1.  Repeat a.m. 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 She does report some worsening of vision chronically in her left eye.  Patient reports being started on 2 new medications with the ophthalmologist today, though she cannot remember the medication names.  Per chart review, it appears patient has been through several different medications for various ophthalmologic issues.  Continue home dorzolamide and Cosopt for glaucoma.   Contact ophthalmologist for recent medication changes.   F: s/p 1L for soft BP E: Replete PRN N: NPO at midnight  GI ppx: none DVT ppx: Heparin q8   Future Labs: AM EKG, BMP, CBC, TROPS, A1C, LIPID PANEL, BNP Disposition: Telemetry  History of the Present Illness  Chelsea Gallegos is a 84 y.o. female with past medical history significant for untreated diabetes, hypertension, who presents with intermittent chest pain x7 days that worsens with activity.  The chest pain is located near her epigastrium/subxiphoid and radiates to her back.  She reports that she feels shortness of breath and the quality of the pain is squeezing.  She tried to take 2 baby aspirin today which did not help.  She noticed that her chest pain was acutely worse today after eating.  She reports that after lunch, she had an episode of emesis which helped to relieve some of the pressure in her chest.  She denies being nauseous at that time and felt as if the food was stuck in her throat. She denies diaphoresis or  radiation to the arm or jaw patient's  Patient reports a similar episode to this about 3 years ago in this ED.  She reports that she did not take any of the medication that she was prescribed after.  She also reports seeing cardiology around that time with Dr. Terrence Dupont. She was placed on a medication, which she read but because the symptoms they were trying to treat, so she did not take the medication.  She denies ever being on blood thinners before. She denies any other medical history.  She does not go to the doctor because she does not feel bad. She follows closely with ophthalmology for acute open angle glaucoma per chart review.   In the ED, patient was afebrile and noted to be tachycardic with heart rate up to 130s and hypotensive with pressures in high 80s to 90s over 19F and 79K diastolic. EKG was significant for atrial fib with PVCs and ST elevations in inferior leads.  There was also note of possible infarct in leads I III, aVR, aVL and aVF. i-STAT troponin was negative.  CMP was significant for potassium of 3.0, glucose 166, albumin 3.2, GFR 59.  ED was going to start her on Cardizem drip for A. fib, however her blood pressure was noted to be low so  she received 1 L of normal saline. Portable chest showed no active disease.   Review Of Systems: Per HPI, including the following: Review of Systems  Constitutional: Negative for chills, fever and malaise/fatigue.  HENT: Negative for congestion and hearing loss.   Eyes: Positive for blurred vision.  Respiratory: Negative for cough and shortness of breath.   Cardiovascular: Positive for chest pain. Negative for leg swelling.  Gastrointestinal: Positive for vomiting. Negative for abdominal pain, heartburn and nausea.       1 episode of vomiting after lunch today.   Skin: Negative for rash.  Neurological: Negative for dizziness, weakness and headaches.    Past Medical History: Past Medical History:  Diagnosis Date  . Arthritis   . Asthma    . Diabetes mellitus   . GERD (gastroesophageal reflux disease)   . Hyperlipidemia ~2005  . Hypertension    Patient Active Problem List   Diagnosis Date Noted  . Paroxysmal atrial fibrillation ([place]) 01/08/2017  . Right ankle pain 01/07/2017  . Acute gout of right ankle 01/07/2017  . Chest pain 05/20/2015  . Hypertension 02/27/2012  . Hyperlipidemia 02/27/2012  . Diabetes type 2, controlled ([place]) 02/27/2012    Past Surgical History: Past Surgical History:  Procedure Laterality Date  . CHOLECYSTECTOMY  30 yrs ago  . EYE SURGERY    . FRACTURE SURGERY  ~2006   Rt knee  . HERNIA REPAIR  ~5 yrs ago   umbilical    Social History: Used to smoke in 1974, stopped then. Smoked about 5 years. Drinks alcohol occasionally, about 1x per week [place] ice tea.  Additional social history: Lives at home alone, daughter lives in [place]. Retired from [employer].   Family History: Both mom and dad had HTN, has 1 daughter who has bilateral breast cancer.   Allergies and Medications: No Known Allergies No current facility-administered medications on file prior to encounter.    Current Outpatient Medications on File Prior to Encounter  Medication Sig Dispense Refill  . aspirin 81 MG chewable tablet Chew 162 mg by mouth once.    . dorzolamide (TRUSOPT) 2 % ophthalmic solution Place 1 drop into both eyes 2 (two) times daily.  12  . dorzolamide-timolol (COSOPT) 22.3-6.8 MG/ML ophthalmic solution Place 1 drop into both eyes 2 (two) times daily.  4    Objective: Physical Exam BP 97/66 (BP Location: Right Arm)   Pulse 90   Temp 98 F (36.7 C) (Oral)   Resp 18   Ht 5\' 2"  (1.575 m)   Wt 77.1 kg   SpO2 97%   BMI 31.09 kg/m  Wt Readings from Last 3 Encounters:  10/20/18 77.1 kg  02/27/18 75.5 kg  09/26/17 75.1 kg   Gen: NAD, alert, non-toxic, well-nourished, well-appearing, pleasant, smiling and laughing  HEENT: Normocephaic, atraumatic. Pupils not reactive to light (dilated  at eye doctor earlier today). Clear conjuctiva, no scleral icterus and injection. Normal EOM.  Hearing intact. Neck supple with no LAD, nodules, or gross abnormality. Nares patent with no discharge. Oropharynx without erythema and lesions.  Tonsils nonswollen and without exudate.   CV: Irregularly irregular. No murmur, gallops, rubs appreciated. Normal cap refill bilaterally.  RP 2+ bilaterally. No BLEE. Resp: CTAB.  No w/r/r/abnormal breath sounds.  No increased WOB appreciated. Abd: NTND on palpation to all 4 quadrants. No masses palpated. Positive bowel sounds. Back: No CVAT. Spine symmetric with no TTP on spinous processes.  Skin:  Other wise, no obvious rashes, lesions, or trauma.  Normal turgor.  MSK: Normal ROM. Normal muscle strength and tone.  Neuro: Alert and oriented x4. Cranial nerves II through VI grossly intact. Gait not appreciated.  Psych: Cooperative with exam.  Normal speech. Pleasant. Makes good eye contact. Genitourinary: deferred.   Labs and Imaging: CBC BMET  Recent Labs  Lab 10/20/18 1419  WBC 5.8  HGB 14.4  HCT 45.1  PLT 212   Recent Labs  Lab 10/20/18 1419  NA 137  K 3.0*  CL 102  CO2 27  BUN 12  CREATININE 0.89  GLUCOSE 166*  CALCIUM 8.9     11/12 1400 EKG: Atrial fibrillation. Paired ventricular premature complexes. Low voltage, extremity and precordial leads. Repolarization abnormality, prob rate related. Minimal ST elevation, inferior leads. Artifact in lead(s) I III aVR aVL aVF  Imaging/Diagnostic Tests: Dg Chest Portable 1 View  Result Date: 10/20/2018 CLINICAL DATA:  Intermittent chest pain for a week, dizziness EXAM: PORTABLE CHEST 1 VIEW COMPARISON:  Chest x-ray 05/20/2015 FINDINGS: No pneumonia or effusion is seen. Probable mild chronic fibrotic change is present at the bases. Mediastinal and hilar contours are unremarkable and mild cardiomegaly is stable. No bony abnormality is seen. IMPRESSION: No active disease. Electronically Signed   By:  Ivar Drape M.D.   On: 10/20/2018 15:04     Wilber Oliphant, M.D. 10/20/2018, 3:48 PM PGY-1, [place] Intern pager: [phone], text pages welcome  FPTS Upper-Level Resident Addendum   I have independently interviewed and examined the patient. I have discussed the above with the original author and agree with their documentation. My edits for correction/addition/clarification are in blue. Please see also any attending notes.    Ralene Ok, MD PGY-3, [place] Service pager: [phone] (text pages welcome through [facility])

## 2018-10-20 NOTE — ED Provider Notes (Signed)
[facility] EMERGENCY DEPARTMENT Provider Note   CSN: [id number] Arrival date & time: 10/20/18  1353     History   Chief Complaint Chief Complaint  Patient presents with  . Chest Pain  . Atrial Fibrillation    HPI Chelsea Gallegos is a 84 y.o. female.  Patient is an 87-year-old female with past medical history of diabetes, hypertension who takes no medications presenting with complaints of chest discomfort.  She reports intermittent tightness in her chest that occurs with ambulation for the past week.  She experiences occasional shortness of breath but denies any nausea diaphoresis, or radiation to the arm or jaw.  The history is provided by the patient.  Chest Pain   This is a new problem. Episode onset: 1 week ago. Episode frequency: Intermittent. The problem has been gradually worsening. The pain is associated with walking. The pain is present in the substernal region. The pain is mild. The quality of the pain is described as pressure-like. The pain does not radiate. Associated symptoms include shortness of breath. Pertinent negatives include no cough, no diaphoresis and no nausea. She has tried nothing for the symptoms. The treatment provided no relief. There are no known risk factors.  Atrial Fibrillation  Associated symptoms include chest pain and shortness of breath.    Past Medical History:  Diagnosis Date  . Arthritis   . Asthma   . Diabetes mellitus   . GERD (gastroesophageal reflux disease)   . Hyperlipidemia ~2005  . Hypertension     Patient Active Problem List   Diagnosis Date Noted  . Paroxysmal atrial fibrillation ([place]) 01/08/2017  . Right ankle pain 01/07/2017  . Acute gout of right ankle 01/07/2017  . Chest pain 05/20/2015  . Hypertension 02/27/2012  . Hyperlipidemia 02/27/2012  . Diabetes type 2, controlled ([place]) 02/27/2012    Past Surgical History:  Procedure Laterality Date  . CHOLECYSTECTOMY  30 yrs ago  . EYE SURGERY    .  FRACTURE SURGERY  ~2006   Rt knee  . HERNIA REPAIR  ~5 yrs ago   umbilical     OB History   None      Home Medications    Prior to Admission medications   Not on File    Family History No family history on file.  Social History Social History   Tobacco Use  . Smoking status: Former Smoker    Last attempt to quit: 12/09/1972    Years since quitting: 45.8  . Smokeless tobacco: Never Used  Substance Use Topics  . Alcohol use: Yes    Comment: occasionally  . Drug use: No     Allergies   Patient has no known allergies.   Review of Systems Review of Systems  Constitutional: Negative for diaphoresis.  Respiratory: Positive for shortness of breath. Negative for cough.   Cardiovascular: Positive for chest pain.  Gastrointestinal: Negative for nausea.  All other systems reviewed and are negative.    Physical Exam Updated Vital Signs There were no vitals taken for this visit.  Physical Exam  Constitutional: She is oriented to person, place, and time. She appears well-developed and well-nourished. No distress.  HENT:  Head: Normocephalic and atraumatic.  Neck: Normal range of motion. Neck supple.  Cardiovascular: An irregularly irregular rhythm present. Tachycardia present. Exam reveals no gallop and no friction rub.  No murmur heard. Pulmonary/Chest: Effort normal and breath sounds normal. No respiratory distress. She has no wheezes.  Abdominal: Soft. Bowel sounds are normal.  She exhibits no distension. There is no tenderness.  Musculoskeletal: Normal range of motion.       Right lower leg: She exhibits no edema.       Left lower leg: She exhibits no edema.  Neurological: She is alert and oriented to person, place, and time.  Skin: Skin is warm and dry. She is not diaphoretic.  Nursing note and vitals reviewed.    ED Treatments / Results  Labs (all labs ordered are listed, but only abnormal results are displayed) Labs Reviewed  COMPREHENSIVE METABOLIC  PANEL  CBC WITH DIFFERENTIAL/PLATELET  I-STAT TROPONIN, ED    EKG None  Radiology No results found.  Procedures Procedures (including critical care time)  Medications Ordered in ED Medications - No data to display   Initial Impression / Assessment and Plan / ED Course  I have reviewed the triage vital signs and the nursing notes.  Pertinent labs & imaging results that were available during my care of the patient were reviewed by me and considered in my medical decision making (see chart for details).  Patient presents with a one-week history of intermittent chest discomfort that seems to occur with exertion.  Her work-up shows atrial fibrillation with rapid ventricular response.  She has had this in the past, however it has been several years ago and she has been lost to follow-up.  Remainder of her work-up is otherwise unremarkable.  Her troponin is negative and chest x-ray is clear.  She will be started on Cardizem for rate control and admitted to the family medicine service.  CRITICAL CARE Performed by: Veryl Speak Total critical care time: .35 minutes Critical care time was exclusive of separately billable procedures and treating other patients. Critical care was necessary to treat or prevent imminent or life-threatening deterioration. Critical care was time spent personally by me on the following activities: development of treatment plan with patient and/or surrogate as well as nursing, discussions with consultants, evaluation of patient's response to treatment, examination of patient, obtaining history from patient or surrogate, ordering and performing treatments and interventions, ordering and review of laboratory studies, ordering and review of radiographic studies, pulse oximetry and re-evaluation of patient's condition.   Final Clinical Impressions(s) / ED Diagnoses   Final diagnoses:  None    ED Discharge Orders    None       Veryl Speak, MD 10/20/18  1609

## 2018-10-21 ENCOUNTER — Inpatient Hospital Stay (HOSPITAL_COMMUNITY): Payer: Medicare Other

## 2018-10-21 ENCOUNTER — Encounter (HOSPITAL_COMMUNITY): Payer: Self-pay | Admitting: Cardiovascular Disease

## 2018-10-21 ENCOUNTER — Inpatient Hospital Stay (HOSPITAL_COMMUNITY)
Admission: EM | Disposition: A | Discharge status: Home / Self Care | Payer: Self-pay | Source: Home / Self Care | Attending: Family Medicine

## 2018-10-21 DIAGNOSIS — I959 Hypotension, unspecified: Secondary | ICD-10-CM | POA: Diagnosis present

## 2018-10-21 HISTORY — PX: LEFT HEART CATH AND CORONARY ANGIOGRAPHY: CATH118249

## 2018-10-21 LAB — GLUCOSE, CAPILLARY
GLUCOSE-CAPILLARY: 123 mg/dL — AB (ref 70–99)
Glucose-Capillary: 158 mg/dL — ABNORMAL HIGH (ref 70–99)
Glucose-Capillary: 118 mg/dL — ABNORMAL HIGH (ref 70–99)

## 2018-10-21 LAB — BASIC METABOLIC PANEL
ANION GAP: 5 (ref 5–15)
BUN: 13 mg/dL (ref 8–23)
CALCIUM: 8.3 mg/dL — AB (ref 8.9–10.3)
CO2: 23 mmol/L (ref 22–32)
Chloride: 110 mmol/L (ref 98–111)
Creatinine, Ser: 0.94 mg/dL (ref 0.44–1.00)
GFR calc Af Amer: >60 mL/min (ref >60)
GFR calc non Af Amer: 55 mL/min — ABNORMAL LOW (ref >60)
GLUCOSE: 138 mg/dL — AB (ref 70–99)
POTASSIUM: 3.7 mmol/L (ref 3.5–5.1)
Sodium: 138 mmol/L (ref 135–145)

## 2018-10-21 LAB — LIPID PANEL
Cholesterol: 198 mg/dL (ref 0–200)
HDL: 60 mg/dL (ref >40)
LDL Cholesterol: 123 mg/dL — ABNORMAL HIGH (ref 0–99)
Total CHOL/HDL Ratio: 3.3 RATIO
Triglycerides: 76 mg/dL (ref <150)
VLDL: 15 mg/dL (ref 0–40)

## 2018-10-21 LAB — CBC
HCT: 38.4 % (ref 36.0–46.0)
HEMOGLOBIN: 12.6 g/dL (ref 12.0–15.0)
MCH: 29.6 pg (ref 26.0–34.0)
MCHC: 32.8 g/dL (ref 30.0–36.0)
MCV: 90.1 fL (ref 80.0–100.0)
NRBC: 0 % (ref 0.0–0.2)
Platelets: 193 10*3/uL (ref 150–400)
RBC: 4.26 MIL/uL (ref 3.87–5.11)
RDW: 13.5 % (ref 11.5–15.5)
WBC: 6.6 10*3/uL (ref 4.0–10.5)

## 2018-10-21 LAB — HEPARIN LEVEL (UNFRACTIONATED): HEPARIN UNFRACTIONATED: 0.33 [IU]/mL (ref 0.30–0.70)

## 2018-10-21 LAB — BRAIN NATRIURETIC PEPTIDE: B Natriuretic Peptide: 337.1 pg/mL — ABNORMAL HIGH (ref 0.0–100.0)

## 2018-10-21 LAB — ECHOCARDIOGRAM COMPLETE
HEIGHTINCHES: 62 in
Weight: 2716.07 oz

## 2018-10-21 LAB — POCT ACTIVATED CLOTTING TIME: Activated Clotting Time: 142 seconds

## 2018-10-21 LAB — TROPONIN I

## 2018-10-21 LAB — HEMOGLOBIN A1C
Hgb A1c MFr Bld: 6.6 % — ABNORMAL HIGH (ref 4.8–5.6)
Mean Plasma Glucose: 142.72 mg/dL

## 2018-10-21 SURGERY — LEFT HEART CATH AND CORONARY ANGIOGRAPHY
Anesthesia: LOCAL

## 2018-10-21 MED ORDER — SODIUM CHLORIDE 0.9 % IV SOLN: 250.0000 mL | INTRAVENOUS | Status: DC | PRN

## 2018-10-21 MED ORDER — DIGOXIN 0.25 MG/ML IJ SOLN
0.2500 mg | Freq: Once | INTRAMUSCULAR | Status: AC
  Administered 2018-10-21: 0.25 mg via INTRAVENOUS
  Filled 2018-10-21: qty 2

## 2018-10-21 MED ORDER — LIDOCAINE HCL (PF) 1 % IJ SOLN
INTRAMUSCULAR | Status: DC | PRN
  Administered 2018-10-21: 13 mL via SUBCUTANEOUS

## 2018-10-21 MED ORDER — SODIUM CHLORIDE 0.9% FLUSH: 3.0000 mL | Freq: Two times a day (BID) | INTRAVENOUS | Status: DC

## 2018-10-21 MED ORDER — DIGOXIN 125 MCG PO TABS
0.1250 mg | ORAL_TABLET | Freq: Every day | ORAL | Status: DC
  Administered 2018-10-21: 0.125 mg via ORAL
  Filled 2018-10-21: qty 1

## 2018-10-21 MED ORDER — HEPARIN (PORCINE) IN NACL 1000-0.9 UT/500ML-% IV SOLN
INTRAVENOUS | Status: DC | PRN
  Administered 2018-10-21 (×2): 500 mL

## 2018-10-21 MED ORDER — LIDOCAINE HCL (PF) 1 % IJ SOLN
INTRAMUSCULAR | Status: AC
  Filled 2018-10-21: qty 30

## 2018-10-21 MED ORDER — IOHEXOL 350 MG/ML SOLN
INTRAVENOUS | Status: DC | PRN
  Administered 2018-10-21: 51 mL via INTRA_ARTERIAL

## 2018-10-21 MED ORDER — SODIUM CHLORIDE 0.9 % IV SOLN
INTRAVENOUS | Status: AC
  Administered 2018-10-21: 14:00:00 via INTRAVENOUS

## 2018-10-21 MED ORDER — HEPARIN (PORCINE) IN NACL 1000-0.9 UT/500ML-% IV SOLN
INTRAVENOUS | Status: AC
  Filled 2018-10-21: qty 1000

## 2018-10-21 MED ORDER — SODIUM CHLORIDE 0.9% FLUSH: 3.0000 mL | INTRAVENOUS | Status: DC | PRN

## 2018-10-21 SURGICAL SUPPLY — 7 items
CATH INFINITI 5FR MULTPACK ANG (CATHETERS) ×1 IMPLANT
KIT HEART LEFT (KITS) ×2 IMPLANT
PACK CARDIAC CATHETERIZATION (CUSTOM PROCEDURE TRAY) ×2 IMPLANT
SHEATH PINNACLE 5F 10CM (SHEATH) ×1 IMPLANT
SYR MEDRAD MARK V 150ML (SYRINGE) ×2 IMPLANT
TRANSDUCER W/STOPCOCK (MISCELLANEOUS) ×2 IMPLANT
WIRE EMERALD 3MM-J .035X150CM (WIRE) ×1 IMPLANT

## 2018-10-21 NOTE — Progress Notes (Addendum)
Family Medicine Teaching Service Daily Progress Note Intern Pager: [phone]  Patient name: Chelsea Gallegos Medical record number: [id number] Date of birth: 12-19-1933 Age: 84 y.o. Gender: female  Primary Care Provider: Patient, No Pcp Per Consultants: Cardiology Code Status: Limited code  Pt Overview and Major Events to Date:  Admitted: 10/20/2018  Hospital Day: 2   Assessment and Plan: Chelsea Gallegos is a 84 y.o. female presenting with intermittent chest pain worse with activity x 1 week . PMH is significant for Afib, HTN, DMII, HLD GERD which are not treated with any medications as she has not seen a doctor in three years.   #Chest pain concerning for ACS ACS vs tamponade vs GERD/esophageal dysmotility, Initial EKG significant for minor ST elevations in inferior leads. AM EKG today afib w/ RVR, t wave abnormalities.  Trops negative x 3. BNP 337.1. LDL 123. BMP and CBC wnl.  Continuous Cardiac monitoring & pulse ox  Vitals per unit protocol  Out of bed with assistance, fall risk  NPO for cardiac event  Cardiology ? Cath today  Echocardiogram today.  Plans to consult PT/OT after initial cardiac work up.   Continue ASA    #Atrial fibrillation w/ RVR Continued a fib with RVR this morning on EKG. S/p digoxin x 1 in ED. Started on metoprolol per cardiology overnight which may have aided in decreasing HR, but Bps remain low this AM.  Cardiology Following   Anticoag with heparin 800 units/hr for Cath today  cardizem 100mg  QID   MAR: held at 1000 this AM as low BP  Metoprolol 25mg  BID   D/C'ed metoprolol as patient is 74/58 this morning.   Continue ASA 81  Daily AM EKG's  Discuss long term anticoag  Monitor Rate  Will have to consult care management closer to discharge for help with medications and access as patient has been non-compliant in the past.   #Hypotension Continues to be low this morning. 75/58. As patient does not have great follow up,  this may be where she lives usually. A fib w/ poor CO vs. Heart failure. Hypothyroidism ruled out with normal TSH. BNP 337.1  D/c'ed metop (indicated for afib)   Hold all antihypertensives  #DMII A1c 6.6. Patient is not currently on any medication. CBGs stable 116, 158.   Will need outpatient follow up   CBGs q12hours  Continue to follow CBGs. Currently, no need for insulin.   #HTN Patient is currently hypotensive.  Continue to monitor blood pressures  #HLD Patient has history of HLD and is currently not on a statin.  Increasing the risk of CAD greatly.  Lipid panel  Will likely start statin during this admission, especially with history of diabetes  #Hypokalemia K+ this AM 3.7  KDUR 62mEq BID per cardiology  #Open Angle Glaucoma early + stable retinal vein branch occlusion left eye  Continue home dorzolamide and Cosopt for glaucoma.   Contact ophthalmologist for recent medication changes.   F: s/p 1L for soft BP E: Replete PRN N: NPO for cards procedures GI ppx: none DVT ppx: Heparin 800 units/hr (11/13 0300)  Future Labs: AM BMP Disposition: Telemetry  Subjective  Patient reports doing well overnight. She has no complaints this morning. She is smiling and welcoming.   Objective:   Vital Signs Temp:  [97.7 F (36.5 C)-98 F (36.7 C)] 97.7 F (36.5 C) (11/12 1934) Pulse Rate:  [71-144] 94 (11/12 1931) Resp:  [12-19] 18 (11/12 1931) BP: (74-97)/(58-83) 74/58 (11/13 0607) SpO2:  [92 %-  98 %] 97 % (11/12 1931) Weight:  [77 kg-77.1 kg] 77 kg (11/13 0607)  Intake/Output  Intake/Output Summary (Last 24 hours) at 10/21/2018 0729 Last data filed at 10/21/2018 0300 Gross per 24 hour  Intake 1486.49 ml  Output -  Net 1486.49 ml   Physical Exam:  Gen: NAD, alert, non-toxic, well-appearing, sitting comfortably  Skin: Warm and dry HEENT: NCAT.  MMM.  CV: RRR.  Normal S1-S2. No BLEE. Resp: CTAB. No increased WOB Abd: NTND on palpation to all 4  quadrants.  Pos bowel sounds Extremities: moves extremities spontaneously. Warm and well perfused.   Laboratory: Recent Labs  Lab 10/20/18 1419 10/21/18 0258  WBC 5.8 6.6  HGB 14.4 12.6  HCT 45.1 38.4  PLT 212 193   Recent Labs  Lab 10/20/18 1419 10/21/18 0258  NA 137 138  K 3.0* 3.7  CL 102 110  CO2 27 23  BUN 12 13  CREATININE 0.89 0.94  CALCIUM 8.9 8.3*  PROT 7.1  --   BILITOT 0.7  --   ALKPHOS 83  --   ALT 18  --   AST 16  --   GLUCOSE 166* 138*    Imaging/Diagnostic Tests: Dg Chest Portable 1 View  Result Date: 10/20/2018 CLINICAL DATA:  Intermittent chest pain for a week, dizziness EXAM: PORTABLE CHEST 1 VIEW COMPARISON:  Chest x-ray 05/20/2015 FINDINGS: No pneumonia or effusion is seen. Probable mild chronic fibrotic change is present at the bases. Mediastinal and hilar contours are unremarkable and mild cardiomegaly is stable. No bony abnormality is seen. IMPRESSION: No active disease. Electronically Signed   By: Ivar Drape M.D.   On: 10/20/2018 15:04     Wilber Oliphant, MD 10/21/2018, 7:28 AM PGY-1, [place] Intern pager: [phone], text pages welcome

## 2018-10-21 NOTE — Interval H&P Note (Signed)
History and Physical Interval Note:  10/21/2018 12:34 PM  Chelsea Gallegos  has presented today for surgery, with the diagnosis of cp  The various methods of treatment have been discussed with the patient and family. After consideration of risks, benefits and other options for treatment, the patient has consented to  Procedure(s): LEFT HEART CATH AND CORONARY ANGIOGRAPHY (N/A) as a surgical intervention .  The patient's history has been reviewed, patient examined, no change in status, stable for surgery.  I have reviewed the patient's chart and labs.  Questions were answered to the patient's satisfaction.     [name]

## 2018-10-21 NOTE — Progress Notes (Signed)
ANTICOAGULATION CONSULT NOTE - Initial Consult  Pharmacy Consult for heparin Indication: chest pain/ACS  No Known Allergies  Patient Measurements: Height: 5\' 2"  (157.5 cm) Weight: 169 lb 12.1 oz (77 kg) IBW/kg (Calculated) : 50.1 Heparin Dosing Weight: 67 kg  Vital Signs: BP: 74/58 (11/13 0607)  Labs: Recent Labs    10/20/18 1419 10/20/18 1840 10/20/18 2230 10/21/18 0258  HGB 14.4  --   --  12.6  HCT 45.1  --   --  38.4  PLT 212  --   --  193  HEPARINUNFRC  --   --   --  0.33  CREATININE 0.89  --   --  0.94  TROPONINI  --  <0.03 <0.03 <0.03    Estimated Creatinine Clearance: 45.1 mL/min (by C-G formula based on SCr of 0.94 mg/dL).   Medical History: Past Medical History:  Diagnosis Date  . Arthritis    "arms" (10/20/2018)  . Asthma   . GERD (gastroesophageal reflux disease)   . History of gout   . History of hiatal hernia   . History of kidney stones   . Hyperlipidemia ~2005  . Hypertension   . Refusal of blood transfusions as patient is Jehovah's Witness   . Stroke [facility]) 04/2017   "dr told me I had a stroke in my left eye" (10/20/2018)  . Type II diabetes mellitus (HCC)     Medications:  Scheduled:  . aspirin  81 mg Oral Once  . diltiazem  30 mg Oral QID  . dorzolamide  1 drop Both Eyes BID  . dorzolamide-timolol  1 drop Both Eyes BID  . insulin aspart  0-5 Units Subcutaneous QHS  . insulin aspart  0-9 Units Subcutaneous TID WC  . potassium chloride  20 mEq Oral BID  . sodium chloride flush  3 mL Intravenous Q12H    Assessment: 63 yof presenting with atrial fibrillation and exertional recurrent chest pain. On heparin gtt for ACS/Afib. No anticoag PTA; CHADs-VASc score 5. Last heparin level was therapeutic at 0.33. CBC stable.  Goal of Therapy:  Heparin level 0.3-0.7 units/ml Monitor platelets by anticoagulation protocol: Yes   Plan:  Increase heparin gtt slightly to 850 units/hr Monitor daily heparin level, CBC, s/s of bleed  Elenor Quinones, PharmD, BCPS Clinical Pharmacist Phone number [phone] 10/21/2018 8:15 AM

## 2018-10-21 NOTE — CV Procedure (Signed)
2D echo attempted, patient going to cath lab. Will try echo post cath.

## 2018-10-21 NOTE — Plan of Care (Signed)

## 2018-10-21 NOTE — Progress Notes (Signed)
  Echocardiogram 2D Echocardiogram has been performed.  Bobbye Charleston 10/21/2018, 3:24 PM

## 2018-10-21 NOTE — Progress Notes (Addendum)
Site area: rt groin fa sheath Site Prior to Removal:  Level 0 Pressure Applied For:  20 minutes Manual:   yes Patient Status During Pull:  stable Post Pull Site:  Level  0 Post Pull Instructions Given:  yes Post Pull Pulses Present: rt dp palpable Dressing Applied:  Gauze and tegaderm Bedrest begins @ 1340 Comments:

## 2018-10-22 ENCOUNTER — Telehealth: Payer: Self-pay | Admitting: Family Medicine

## 2018-10-22 LAB — BASIC METABOLIC PANEL
ANION GAP: 8 (ref 5–15)
BUN: 12 mg/dL (ref 8–23)
CALCIUM: 8.4 mg/dL — AB (ref 8.9–10.3)
CHLORIDE: 113 mmol/L — AB (ref 98–111)
CO2: 19 mmol/L — AB (ref 22–32)
Creatinine, Ser: 0.86 mg/dL (ref 0.44–1.00)
GFR calc non Af Amer: >60 mL/min (ref >60)
Glucose, Bld: 132 mg/dL — ABNORMAL HIGH (ref 70–99)
Potassium: 3.9 mmol/L (ref 3.5–5.1)
Sodium: 140 mmol/L (ref 135–145)

## 2018-10-22 LAB — CBC
HEMATOCRIT: 38 % (ref 36.0–46.0)
HEMOGLOBIN: 12 g/dL (ref 12.0–15.0)
MCH: 29.3 pg (ref 26.0–34.0)
MCHC: 31.6 g/dL (ref 30.0–36.0)
MCV: 92.7 fL (ref 80.0–100.0)
NRBC: 0 % (ref 0.0–0.2)
Platelets: 178 10*3/uL (ref 150–400)
RBC: 4.1 MIL/uL (ref 3.87–5.11)
RDW: 13.9 % (ref 11.5–15.5)
WBC: 5.6 10*3/uL (ref 4.0–10.5)

## 2018-10-22 LAB — GLUCOSE, CAPILLARY
GLUCOSE-CAPILLARY: 95 mg/dL (ref 70–99)
Glucose-Capillary: 102 mg/dL — ABNORMAL HIGH (ref 70–99)

## 2018-10-22 LAB — URINALYSIS, ROUTINE W REFLEX MICROSCOPIC
BACTERIA UA: NONE SEEN
BILIRUBIN URINE: NEGATIVE
GLUCOSE, UA: NEGATIVE mg/dL
HGB URINE DIPSTICK: NEGATIVE
Ketones, ur: NEGATIVE mg/dL
NITRITE: NEGATIVE
Protein, ur: NEGATIVE mg/dL
SPECIFIC GRAVITY, URINE: 1.031 — AB (ref 1.005–1.030)
pH: 5 (ref 5.0–8.0)

## 2018-10-22 LAB — HEPARIN LEVEL (UNFRACTIONATED): Heparin Unfractionated: 0.14 IU/mL — ABNORMAL LOW (ref 0.30–0.70)

## 2018-10-22 MED ORDER — APIXABAN 2.5 MG PO TABS: 2.5000 mg | ORAL_TABLET | Freq: Two times a day (BID) | ORAL | 3 refills | Status: DC

## 2018-10-22 MED ORDER — POTASSIUM CHLORIDE CRYS ER 10 MEQ PO TBCR: 10.0000 meq | EXTENDED_RELEASE_TABLET | Freq: Two times a day (BID) | ORAL | 3 refills | Status: DC

## 2018-10-22 MED ORDER — DILTIAZEM HCL 60 MG PO TABS
60.0000 mg | ORAL_TABLET | Freq: Two times a day (BID) | ORAL | Status: DC
  Administered 2018-10-22: 60 mg via ORAL
  Filled 2018-10-22: qty 1

## 2018-10-22 MED ORDER — DIGOXIN 125 MCG PO TABS: 0.1250 mg | ORAL_TABLET | ORAL | 3 refills | Status: DC

## 2018-10-22 MED ORDER — ATORVASTATIN CALCIUM 40 MG PO TABS: 40.0000 mg | ORAL_TABLET | Freq: Every day | ORAL | 1 refills | Status: DC

## 2018-10-22 MED ORDER — DILTIAZEM HCL 60 MG PO TABS: 60.0000 mg | ORAL_TABLET | Freq: Two times a day (BID) | ORAL | 3 refills | Status: DC

## 2018-10-22 MED ORDER — APIXABAN 5 MG PO TABS: 5.0000 mg | ORAL_TABLET | Freq: Two times a day (BID) | ORAL | 0 refills | Status: DC

## 2018-10-22 MED ORDER — APIXABAN 5 MG PO TABS: 5.0000 mg | ORAL_TABLET | Freq: Two times a day (BID) | ORAL | Status: DC

## 2018-10-22 MED ORDER — ATORVASTATIN CALCIUM 40 MG PO TABS: 40.0000 mg | ORAL_TABLET | Freq: Every day | ORAL | Status: DC

## 2018-10-22 MED ORDER — APIXABAN 2.5 MG PO TABS
2.5000 mg | ORAL_TABLET | Freq: Two times a day (BID) | ORAL | Status: DC
  Administered 2018-10-22: 2.5 mg via ORAL
  Filled 2018-10-22: qty 1

## 2018-10-22 MED ORDER — METFORMIN HCL 500 MG PO TABS
250.0000 mg | ORAL_TABLET | Freq: Every day | ORAL | Status: DC
  Administered 2018-10-22: 250 mg via ORAL
  Filled 2018-10-22: qty 1

## 2018-10-22 MED ORDER — DIGOXIN 125 MCG PO TABS: 0.0625 mg | ORAL_TABLET | Freq: Every day | ORAL | Status: DC

## 2018-10-22 MED ORDER — APIXABAN 2.5 MG PO TABS: 2.5000 mg | ORAL_TABLET | Freq: Two times a day (BID) | ORAL | Status: DC

## 2018-10-22 MED ORDER — METFORMIN HCL 500 MG PO TABS: 250.0000 mg | ORAL_TABLET | Freq: Every day | ORAL | 3 refills | Status: DC

## 2018-10-22 MED ORDER — METFORMIN HCL 500 MG PO TABS: 500.0000 mg | ORAL_TABLET | Freq: Two times a day (BID) | ORAL | 0 refills | Status: DC

## 2018-10-22 MED FILL — metFORMIN HCL 500 MG TABS: 500 | 30 days supply | Qty: 60 | Fill #0

## 2018-10-22 MED FILL — ELIQUIS 5 MG TABLET: 5 | 30 days supply | Qty: 60 | Fill #0

## 2018-10-22 NOTE — Discharge Summary (Signed)
[facility] Discharge Summary  Patient name: Chelsea Gallegos Medical record number: [id number] Date of birth: 1934/02/28 Age: 84 y.o. Gender: female Date of Admission: 10/20/2018  Date of Discharge: 10/22/2018  Admitting Physician: Blane Ohara McDiarmid, MD  Primary Care Provider: Wilber Oliphant, MD Consultants: Cardiology  Indication for Hospitalization: Paroxysmal atrial fibrillation [facility])   Discharge Diagnoses/Problem List:  Principal Problem:   Paroxysmal atrial fibrillation [facility]) Active Problems:   Hypertension   Hyperlipidemia   Diabetes type 2, controlled ([place])   Chest pain   Atrial fibrillation with rapid ventricular response (HCC)   Hypotension  Disposition: Discharge to home  Discharge Condition: Stable  Discharge Exam:  BP 97/68 (BP Location: Left Arm)   Pulse 65   Temp 97.6 F (36.4 C) (Oral)   Resp 15   Ht 5\' 2"  (1.575 m)   Wt 76.9 kg   SpO2 100%   BMI 31.02 kg/m    Physical Exam:  Gen: NAD, alert, non-toxic, well-appearing, sitting comfortably  Skin: Warm and dry HEENT: NCAT.  MMM.  CV: RRR.  Normal S1-S2. No BLEE. Resp: CTAB. No increased WOB Abd: NTND on palpation to all 4 quadrants.  Pos bowel sounds Extremities: moves extremities spontaneously. Warm and well perfused.   Brief Hospital Course:  Chelsea Gallegos is a 84 y.o. female with past medical history significant for diabetes, hyperlipidemia, hypertension and no physician follow-up for 3 years, who presented with chest pain, dizziness and found to be in A. fib with RVR and admitted for ACS rule out.  Initial work up with significant for A. fib with RVR and an EKG, troponins negative x3, BNP 337.1, LDL 123.  The remainder of lipid panel, BMP and CBC were within normal limits. Consulting providers included cardiology.  The recommendations were for catheterization and echocardiogram.  Catheterization showed 70% stenosis to distal LAD.  Cardiology recommended continuing  with ASA 81 for management.  For her A. fib with RVR, patient was placed on heparin, Cardizem, metoprolol.  Metoprolol was discontinued as her blood pressures were low in the 02O systolic, but responded well to digoxin.  On her second morning, she spontaneously cardioverted via medication.   Patient was ultimately discharged on Cardizem 60 mg twice daily, apixaban 2.5 mg twice daily, 500 mg metformin BID, digoxin 0.125mg  daily every MWF, KDUR 10mg  BID.  They also recommended follow-up with Dr. Doylene Canard in 1 week after discharge.   Issues for Follow Up:  1. Symptom improvement of chest pain and fast heart rate. 2. Repeat EKG if tachycardic.   3. Schedule follow up with Cardiology outpatient in one week from D/C 4. A1c was 6.6.  Patient currently not on any medication.  Will need outpatient follow-up.  Patient started on 500 mg BID of metformin per cardiology.  Continue to monitor A1c in 3 months.  Consider increasing metformin dosing if patient tolerates. 5. Chronic hypotension -resolved? Patient consistently low during admission. 6. Hyperlipidemia -started low intensity statin.  Patient will need LFTs in 3 months with PCP. 7. Patient started on K-Dur 10 mEq twice daily daily for hypokalemia.  Check BMP. 8. Case management and transition of care pharmacy consulted during admission.  Ensure patient is taking medications.  Significant Procedures: Catheterization, Echocardiogram   Procedure Orders     ED EKG     EKG 12-Lead     EKG 12-lead     EKG 12-Lead     EKG 12-Lead     EKG  EKG 12-Lead     ECHOCARDIOGRAM COMPLETE  Significant Labs and Imaging:  Recent Labs  Lab 10/20/18 1419 10/21/18 0258 10/22/18 0501  WBC 5.8 6.6 5.6  HGB 14.4 12.6 12.0  HCT 45.1 38.4 38.0  PLT 212 193 178   Recent Labs  Lab 10/20/18 1419 10/20/18 1712 10/21/18 0258 10/22/18 0501  NA 137  --  138 140  K 3.0*  --  3.7 3.9  CL 102  --  110 113*  CO2 27  --  23 19*  GLUCOSE 166*  --  138* 132*  BUN 12   --  13 12  CREATININE 0.89  --  0.94 0.86  CALCIUM 8.9  --  8.3* 8.4*  MG  --  2.2  --   --   ALKPHOS 83  --   --   --   AST 16  --   --   --   ALT 18  --   --   --   ALBUMIN 3.2*  --   --   --     Dg Chest Portable 1 View  Result Date: 10/20/2018 CLINICAL DATA:  Intermittent chest pain for a week, dizziness EXAM: PORTABLE CHEST 1 VIEW COMPARISON:  Chest x-ray 05/20/2015 FINDINGS: No pneumonia or effusion is seen. Probable mild chronic fibrotic change is present at the bases. Mediastinal and hilar contours are unremarkable and mild cardiomegaly is stable. No bony abnormality is seen. IMPRESSION: No active disease. Electronically Signed   By: Ivar Drape M.D.   On: 10/20/2018 15:04   Discharge Medications:  Allergies as of 10/22/2018   No Known Allergies     Medication List    TAKE these medications   apixaban 5 MG Tabs tablet Commonly known as:  ELIQUIS Take 1 tablet (5 mg total) by mouth 2 (two) times daily.   aspirin 81 MG chewable tablet Chew 162 mg by mouth once.   atorvastatin 40 MG tablet Commonly known as:  LIPITOR Take 1 tablet (40 mg total) by mouth daily at 6 PM.   digoxin 0.125 MG tablet Commonly known as:  LANOXIN Take 1 tablet (0.125 mg total) by mouth every Monday, Wednesday, and Friday. Start taking on:  10/23/2018   diltiazem 60 MG tablet Commonly known as:  CARDIZEM Take 1 tablet (60 mg total) by mouth 2 (two) times daily.   dorzolamide 2 % ophthalmic solution Commonly known as:  TRUSOPT Place 1 drop into both eyes 2 (two) times daily.   dorzolamide-timolol 22.3-6.8 MG/ML ophthalmic solution Commonly known as:  COSOPT Place 1 drop into both eyes 2 (two) times daily.   metFORMIN 500 MG tablet Commonly known as:  GLUCOPHAGE Take 1 tablet (500 mg total) by mouth 2 (two) times daily with a meal.   potassium chloride 10 MEQ tablet Commonly known as:  K-DUR,KLOR-CON Take 1 tablet (10 mEq total) by mouth 2 (two) times daily.       Discharge  Instructions: Please refer to Patient Instructions section of EMR for full details.  Patient was counseled important signs and symptoms that should prompt return to medical care, changes in medications, dietary instructions, activity restrictions, and follow up appointments.   Follow-Up Appointments: Future Appointments  Date Time Provider [place]  10/29/2018  1:45 PM Benay Pike, MD [facility]     Zettie Cooley MD 10/22/2018, 3:59 PM PGY-1, [place]

## 2018-10-22 NOTE — Care Management (Signed)
Per Zachery Dakins, W/[facility], Co-pay amount for Eliquis 2.5 mg. a 90 day supply mail order [facility] rx  180 pills for $80.00. Retail pharmacy in [place] and [facility].  co-pay for Eliquis 2.5 mg  30 day supply  60 for 30 is $38.70. NO PA required.  REF# [id number]

## 2018-10-22 NOTE — Consult Note (Signed)
Ref: Patient, No Pcp Per   Subjective:  Feeling better. Converted to sinus rhythm. No right groin pain or hematoma.  Objective:  Vital Signs in the last 24 hours: Temp:  [97.3 F (36.3 C)-98.5 F (36.9 C)] 97.9 F (36.6 C) (11/14 0732) Pulse Rate:  [45-127] 66 (11/14 0732) Cardiac Rhythm: Normal sinus rhythm (11/14 0700) Resp:  [10-26] 19 (11/14 0732) BP: (81-134)/(48-112) 95/61 (11/14 0732) SpO2:  [0 %-100 %] 100 % (11/14 0732) Weight:  [76.9 kg] 76.9 kg (11/14 0423)  Physical Exam: BP Readings from Last 1 Encounters:  10/22/18 95/61     Wt Readings from Last 1 Encounters:  10/22/18 76.9 kg    Weight change: -0.181 kg Body mass index is 31.02 kg/m. HEENT: [place]/AT, Eyes-Brown, PERL, EOMI, Conjunctiva-Pink, Sclera-Non-icteric Neck: No JVD, No bruit, Trachea midline. Lungs:  Clear, Bilateral. Cardiac:  Regular rhythm, normal S1 and S2, no S3. II/VI systolic murmur. Abdomen:  Soft, non-tender. BS present. Extremities:  No edema present. No cyanosis. No clubbing. CNS: AxOx3, Cranial nerves grossly intact, moves all 4 extremities.  Skin: Warm and dry.   Intake/Output from previous day: 11/13 0701 - 11/14 0700 In: 1346.9 [P.O.:480; I.V.:866.9] Out: 0     Lab Results: BMET    Component Value Date/Time   NA 140 10/22/2018 0501   NA 138 10/21/2018 0258   NA 137 10/20/2018 1419   NA 144 01/07/2017 1608   K 3.9 10/22/2018 0501   K 3.7 10/21/2018 0258   K 3.0 (L) 10/20/2018 1419   CL 113 (H) 10/22/2018 0501   CL 110 10/21/2018 0258   CL 102 10/20/2018 1419   CO2 19 (L) 10/22/2018 0501   CO2 23 10/21/2018 0258   CO2 27 10/20/2018 1419   GLUCOSE 132 (H) 10/22/2018 0501   GLUCOSE 138 (H) 10/21/2018 0258   GLUCOSE 166 (H) 10/20/2018 1419   BUN 12 10/22/2018 0501   BUN 13 10/21/2018 0258   BUN 12 10/20/2018 1419   BUN 10 01/07/2017 1608   CREATININE 0.86 10/22/2018 0501   CREATININE 0.94 10/21/2018 0258   CREATININE 0.89 10/20/2018 1419   CREATININE 0.82 06/17/2015  1135   CALCIUM 8.4 (L) 10/22/2018 0501   CALCIUM 8.3 (L) 10/21/2018 0258   CALCIUM 8.9 10/20/2018 1419   GFRNONAA >60 10/22/2018 0501   GFRNONAA 55 (L) 10/21/2018 0258   GFRNONAA 59 (L) 10/20/2018 1419   GFRAA >60 10/22/2018 0501   GFRAA >60 10/21/2018 0258   GFRAA >60 10/20/2018 1419   CBC    Component Value Date/Time   WBC 5.6 10/22/2018 0501   RBC 4.10 10/22/2018 0501   HGB 12.0 10/22/2018 0501   HCT 38.0 10/22/2018 0501   PLT 178 10/22/2018 0501   MCV 92.7 10/22/2018 0501   MCH 29.3 10/22/2018 0501   MCHC 31.6 10/22/2018 0501   RDW 13.9 10/22/2018 0501   LYMPHSABS 2.1 10/20/2018 1419   MONOABS 0.5 10/20/2018 1419   EOSABS 0.2 10/20/2018 1419   BASOSABS 0.0 10/20/2018 1419   HEPATIC Function Panel Recent Labs    10/20/18 1419  PROT 7.1   HEMOGLOBIN A1C No components found for: HGA1C,  MPG CARDIAC ENZYMES Lab Results  Component Value Date   TROPONINI <0.03 10/21/2018   TROPONINI <0.03 10/20/2018   TROPONINI <0.03 10/20/2018   BNP No results for input(s): PROBNP in the last 8760 hours. TSH Recent Labs    10/20/18 1840  TSH 1.442   CHOLESTEROL Recent Labs    10/21/18 0258  CHOL 198  Scheduled Meds: . apixaban  2.5 mg Oral BID  . aspirin  81 mg Oral Once  . atorvastatin  40 mg Oral q1800  . digoxin  0.125 mg Oral Daily  . diltiazem  30 mg Oral QID  . dorzolamide  1 drop Both Eyes BID  . dorzolamide-timolol  1 drop Both Eyes BID  . insulin aspart  0-5 Units Subcutaneous QHS  . insulin aspart  0-9 Units Subcutaneous TID WC  . metFORMIN  250 mg Oral Q breakfast  . potassium chloride  20 mEq Oral BID  . sodium chloride flush  3 mL Intravenous Q12H   Continuous Infusions: . sodium chloride    . heparin 850 Units/hr (10/21/18 2229)   PRN Meds:.sodium chloride, acetaminophen, ondansetron (ZOFRAN) IV, sodium chloride flush  Assessment/Plan: Acute coronary syndrome Moderate to severe distal LAD disease Atrial fibrillation, paroxysmal Type 2  DM H/O hypertension Hyperlipidemia  Start Apixaban. Add small dose metformin Change diltiazem to 60 mg. twice daily. Decrease Lanoxin by 50 %. F/U in 1 week in office Family practice resident notified.   LOS: 2 days    Dixie Dials  MD  10/22/2018, 8:14 AM

## 2018-10-22 NOTE — Care Management Note (Addendum)
Case Management Note  Patient Details  Name: Chelsea Gallegos MRN: [id number] Date of Birth: 1934-08-07  Subjective/Objective: Pt presented for PAF- PTA from home alone. Per patient her daughter checks in with her. Pt states she is not homebound during the day-she works with [employer]. Benefits Check in process for Eliquis.                     Action/Plan: CM did receive a consult in regards to medications and compliance. Patient is not homebound so she will not be able to receive [facility] Services. CM did call MD to see if medications can be sent to the Transitions of Care Pharmacy so medications can be delivered to bedside. CM did ask if Family Medicine can be her PCP- patient is without PCP at this time. CM will continue to monitor for additional needs.   Expected Discharge Date:  10/22/18               Expected Discharge Plan:  Home/Self Care  In-House Referral:  NA  Discharge planning Services  CM Consult, Medication Assistance, Homebound not met per provider  Post Acute Care Choice:  NA Choice offered to:  NA  DME Arranged:  N/A DME Agency:  NA  HH Arranged:  NA HH Agency:  NA  Status of Service:  Completed, signed off  If discussed at [place] of Stay Meetings, dates discussed:    Additional Comments:  Bethena Roys, RN 10/22/2018, 10:25 AM

## 2018-10-22 NOTE — Progress Notes (Addendum)
ANTICOAGULATION CONSULT NOTE - Initial Consult  Pharmacy Consult for ELiquis Indication: Afib / Hx of DVT  No Known Allergies  Patient Measurements: Height: 5\' 2"  (157.5 cm) Weight: 169 lb 9.6 oz (76.9 kg) IBW/kg (Calculated) : 50.1 Heparin Dosing Weight: 67 kg  Vital Signs: Temp: 97.9 F (36.6 C) (11/14 0732) Temp Source: Oral (11/14 0732) BP: 95/61 (11/14 0732) Pulse Rate: 66 (11/14 0732)  Labs: Recent Labs    10/20/18 1419 10/20/18 1840 10/20/18 2230 10/21/18 0258 10/22/18 0501  HGB 14.4  --   --  12.6 12.0  HCT 45.1  --   --  38.4 38.0  PLT 212  --   --  193 178  HEPARINUNFRC  --   --   --  0.33 0.14*  CREATININE 0.89  --   --  0.94 0.86  TROPONINI  --  <0.03 <0.03 <0.03  --     Estimated Creatinine Clearance: 49.2 mL/min (by C-G formula based on SCr of 0.86 mg/dL).   Medical History: Past Medical History:  Diagnosis Date  . Arthritis    "arms" (10/20/2018)  . Asthma   . GERD (gastroesophageal reflux disease)   . History of gout   . History of hiatal hernia   . History of kidney stones   . Hyperlipidemia ~2005  . Hypertension   . Refusal of blood transfusions as patient is Jehovah's Witness   . Stroke [facility]) 04/2017   "dr told me I had a stroke in my left eye" (10/20/2018)  . Type II diabetes mellitus (HCC)     Medications:  Scheduled:  . apixaban  2.5 mg Oral BID  . aspirin  81 mg Oral Once  . digoxin  0.125 mg Oral Daily  . diltiazem  30 mg Oral QID  . dorzolamide  1 drop Both Eyes BID  . dorzolamide-timolol  1 drop Both Eyes BID  . insulin aspart  0-5 Units Subcutaneous QHS  . insulin aspart  0-9 Units Subcutaneous TID WC  . potassium chloride  20 mEq Oral BID  . sodium chloride flush  3 mL Intravenous Q12H    Assessment: 9 yof presenting with atrial fibrillation and exertional recurrent chest pain. On heparin gtt for ACS/Afib. No anticoag PTA; CHADs-VASc score 5. Last heparin level was low this morning at 0.14.  Plan to transition to  low dose Eliquis per Cards this morning.  Goal of Therapy:  Heparin level 0.3-0.7 units/ml Monitor platelets by anticoagulation protocol: Yes   Plan:  Start Eliquis 5mg  PO BID Stop heparin gtt when first dose given Monitor CBC, s/s of bleed  Elenor Quinones, PharmD, BCPS Clinical Pharmacist Phone number [phone] 10/22/2018 8:06 AM

## 2018-10-22 NOTE — Telephone Encounter (Signed)
Patient has been discharged, we were mistaken in thinking that all of the patient's meds were going to be delivered to her room via [place]. They received metformin and eliquis. Spoke with daugther over the phone, will send lipitor, dig, dilt, and K to [address].  Ralene Ok, MD

## 2018-10-22 NOTE — Progress Notes (Addendum)
Family Medicine Teaching Service Daily Progress Note Intern Pager: [phone]  Patient name: Chelsea Gallegos Medical record number: [id number] Date of birth: [birth year] Age: 84 y.o. Gender: female  Primary Care Provider: Patient, No Pcp Per Consultants: Cardiology Code Status: Limited code  Pt Overview and Major Events to Date:  Admitted: 10/20/2018  Hospital Day: 3  Assessment and Plan: Chelsea Gallegos a 84 y.o.femalepresenting with intermittent chest pain worse with activity x 1 week. PMH is significant for Afib, HTN, DMII, HLD GERD which are not treated with any medications as she has not seen a doctor in three years.  #Stable Angina ACS vs tamponade vs GERD/esophageal dysmotility, Initial EKG significant for minor ST elevations in inferior leads. AM EKG today afib w/ RVR, t wave abnormalities.  Trops negative x 3. BNP 337.1. LDL 123. BMP and CBC wnl. Cath yesterday with 70% stenosis of dist LAD, medical therapy with ASA 81. Echo - 65-70% EF. G1DD.   Transfer to tele  Out of bed with assistance, fall risk  Final Cardio recs 10/22/18 ? Start apixaban ? Add small dose metformin ? Change diltz to 60mg  BID ? Digoxin 0.06 daily  ? F/u with cardiology outpatient in one week   PT/OT today  Continue ASA  Heparin per pharm consult (unfractionated level 0.14)  Possible DC today  #Atrial fibrillation w/ RVR Normal sinus at 70 BP. Patient will likely need help obtaining medications outpatient   Digoxin 0.125mg  daily PO, Diltiazem 30mg  QID, Continue ASA 81  Daily AM EKG's   Discuss long term anticoag- heparin per pharm consult for now  Consult Case management for medicaiton   Patient will need PCP  #Hypotension 95/61 and has continued to be in this range. A fib medically cardioverted. Cause   D/c'ed metop (indicated for afib)   Hold all antihypertensives  #DMII A1c 6.6. Patient is not currently on any out patient medication. CBGs stable.  Will  need outpatient follow up   Continue to follow CBGs, sSSI   #HTN Patient is currently hypotensive.  Continue to monitor blood pressures  #HLD LDL 123, otherwise wnl. Patient is 55 but given risk factors and hx, would benefit from low intensity statin.  Start low intensity statin?   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   Contact ophthalmologist for recent medication changes.  E: Daily KDUR N:PO Heart Healthy GIppx:none DVTppx:Heparin Per Pharmacy  Future Labs:AM BMP Disposition:Telemetry  Medications: Scheduled Meds: . aspirin  81 mg Oral Once  . digoxin  0.125 mg Oral Daily  . diltiazem  30 mg Oral QID  . dorzolamide  1 drop Both Eyes BID  . dorzolamide-timolol  1 drop Both Eyes BID  . insulin aspart  0-5 Units Subcutaneous QHS  . insulin aspart  0-9 Units Subcutaneous TID WC  . potassium chloride  20 mEq Oral BID  . sodium chloride flush  3 mL Intravenous Q12H   Continuous Infusions: . sodium chloride    . heparin 850 Units/hr (10/21/18 2229)   PRN Meds: sodium chloride, acetaminophen, ondansetron (ZOFRAN) IV, sodium chloride flush  Subjective  Patient reports doing well this morning. She is in good spirits.   Objective:   Vital Signs Temp:  [97.3 F (36.3 C)-98.5 F (36.9 C)] 97.9 F (36.6 C) (11/14 0732) Pulse Rate:  [45-127] 66 (11/14 0732) Resp:  [10-26] 19 (11/14 0732) BP: (81-134)/(48-112) 95/61 (11/14 0732)  SpO2:  [0 %-100 %] 100 % (11/14 0732) Weight:  [76.9 kg] 76.9 kg (11/14 0423) Filed Weights   10/20/18 1527 10/21/18 0607 10/22/18 0423  Weight: 77.1 kg 77 kg 76.9 kg   Physical Exam:  Gen: NAD, alert, non-toxic, well-appearing, sitting comfortably  Skin: Warm and dry HEENT: NCAT.  MMM.  CV: RRR.  Normal S1-S2. No BLEE. Resp: CTAB. No increased WOB Abd: NTND on palpation  to all 4 quadrants.  Pos bowel sounds Extremities: moves extremities spontaneously. Warm and well perfused.   Laboratory: Recent Labs  Lab 10/20/18 1419 10/21/18 0258 10/22/18 0501  WBC 5.8 6.6 5.6  HGB 14.4 12.6 12.0  HCT 45.1 38.4 38.0  PLT 212 193 178   Recent Labs  Lab 10/20/18 1419 10/21/18 0258 10/22/18 0501  NA 137 138 140  K 3.0* 3.7 3.9  CL 102 110 113*  CO2 27 23 19*  BUN 12 13 12   CREATININE 0.89 0.94 0.86  CALCIUM 8.9 8.3* 8.4*  PROT 7.1  --   --   BILITOT 0.7  --   --   ALKPHOS 83  --   --   ALT 18  --   --   AST 16  --   --   GLUCOSE 166* 138* 132*    Imaging/Diagnostic Tests: Dg Chest Portable 1 View  Result Date: 10/20/2018 CLINICAL DATA:  Intermittent chest pain for a week, dizziness EXAM: PORTABLE CHEST 1 VIEW COMPARISON:  Chest x-ray 05/20/2015 FINDINGS: No pneumonia or effusion is seen. Probable mild chronic fibrotic change is present at the bases. Mediastinal and hilar contours are unremarkable and mild cardiomegaly is stable. No bony abnormality is seen. IMPRESSION: No active disease. Electronically Signed   By: Ivar Drape M.D.   On: 10/20/2018 15:04     Wilber Oliphant, MD 10/22/2018, 7:48 AM PGY-1, [place] Intern pager: [phone], text pages welcome

## 2018-10-23 MED FILL — Verapamil HCl IV Soln 2.5 MG/ML: INTRAVENOUS | Qty: 2 | Status: AC

## 2018-10-29 ENCOUNTER — Ambulatory Visit (INDEPENDENT_AMBULATORY_CARE_PROVIDER_SITE_OTHER): Payer: Medicare Other | Admitting: Family Medicine

## 2018-10-29 VITALS — BP 97/70 | HR 70 | Temp 97.8°F | Ht 62.5 in | Wt 167.0 lb

## 2018-10-29 DIAGNOSIS — I4891 Unspecified atrial fibrillation: Secondary | ICD-10-CM | POA: Diagnosis not present

## 2018-10-29 DIAGNOSIS — E876 Hypokalemia: Secondary | ICD-10-CM | POA: Insufficient documentation

## 2018-10-29 DIAGNOSIS — E119 Type 2 diabetes mellitus without complications: Secondary | ICD-10-CM | POA: Diagnosis not present

## 2018-10-29 DIAGNOSIS — E785 Hyperlipidemia, unspecified: Secondary | ICD-10-CM

## 2018-10-29 NOTE — Assessment & Plan Note (Signed)
Patient reports taking her metformin without any difficulty.  She was diagnosed years ago with diabetes but lost 80 lbs and hasn't been taking medicine for it in years.  Will continue current metformin dosage and check A1c again in 3 months.  Current A1c is 6.6%

## 2018-10-29 NOTE — Progress Notes (Signed)
   [facility] Phone: [phone]   cc: hospital followup for chest pain, tachycardia  Subjective:  Patient has 'not really' had any chest pain since hospital discharge.  Sometimes her heart beats 'funny' but returns to normal after she takes a deep breath. She has had No dizziness,  No dyspnea on exertion, no weakness in any of her extremities. Takes all her medications.  Goes to cardiologist on December 5tth w/ Dr. Doylene Canard.    Diabetes: taking her metformin.  Was about 80 lbs. Heavier when she was diagnosed first with diabetes.   No GI side effects so far.   Hyperlipidemia: no muscle pain.  Taking her medication.    Hypokalemia:  Takes a banana with her medication.    Patient Takes SSS tonic and [place] Bitters every few days. She has been doing this for 20 years she said.  She doesn't measure how much she takes, she just 'throws it back'.     Got flu shot at [facility] in august.    ROS: See HPI for pertinent positives and negatives  Past Medical History  Family history reviewed for today's visit. No changes.  Social history- patient is a former smoker  Objective: BP 97/70 (BP Location: Right Arm, Patient Position: Sitting, Cuff Size: Normal)   Pulse 70   Temp 97.8 F (36.6 C) (Oral)   Ht 5' 2.5" (1.588 m)   Wt 167 lb (75.8 kg)   SpO2 96%   BMI 30.06 kg/m  Gen: NAD, alert and oriented, cooperative with exam HEENT: NCAT, EOMI, MMM CV: normal rate, regular rhythm. No murmurs, no rubs.  Resp: LCTAB, no wheezes, crackles. normal work of breathing GI: nontender to palpation, BS present, no guarding or organomegaly Msk: No edema, warm, normal tone, 5/5 strength upper and lower extremities bilaterally.  moves UE/LE spontaneously Psych: Appropriate behavior.  Patient smiling and in a good mood.   Assessment/Plan: Diabetes type 2, controlled ([place]) Patient reports taking her metformin without any difficulty.  She was diagnosed years ago with diabetes but lost  80 lbs and hasn't been taking medicine for it in years.  Will continue current metformin dosage and check A1c again in 3 months.  Current A1c is 6.6%  Atrial fibrillation with rapid ventricular response ([place]) Patient reports no chest pain since discharge with occasional transient arrythmias.  She is compliant with all her medications.  Will continue with digoxin, diltiazem, and eloquis.  Patient has cardiology appointment on the 5th of December.  HR regular on exam today.   Hyperlipidemia Patient is taking her lipitor.  Reports no side effects.  Will continue current prescription.  Will obtain LFTs on next visit.     Hypokalemia Patient taking her K-Dur as prescribed with a banana. Potassium on discharge was 3.9.  Obtained a BMP today to check potassium levels.      Clemetine Marker, MD PGY-1

## 2018-10-29 NOTE — Patient Instructions (Signed)
It was nice to meet  You today Ms. Lyn,    I am excited to be your new primary care doctor.  You are doing a great job of taking your medications.  Please continue to take them as prescribed.    I think it's okay for you to continue to take your tonics but I wouldn't take the SSS tonic any more than every 3 days because of how much iron it has in it.    I will let you know the results of your lab tests when I get them.    I would like you to come back for a regular physical and to establish care sometime in the next month.  Please schedule an appointment on the way out.   Have a great Day,   Clemetine Marker, MD

## 2018-10-29 NOTE — Assessment & Plan Note (Signed)
Patient is taking her lipitor.  Reports no side effects.  Will continue current prescription.  Will obtain LFTs on next visit.

## 2018-10-29 NOTE — Assessment & Plan Note (Signed)
Patient reports no chest pain since discharge with occasional transient arrythmias.  She is compliant with all her medications.  Will continue with digoxin, diltiazem, and eloquis.  Patient has cardiology appointment on the 5th of December.  HR regular on exam today.

## 2018-10-29 NOTE — Assessment & Plan Note (Addendum)
Patient taking her K-Dur as prescribed with a banana. Potassium on discharge was 3.9.  Obtained a BMP today to check potassium levels.

## 2018-10-30 LAB — BASIC METABOLIC PANEL
BUN / CREAT RATIO: 21 (ref 12–28)
BUN: 18 mg/dL (ref 8–27)
CO2: 20 mmol/L (ref 20–29)
Calcium: 9.3 mg/dL (ref 8.7–10.3)
Chloride: 104 mmol/L (ref 96–106)
Creatinine, Ser: 0.87 mg/dL (ref 0.57–1.00)
GFR, EST AFRICAN AMERICAN: 72 mL/min/{1.73_m2} (ref >59)
GFR, EST NON AFRICAN AMERICAN: 63 mL/min/{1.73_m2} (ref >59)
Glucose: 83 mg/dL (ref 65–99)
Potassium: 5.1 mmol/L (ref 3.5–5.2)
SODIUM: 139 mmol/L (ref 134–144)

## 2018-12-14 ENCOUNTER — Other Ambulatory Visit: Payer: Self-pay

## 2018-12-14 ENCOUNTER — Encounter: Payer: Self-pay | Admitting: Family Medicine

## 2018-12-14 ENCOUNTER — Ambulatory Visit: Payer: Medicare Other | Admitting: Family Medicine

## 2018-12-14 VITALS — BP 110/62 | HR 84 | Temp 98.5°F | Ht 62.0 in | Wt 169.0 lb

## 2018-12-14 DIAGNOSIS — E119 Type 2 diabetes mellitus without complications: Secondary | ICD-10-CM | POA: Diagnosis not present

## 2018-12-14 DIAGNOSIS — E785 Hyperlipidemia, unspecified: Secondary | ICD-10-CM

## 2018-12-14 DIAGNOSIS — I4891 Unspecified atrial fibrillation: Secondary | ICD-10-CM | POA: Diagnosis not present

## 2018-12-14 DIAGNOSIS — Z23 Encounter for immunization: Secondary | ICD-10-CM

## 2018-12-14 DIAGNOSIS — Z0000 Encounter for general adult medical examination without abnormal findings: Secondary | ICD-10-CM

## 2018-12-14 NOTE — Progress Notes (Deleted)
Subjective:   Chief Complaint  Patient presents with  . f/u afib   HPI Chelsea Gallegos is a 85 y.o. old female here  for annual exam.  Concern today: patient has no concerns today.  She saw the cariologist on the fifth and there were no changes in medications, according to the patient.  She sees a doctor for cataracts and glaucoma, but has not had a diabetic eye exam as far as she can remember.  She is having No chest pain, no abdominal pain, no heart palpitations.   Changes in his/her health in the last 12 months: no Occupation:  Works about 1.5 hours a week with someone she has known for 40 + years.    Wears seatbelt: yes.    The patient has regular exercise: no.  She has no barriers to exercise, she just does not exercise.  Enough vegetables and fruits: patient eats mostly meat. She eats out a lot, at [employer].  At home she makes shrimp.  She doesn't eat much fruit or vegetables. She Only likes spinach, turnip greens. .  Smokes cigarette: no.  Stopped in 1974.   Drinks EtOH: yes.  Every Friday she goes out with a friend and has a drink.  Drinks [place] ice tea.  Drinks swed[place] bitters and the SS tonic every three days.  She doesn't get 'tore up from the floor up', as she put it.     Drug use: no Patient takes ASA: yes.  Patient takes vitD & Ca: no. The patient is not sexually active.  She says she's 'too old' for that.  Falls: kicked herself out of the bed 3-4 months ago and hurt her shoulder.  Still hurts sometimes.  Otherwise she has no history of falls.   Domestic violence: no. Lives alone.  History of depression:no.  Patient dental home: yes. Has appointment on Dec 6.  Has partials.     Immunizations  Needs influenza vaccine: no.  Needs HPV (Women until age 32): no.  Needs Shingrix (all >42yrs of age): no.  Needs Tdap: no.  Needs Pneumococcal: yes. Patient is unsure if she had PNA vaccine but based on her story, it is unlikely. .   1. 25 to 83 years of age    -Intermediate risk groups (smokers; chronic heart, lung and liver  disease, DM & alcoholism) PPSV23 alone:  (Grade 1B).   -High risk groups (asplenia, immunocompromised [HIV, CA], CSF leak, cochlear implant, advanced kidney dis)-PCV13, then PPSV23 after 8 wks. (Grade 1B). PCV13 after 58yr If already had PPSV23.  2.   Age ? 65: PCV13 followed by PPSV23 6 to 12 months later. PCV13 after 90yr If already had PPSV23.  Screening Need colon cancer screening: no. Need breast cancer ccreening: no. Need cervical cancer Screening: no. Need AAA screening (men 65-74, >100 cigarettes):no At risk for skin cancer: no. Need HCV Screening: no. Need STI Screening: no. Fall in the last 12 months:no  PMH/Problem List: has Hypertension; Hyperlipidemia; Diabetes type 2, controlled ([place]); Chest pain; Right ankle pain; Acute gout of right ankle; Paroxysmal atrial fibrillation ([place]); Atrial fibrillation with rapid ventricular response ([place]); GERD (gastroesophageal reflux disease); Hypotension; and Hypokalemia on their problem list.   has a past medical history of Arthritis, Asthma, GERD (gastroesophageal reflux disease), History of gout, History of hiatal hernia, History of kidney stones, Hyperlipidemia (~2005), Hypertension, Refusal of blood transfusions as patient is Jehovah's Witness, Stroke ([place]) (04/2017), and Type II diabetes mellitus ([place]).  [facility]  Family History  Problem Relation Age of Onset  . Hypertension Mother   . Hypertension Father   . Breast cancer Daughter     SH Social History   Tobacco Use  . Smoking status: Former Smoker    Packs/day: 3.00    Years: 20.00    Pack years: 60.00    Types: Cigarettes    Last attempt to quit: 12/09/1972    Years since quitting: 46.0  . Smokeless tobacco: Never Used  Substance Use Topics  . Alcohol use: Yes    Alcohol/week: 1.0 standard drinks    Types: 1 Standard drinks or equivalent per week  . Drug use: Never     Review of Systems       Objective:   Physical Exam Vitals:   12/14/18 0851  Weight: 169 lb (76.7 kg)  Height: 5\' 2"  (1.575 m)   Body mass index is 30.91 kg/m.  GEN: appears well & comfortable. No apparent distress. Head: normocephalic and atraumatic  Eyes: conjunctiva without injection. No scleral icterus.  PERRL.  EOMI Nares: no rhinorrhea.  Oropharynx: MMM. No erythema.   Uvula midline HEM: negative for cervical or periauricular lymphadenopathies CVS: regular rhythm. Normal rate.  2+ pulses bilaterally. No edema.   RESP: no IWOB, good air movement bilaterally, CTAB GI: BS present & normal, soft, NTND, no guarding, no rebound, no palpable mass MSK: no focal tenderness or notable swelling SKIN: no apparent skin lesion  NEURO: alert and oiented appropriately, no gross deficits   PSYCH: euthymic mood with congruent affect.  Very pleasant.  Smiling and laughing.      Assessment & Plan:  There are no diagnoses linked to this encounter.    Addison Naegeli PGY-1 12/14/18  8:54 AM

## 2018-12-14 NOTE — Assessment & Plan Note (Signed)
Patient has no complaints of chest pain, heart palpitations, syncopal episodes.  She has been taking her eliquis, digoxin, and diltiazem as prescribed.  She had an appointment with her cardiologist in December.  We are currently getting her records from that visit.  No changes in current medications.

## 2018-12-14 NOTE — Patient Instructions (Addendum)
It was nice to see you again,   You sound like you are doing fine since the last time we spoke. We did some tests for diabetes today.  I will let you know the results of the lab tests.  I have also put in an order for a bone scan.  I have sent a referral to a diabetic eye exam.  Someone will call you to set up the appointment.    Please try to add more vegetables and fruits to your diet. Try to eat at least one vegetable with two of your meals each day.   Also try to exercise more.  I'd like for you to try walking for 30 minutes 3 times a week at least to start.   If you have any new problems to discuss, make an appointment, otherwise I won't need to see you again for another 6 months.    Have a great day,   Clemetine Marker, MD

## 2018-12-14 NOTE — Assessment & Plan Note (Signed)
Patient has never had dexa bone scan as far as she can remember.  She has no recent history of falls or fractures. Ordered a dexa scan.  Also gave patient a PNA vaccination.    Advised patient on the importance of eating a balanced diet with fruits and vegetables.  Told her to try for having vegetables with at least two meals a day.    Advised patient to try to increase her exercise and start with 30 minutes of walking 3 times a week and increase from there.

## 2018-12-14 NOTE — Progress Notes (Addendum)
[facility] Phone: [phone]   cc: establish care, afib  Subjective:  HPI  Chelsea Gallegos is a 85 y.o. old female here for annual exam.  Concern today: patient has no concerns today. She saw the cariologist on the fifth and there were no changes in medications, according to the patient. She sees a doctor for cataracts and glaucoma, but has not had a diabetic eye exam as far as she can remember. She is having No chest pain, no abdominal pain, no heart palpitations.   Occupation: Works about 1.5 hours a week with someone she has known for 40 + years.  Wears seatbelt: yes.   The patient has regular exercise: no. She has no barriers to exercise, she just does not exercise.   Enough vegetables and fruits: patient eats mostly meat. She eats out a lot, at [employer]. At home she makes shrimp. She doesn't eat much fruit or vegetables. She Only likes spinach, turnip greens. .   Smokes cigarette: no. Stopped in 1974 .  Drinks EtOH: yes. Every Friday she goes out with a friend and has a drink. Drinks [place] ice tea. Drinks swedish bitters and the SS tonic every three days. She doesn't get 'tore up from the floor up', as she put it.   Drug use: no   Patient takes ASA: yes.   Patient takes vitD & Ca: no.   The patient is not sexually active. She says she's 'too old' for that.   Falls: kicked herself out of the bed 3-4 months ago and hurt her shoulder. Still hurts sometimes. Otherwise she has no history of falls.   Domestic violence: no. Lives alone.   History of depression:no.   Patient dental home: yes. Has appointment on Dec 6. Has partials.   Immunizations  Needs influenza vaccine: no.  Needs HPV (Women until age 98): no.  Needs Shingrix (all >29yrs of age): no.  Needs Tdap: no.  Needs Pneumococcal: yes. Patient is unsure if she had PNA vaccine but based on her story, it is unlikely. .  1. 78 to 83 years of age  -Intermediate risk groups (smokers;  chronic heart, lung and liver disease, DM & alcoholism) PPSV23 alone: (Grade 1B).  -High risk groups (asplenia, immunocompromised [HIV, CA], CSF leak, cochlear implant, advanced kidney dis)-PCV13, then PPSV23 after 8 wks. (Grade 1B). PCV13 after 24yr If already had PPSV23.  2. Age ? 65: PCV13 followed by PPSV23 6 to 12 months later. PCV13 after 80yr If already had PPSV23.   Screening  Need colon cancer screening: no.  Need breast cancer ccreening: no.  Need cervical cancer Screening: no.  Need AAA screening (men 65-74, >100 cigarettes):no  At risk for skin cancer: no.  Need HCV Screening: no.  Need STI Screening: no.  Fall in the last 12 months:no   ROS: See HPI for pertinent positives and negatives  Past Medical History  Family history reviewed for today's visit. No changes.  Social history- patient is a non smoker  Objective: BP 110/62   Pulse 84   Temp 98.5 F (36.9 C) (Oral)   Ht 5\' 2"  (1.575 m)   Wt 169 lb (76.7 kg)   SpO2 98%   BMI 30.91 kg/m  GEN: appears well & comfortable. No apparent distress. Head: normocephalic and atraumatic  Eyes: conjunctiva without injection. No scleral icterus.  PERRL.  EOMI Nares: no rhinorrhea.  Oropharynx: MMM. No erythema.   Uvula midline HEM: negative for cervical or periauricular lymphadenopathies CVS:  regular rhythm. Normal rate.  2+ pulses bilaterally. No edema.   RESP: no IWOB, good air movement bilaterally, CTAB GI: BS present & normal, soft, NTND, no guarding, no rebound, no palpable mass MSK: no focal tenderness or notable swelling SKIN: no apparent skin lesion  NEURO: alert and oiented appropriately, no gross deficits  PSYCH: euthymic mood with congruent affect.  Very pleasant.  Smiling and laughing.    Assessment/Plan: Atrial fibrillation with rapid ventricular response (Chelsea Gallegos) Patient has no complaints of chest pain, heart palpitations, syncopal episodes.  She has been taking her eliquis, digoxin, and diltiazem as  prescribed.  She had an appointment with her cardiologist in December.  We are currently getting her records from that visit.  No changes in current medications.    Diabetes type 2, controlled (Chelsea Gallegos) Patient is taking her metformin as prescribed.  No GI side effects.  Her diet consists of mostly meats with few vegetables and fruits.  performed diabetic foot exam, which was normal, and referred to ophthalmologist for retinopathy exam. Urine microalbumin ordered.    Hyperlipidemia Patient has been taking lipitor 40mg  daily.  She is not complaining of muscle pains or weakness.  CMP was ordered to test LFTs.    Healthcare maintenance Patient has never had dexa bone scan as far as she can remember.  She has no recent history of falls or fractures. Ordered a dexa scan.  Also gave patient a PNA vaccination.    Advised patient on the importance of eating a balanced diet with fruits and vegetables.  Told her to try for having vegetables with at least two meals a day.    Advised patient to try to increase her exercise and start with 30 minutes of walking 3 times a week and increase from there.      Clemetine Marker, MD PGY-1

## 2018-12-14 NOTE — Assessment & Plan Note (Signed)
Patient has been taking lipitor 40mg  daily.  She is not complaining of muscle pains or weakness.  CMP was ordered to test LFTs.

## 2018-12-14 NOTE — Assessment & Plan Note (Signed)
Patient is taking her metformin as prescribed.  No GI side effects.  Her diet consists of mostly meats with few vegetables and fruits.  performed diabetic foot exam, which was normal, and referred to ophthalmologist for retinopathy exam. Urine microalbumin ordered.

## 2018-12-15 ENCOUNTER — Encounter: Payer: Self-pay | Admitting: Family Medicine

## 2018-12-15 LAB — COMPREHENSIVE METABOLIC PANEL
A/G RATIO: 1.3 (ref 1.2–2.2)
ALT: 19 IU/L (ref 0–32)
AST: 17 IU/L (ref 0–40)
Albumin: 3.9 g/dL (ref 3.5–4.7)
Alkaline Phosphatase: 102 IU/L (ref 39–117)
BUN/Creatinine Ratio: 11 — ABNORMAL LOW (ref 12–28)
BUN: 10 mg/dL (ref 8–27)
Bilirubin Total: 0.4 mg/dL (ref 0.0–1.2)
CALCIUM: 9.1 mg/dL (ref 8.7–10.3)
CO2: 22 mmol/L (ref 20–29)
Chloride: 107 mmol/L — ABNORMAL HIGH (ref 96–106)
Creatinine, Ser: 0.88 mg/dL (ref 0.57–1.00)
GFR calc Af Amer: 71 mL/min/{1.73_m2} (ref >59)
GFR, EST NON AFRICAN AMERICAN: 61 mL/min/{1.73_m2} (ref >59)
GLUCOSE: 100 mg/dL — AB (ref 65–99)
Globulin, Total: 3 g/dL (ref 1.5–4.5)
Potassium: 3.9 mmol/L (ref 3.5–5.2)
SODIUM: 143 mmol/L (ref 134–144)
Total Protein: 6.9 g/dL (ref 6.0–8.5)

## 2018-12-15 LAB — MICROALBUMIN, URINE

## 2019-02-19 NOTE — Progress Notes (Signed)
[name]  02/22/2019     CHIEF COMPLAINT Patient presents for Retina Evaluation and Diabetic Eye Exam   HISTORY OF PRESENT ILLNESS: Chelsea Gallegos is a 85 y.o. female who presents to the clinic today for:   HPI    Retina Evaluation    In both eyes.  Associated Symptoms Negative for Flashes, Pain, Floaters, Redness, Photophobia, Distortion, Blind Spot, Glare, Shoulder/Hip pain, Fatigue, Jaw Claudication, Weight Loss, Scalp Tenderness, Fever and Trauma.  Context:  distance vision, mid-range vision and near vision.  Treatments tried include eye drops and surgery.  Response to treatment was no improvement.  I, the attending physician,  performed the HPI with the patient and updated documentation appropriately.          Diabetic Eye Exam    Vision is stable.  Associated Symptoms Photophobia.  Negative for Flashes, Pain, Trauma, Fever, Weight Loss, Scalp Tenderness, Redness, Floaters, Distortion, Jaw Claudication, Fatigue, Shoulder/Hip pain, Glare and Blind Spot.  Diabetes characteristics include Type 2.  This started 10 years ago.  Blood sugar level is controlled.  I, the attending physician,  performed the HPI with the patient and updated documentation appropriately.          Comments    Referral of Dr. [name] for CME . Patient states after having  Cataract sx ou 2018, she is experiencing   blurred vision and light sensitivity OU. Denies flashes, and ocular pain. Pt is DM2 x 10 yrs ago , Pt does not monitor BS or A1C , patient Bs controlled by diet ,denies hypo/hyperglycemic episodes. Pt states she is using Dorzolamide, Prednisolone  Gtt's Bid OU and she is only taking aspirin QD       Last edited by [name], MD on 02/22/2019  9:43 AM. (History)    pt states her right eye has gotten more blurry recently, she states she can barely read, she states her left eye has always been worse than right, she states [name] did cataract and glaucoma sx  on the left eye, but she is no longer seeind [name] bc she felt like her vision was not getting any better, she is using cosopt and PF OU BID, pt has been seeing Dr. [name] for about 6 months and feels like her vision has gotten better since seeing him, pt states she has seen [name] and [name] about 7-8 years ago, she says [name] was giving her injections in her eyes, but she is not sure why  Referring physician: [name], MD [place], [place] [address]  HISTORICAL INFORMATION:   Selected notes from the MEDICAL RECORD NUMBER Referred by Dr. [name] for concern of worsening CME OD LEE: 03.05.20 Read Drivers) [BCVA: OD: 20/40 OS: 20/100] Ocular Hx-POAG, pseudo OU, CME OU, BRVO OU, follicular conjunctivitis OU, iritis OU, NPDR OU, scar on posterior pole OS PMH-DM (A1c: 6.6, takes metformin), HLD, asthma    CURRENT MEDICATIONS: Current Outpatient Medications (Ophthalmic Drugs)  Medication Sig  . dorzolamide (TRUSOPT) 2 % ophthalmic solution Place 1 drop into both eyes 2 (two) times daily.  . dorzolamide-timolol (COSOPT) 22.3-6.8 MG/ML ophthalmic solution Place 1 drop into both eyes 2 (two) times daily.   No current facility-administered medications for this visit.  (Ophthalmic Drugs)   Current Outpatient Medications (Other)  Medication Sig  . aspirin 81 MG chewable tablet Chew 162 mg by mouth once.  Marland Kitchen apixaban (ELIQUIS) 5 MG TABS tablet  Take 1 tablet (5 mg total) by mouth 2 (two) times daily. (Patient not taking: Reported on 02/22/2019)  . atorvastatin (LIPITOR) 40 MG tablet Take 1 tablet (40 mg total) by mouth daily at 6 PM. (Patient not taking: Reported on 02/22/2019)  . digoxin (LANOXIN) 0.125 MG tablet Take 1 tablet (0.125 mg total) by mouth every Monday, Wednesday, and Friday. (Patient not taking: Reported on 02/22/2019)  . diltiazem (CARDIZEM) 60 MG tablet Take 1 tablet (60 mg total) by mouth 2 (two) times daily. (Patient not taking:  Reported on 02/22/2019)  . metFORMIN (GLUCOPHAGE) 500 MG tablet Take 1 tablet (500 mg total) by mouth 2 (two) times daily with a meal. (Patient not taking: Reported on 02/22/2019)  . potassium chloride (K-DUR,KLOR-CON) 10 MEQ tablet Take 1 tablet (10 mEq total) by mouth 2 (two) times daily. (Patient not taking: Reported on 02/22/2019)   No current facility-administered medications for this visit.  (Other)      REVIEW OF SYSTEMS: ROS    Positive for: Endocrine, Eyes   Negative for: Constitutional, Gastrointestinal, Neurological, Skin, Genitourinary, Musculoskeletal, HENT, Cardiovascular, Respiratory, Psychiatric, Allergic/Imm, Heme/Lymph   Last edited by [name], LPN on 3/66/4403  4:74 AM. (History)       ALLERGIES No Known Allergies  PAST MEDICAL HISTORY Past Medical History:  Diagnosis Date  . Arthritis    "arms" (10/20/2018)  . Asthma   . GERD (gastroesophageal reflux disease)   . History of gout   . History of hiatal hernia   . History of kidney stones   . Hyperlipidemia ~2005  . Hypertension   . Refusal of blood transfusions as patient is Jehovah's Witness   . Stroke [facility]) 04/2017   "dr told me I had a stroke in my left eye" (10/20/2018)  . Type II diabetes mellitus ([place])    Past Surgical History:  Procedure Laterality Date  . CATARACT EXTRACTION    . CATARACT EXTRACTION, BILATERAL Bilateral 2018  . CHOLECYSTECTOMY     pt does not recall this OR (10/20/2018)  . CYSTOSCOPY W/ STONE MANIPULATION    . EXCISIONAL HEMORRHOIDECTOMY  1980s/1990s   "& he cut my muscle that controls my bowels"  . EYE SURGERY    . FRACTURE SURGERY    . GLAUCOMA SURGERY Bilateral 2018  . HERNIA REPAIR    . KNEE ARTHROSCOPY Left 04/2012    WITH MEDIAL MENISECTOMY/notes 04/16/2012  . LEFT HEART CATH AND CORONARY ANGIOGRAPHY N/A 10/21/2018   Procedure: LEFT HEART CATH AND CORONARY ANGIOGRAPHY;  Surgeon: Dixie Dials, MD;  Location: [facility];  Service: Cardiovascular;   Laterality: N/A;  . PATELLA FRACTURE SURGERY Right ~ 2006  . UMBILICAL HERNIA REPAIR  2000s    FAMILY HISTORY Family History  Problem Relation Age of Onset  . Hypertension Mother   . Hypertension Father   . Breast cancer Daughter     SOCIAL HISTORY Social History   Tobacco Use  . Smoking status: Former Smoker    Packs/day: 3.00    Years: 20.00    Pack years: 60.00    Types: Cigarettes    Last attempt to quit: 12/09/1972    Years since quitting: 46.2  . Smokeless tobacco: Never Used  Substance Use Topics  . Alcohol use: Yes    Alcohol/week: 1.0 standard drinks    Types: 1 Standard drinks or equivalent per week  . Drug use: Never         OPHTHALMIC EXAM:  Base Eye Exam    Visual  Acuity (Snellen - Linear)      Right Left   Dist [place] 20/40 20/100 -2   Dist ph [place] NI 20/100       Tonometry (Tonopen, 9:16 AM)      Right Left   Pressure 18 17       Pupils      Dark Light Shape React APD   Right 4 3 Round Slow None   Left 4 3 Round Slow None       Visual Fields (Counting fingers)      Left Right     Full   Restrictions Partial outer superior temporal, inferior temporal, superior nasal, inferior nasal deficiencies        Extraocular Movement      Right Left    Full, Ortho Full, Ortho       Neuro/Psych    Oriented x3:  Yes       Dilation    Both eyes:  1.0% Mydriacyl, 2.5% Phenylephrine @ 9:16 AM        Slit Lamp and Fundus Exam    Slit Lamp Exam      Right Left   Lids/Lashes Dermatochalasis - upper lid Dermatochalasis - upper lid   Conjunctiva/Sclera mild Melanosis mild Melanosis   Cornea Arcus, 1-2+ Punctate epithelial erosions, Well healed temporal cataract wounds Arcus, 2+ Punctate epithelial erosions, Well healed cataract wounds   Anterior Chamber deep and clear, cypass device at 0300 deep and clear, cypass device at 0800   Iris Round and dilated, No NVI Round and dilated, No NVI   Lens PC IOL in good position PC IOL in good position    Vitreous Vitreous syneresis Vitreous syneresis       Fundus Exam      Right Left   Disc +cupping, 3+pallor, +heme at 0700, temporal PPP +cupping, 3-4+pallor, non-existent inferior rim, temporal PPP   C/D Ratio 0.8 0.95   Macula blunted foveal reflex, central CME Blunted foveal reflex, Cystic changes   Vessels Attenuated, Tortuous, venules dilated, AV crossing changes Attenuated, venules Dilated and Tortuous, scleratic vessels temporal periphery   Periphery attached, scattered RPE changes, mild IRH/DBH inferior hemishphere Attached, scattered RPE changes, sectoral PRP scars superiorly, sclerotiic vessels temporal periphery          IMAGING AND PROCEDURES  Imaging and Procedures for @TODAY @  OCT, Retina - OU - Both Eyes       Right Eye Quality was good. Central Foveal Thickness: 615. Progression has no prior data. Findings include abnormal foveal contour, subretinal fluid, intraretinal fluid.   Left Eye Quality was good. Central Foveal Thickness: 291. Progression has no prior data. Findings include normal foveal contour, no SRF, intraretinal fluid, outer retinal atrophy, epiretinal membrane (perifoveal cystic changes; mild ERM; patchy ORA).   Notes *Images captured and stored on drive  Diagnosis / Impression:  CME OU (OD>OS)   Clinical management:  See below  Abbreviations: NFP - Normal foveal profile. CME - cystoid macular edema. PED - pigment epithelial detachment. IRF - intraretinal fluid. SRF - subretinal fluid. EZ - ellipsoid zone. ERM - epiretinal membrane. ORA - outer retinal atrophy. ORT - outer retinal tubulation. SRHM - subretinal hyper-reflective material        Fluorescein Angiography Optos (Transit OD)       Right Eye   Progression has no prior data. Early phase findings include delayed filling, microaneurysm, leakage. Mid/Late phase findings include microaneurysm, leakage.   Left Eye   Progression has no prior data. Early  phase findings include  microaneurysm, vascular perfusion defect, leakage, staining. Mid/Late phase findings include staining, leakage, microaneurysm, vascular perfusion defect.   Notes Images stored on drive;   Impression: OD: central petaloid leakage and scattered punctate leakage inferior hemisphere consistent with remote inferior HRVO OS: central petaloid leakage and scattered punctate leakage superior hemisphere consistent with remote superior HRVO; temporal peripheral nonperfusion         Intravitreal Injection, Pharmacologic Agent - OD - Right Eye       Time Out 02/22/2019. 12:12 PM. Confirmed correct patient, procedure, site, and patient consented.   Anesthesia Topical anesthesia was used. Anesthetic medications included Lidocaine 2%, Proparacaine 0.5%.   Procedure Preparation included 5% betadine to ocular surface, eyelid speculum. A 30 gauge needle was used.   Injection:  1.25 mg Bevacizumab (AVASTIN) SOLN   NDC: 53664-403-47, Lot: [id number] , Expiration date: 05/21/2019   Route: Intravitreal, Site: Right Eye, Waste: 0 mL  Post-op Post injection exam found visual acuity of at least counting fingers. The patient tolerated the procedure well. There were no complications. The patient received written and verbal post procedure care education.        Intravitreal Injection, Pharmacologic Agent - OS - Left Eye       Time Out 02/22/2019. 12:13 PM. Confirmed correct patient, procedure, site, and patient consented.   Anesthesia Topical anesthesia was used. Anesthetic medications included Lidocaine 2%, Proparacaine 0.5%.   Procedure Preparation included 5% betadine to ocular surface, eyelid speculum. A 30 gauge needle was used.   Injection:  1.25 mg Bevacizumab (AVASTIN) SOLN   NDC: 03/07/2019, Lot: [id number] , Expiration date: 04/07/2019   Route: Intravitreal, Site: Left Eye, Waste: 0 mL  Post-op Post injection exam found visual acuity of at least counting fingers. The patient  tolerated the procedure well. There were no complications. The patient received written and verbal post procedure care education.                 ASSESSMENT/PLAN:    ICD-10-CM   1. Branch retinal vein occlusion of both eyes with macular edema H34.8330 Intravitreal Injection, Pharmacologic Agent - OD - Right Eye    Intravitreal Injection, Pharmacologic Agent - OS - Left Eye    Bevacizumab (AVASTIN) SOLN 1.25 mg    Bevacizumab (AVASTIN) SOLN 1.25 mg  2. Retinal edema H35.81 OCT, Retina - OU - Both Eyes  3. Primary open angle glaucoma (POAG) of both eyes, severe stage H40.1133   4. Moderate nonproliferative diabetic retinopathy of both eyes with macular edema associated with type 2 diabetes mellitus ([place]) 04/20/2019   5. Essential hypertension I10   6. Hypertensive retinopathy of both eyes H35.033 Fluorescein Angiography Optos (Transit OD)  7. Pseudophakia of both eyes Z96.1     1,2. HRVO w/ CME OU (OD > OS) - OD--remote inferior HRVO w/ CME - OS--remote superior HRVO w/ CME - pt reports history of previous injections and laser at [facility] "years ago" -- will try to obtain records - The natural history of retinal vein occlusion and macular edema and treatment options including observation, laser photocoagulation, and intravitreal antiVEGF injection with Avastin and Lucentis and Eylea and intravitreal injection of steroids with triamcinolone and Ozurdex and the complications of these procedures including loss of vision, infection, cataract, glaucoma, and retinal detachment were discussed with patient. - Specifically discussed findings from [place] / [place] study regarding patient stabilization with anti-VEGF agents and increased potential for visual improvements.  Also discussed need for frequent follow up and potentially multiple injections  given the chronic nature of the disease process - last injection per pt history, Dr. Adrian Saran, but date unknown - s/p superior segmental  PRP OS - BCVA 20/40 OD, 20/100 OS - OCT shows central CME OD, perifoveal cystic changes OS - FA shows leakage patterns consistent with HRVO w/ CME bilaterally -- OS also with significant capillary nonperfusion temporally -- may benefit from fill in segmental PRP - recommend IVA OU #1 today, 03.16.2020 - RBA of procedure discussed, questions answered - informed consent obtained and signed - see procedure note - F/U 3 weeks -- DFE/OCT/possible laser OS  3. POAG -- severe stage OU - history of CEIOL w/ [place] OU by [name] - now under the expert management of Dr. [name] - IOP 18 OD, 17 OS - currently on Cosopt BID OU - history of brimonidine toxicity  4. Moderate nonproliferative diabetic retinopathy OU - The incidence, risk factors for progression, natural history and treatment options for diabetic retinopathy were discussed with patient.   - The need for close monitoring of blood glucose, blood pressure, and serum lipids, avoiding cigarette or any type of tobacco, and the need for long term follow up was also discussed with patient. - diabetic retinopathy confounded by HRVOs described above, but may be contributing to macular edema - last A1c 6.6 on 11.13.19 - monitor  5,6. Hypertensive retinopathy OU - discussed importance of tight BP control - monitor  7. Pseudophakia OU  - s/p CE/IOL w/ [place]ass OU - Dr. Jenene Slicker (2018)  - doing well  - monitor  Ophthalmic Meds Ordered this visit:  Meds ordered this encounter  Medications  . Bevacizumab (AVASTIN) SOLN 1.25 mg  . Bevacizumab (AVASTIN) SOLN 1.25 mg       Return in about 3 weeks (around 03/15/2019) for f/u HRVO OU, PRP OS.  There are no Patient Instructions on file for this visit.   Explained the diagnoses, plan, and follow up with the patient and they expressed understanding.  Patient expressed understanding of the importance of proper follow up care.   This document serves as a record of services personally performed  by Gardiner Sleeper, MD, PhD. It was created on their behalf by Ernest Mallick, OA, an ophthalmic assistant. The creation of this record is the provider's dictation and/or activities during the visit.    Electronically signed by: Ernest Mallick, OA  03.16.2020 11:39 PM    Gardiner Sleeper, M.D., Ph.D. Diseases & Surgery of the Retina and Vitreous [facility]   I have reviewed the above documentation for accuracy and completeness, and I agree with the above. Gardiner Sleeper, M.D., Ph.D. 02/23/19 11:39 PM     Abbreviations: M myopia (nearsighted); A astigmatism; H hyperopia (farsighted); P presbyopia; Mrx spectacle prescription;  CTL contact lenses; OD right eye; OS left eye; OU both eyes  XT exotropia; ET esotropia; PEK punctate epithelial keratitis; PEE punctate epithelial erosions; DES dry eye syndrome; MGD meibomian gland dysfunction; ATs artificial tears; PFAT's preservative free artificial tears; [place] nuclear sclerotic cataract; PSC posterior subcapsular cataract; ERM epi-retinal membrane; PVD posterior vitreous detachment; RD retinal detachment; DM diabetes mellitus; DR diabetic retinopathy; NPDR non-proliferative diabetic retinopathy; PDR proliferative diabetic retinopathy; CSME clinically significant macular edema; DME diabetic macular edema; dbh dot blot hemorrhages; CWS cotton wool spot; POAG primary open angle glaucoma; C/D cup-to-disc ratio; HVF humphrey visual field; GVF goldmann visual field; OCT optical coherence tomography; IOP intraocular pressure; BRVO Branch retinal vein occlusion; CRVO central retinal vein occlusion;  CRAO central retinal artery occlusion; BRAO branch retinal artery occlusion; RT retinal tear; SB scleral buckle; PPV pars plana vitrectomy; VH Vitreous hemorrhage; PRP panretinal laser photocoagulation; IVK intravitreal kenalog; VMT vitreomacular traction; MH Macular hole;  NVD neovascularization of the disc; NVE neovascularization elsewhere; AREDS age  related eye disease study; ARMD age related macular degeneration; POAG primary open angle glaucoma; EBMD epithelial/anterior basement membrane dystrophy; ACIOL anterior chamber intraocular lens; IOL intraocular lens; PCIOL posterior chamber intraocular lens; Phaco/IOL phacoemulsification with intraocular lens placement; [place] photorefractive keratectomy; LASIK laser assisted in situ keratomileusis; HTN hypertension; DM diabetes mellitus; COPD chronic obstructive pulmonary disease

## 2019-02-22 ENCOUNTER — Ambulatory Visit (INDEPENDENT_AMBULATORY_CARE_PROVIDER_SITE_OTHER): Payer: Medicare Other | Admitting: Ophthalmology

## 2019-02-22 ENCOUNTER — Other Ambulatory Visit: Payer: Self-pay

## 2019-02-22 ENCOUNTER — Encounter (INDEPENDENT_AMBULATORY_CARE_PROVIDER_SITE_OTHER): Payer: Self-pay | Admitting: Ophthalmology

## 2019-02-22 DIAGNOSIS — H35033 Hypertensive retinopathy, bilateral: Secondary | ICD-10-CM

## 2019-02-22 DIAGNOSIS — Z961 Presence of intraocular lens: Secondary | ICD-10-CM

## 2019-02-22 DIAGNOSIS — H401133 Primary open-angle glaucoma, bilateral, severe stage: Secondary | ICD-10-CM | POA: Diagnosis not present

## 2019-02-22 DIAGNOSIS — E113313 Type 2 diabetes mellitus with moderate nonproliferative diabetic retinopathy with macular edema, bilateral: Secondary | ICD-10-CM | POA: Diagnosis not present

## 2019-02-22 DIAGNOSIS — H34833 Tributary (branch) retinal vein occlusion, bilateral, with macular edema: Secondary | ICD-10-CM | POA: Diagnosis not present

## 2019-02-22 DIAGNOSIS — H3581 Retinal edema: Secondary | ICD-10-CM | POA: Diagnosis not present

## 2019-02-22 DIAGNOSIS — I10 Essential (primary) hypertension: Secondary | ICD-10-CM

## 2019-02-23 MED ORDER — BEVACIZUMAB CHEMO INJECTION 1.25MG/0.05ML SYRINGE FOR KALEIDOSCOPE
1.2500 mg | INTRAVITREAL | Status: AC | PRN
  Administered 2019-02-23: 1.25 mg via INTRAVITREAL

## 2019-03-17 ENCOUNTER — Encounter (INDEPENDENT_AMBULATORY_CARE_PROVIDER_SITE_OTHER): Payer: Medicare Other | Admitting: Ophthalmology

## 2019-04-12 ENCOUNTER — Encounter (INDEPENDENT_AMBULATORY_CARE_PROVIDER_SITE_OTHER): Payer: Medicare Other | Admitting: Ophthalmology

## 2019-04-29 NOTE — Progress Notes (Signed)
[facility] Note  04/30/2019     CHIEF COMPLAINT Patient presents for Retina Follow Up   HISTORY OF PRESENT ILLNESS: Chelsea Gallegos is a 85 y.o. female who presents to the clinic today for:   HPI    Retina Follow Up    Patient presents with  Other (HRVO).  In both eyes (OS>OD).  Severity is moderate.  Duration of 9 weeks.  Since onset it is gradually worsening.  I, the attending physician,  performed the HPI with the patient and updated documentation appropriately.          Comments    Patient states vision worse OS>OD, patient concerned because it is difficult to read even with reading glasses. Patient states she is using durezol bid and cosopt bid, both gtts OU per Dr. [name]. Hasn't checked BS recently. A1c was 6.6 in November 2019. Patient not currently taking any meds for diabetes, BP, etc. Only oral med is 81 mg aspirin. States that all other medications cause stomach upset.        Last edited by [name], MD on 04/30/2019  9:21 AM. (History)    pt states   Referring physician: Benay Pike, MD [address] [place], [place] [address]  HISTORICAL INFORMATION:   Selected notes from the MEDICAL RECORD NUMBER Referred by Dr. [name] for concern of worsening CME OD LEE: 03.05.20 (C. Weaver) [BCVA: OD: 20/40 OS: 20/100] Ocular Hx-POAG, pseudo OU, CME OU, BRVO OU, follicular conjunctivitis OU, iritis OU, NPDR OU, scar on posterior pole OS, last visit with [name] (02.2015), received 15 Avastins/10 Lucentis, focal laser x2, PRP (02.22.12) PMH-DM (A1c: 6.6, takes metformin), HLD, asthma    CURRENT MEDICATIONS: Current Outpatient Medications (Ophthalmic Drugs)  Medication Sig  . dorzolamide-timolol (COSOPT) 22.3-6.8 MG/ML ophthalmic solution Place 1 drop into both eyes 2 (two) times daily.  . dorzolamide (TRUSOPT) 2 % ophthalmic solution Place 1 drop into both eyes 2 (two) times daily.   No current facility-administered  medications for this visit.  (Ophthalmic Drugs)   Current Outpatient Medications (Other)  Medication Sig  . aspirin 81 MG chewable tablet Chew 162 mg by mouth once.  Marland Kitchen apixaban (ELIQUIS) 5 MG TABS tablet Take 1 tablet (5 mg total) by mouth 2 (two) times daily. (Patient not taking: Reported on 04/30/2019)  . atorvastatin (LIPITOR) 40 MG tablet Take 1 tablet (40 mg total) by mouth daily at 6 PM. (Patient not taking: Reported on 02/22/2019)  . digoxin (LANOXIN) 0.125 MG tablet Take 1 tablet (0.125 mg total) by mouth every Monday, Wednesday, and Friday. (Patient not taking: Reported on 02/22/2019)  . diltiazem (CARDIZEM) 60 MG tablet Take 1 tablet (60 mg total) by mouth 2 (two) times daily. (Patient not taking: Reported on 02/22/2019)  . metFORMIN (GLUCOPHAGE) 500 MG tablet Take 1 tablet (500 mg total) by mouth 2 (two) times daily with a meal. (Patient not taking: Reported on 02/22/2019)  . potassium chloride (K-DUR,KLOR-CON) 10 MEQ tablet Take 1 tablet (10 mEq total) by mouth 2 (two) times daily. (Patient not taking: Reported on 02/22/2019)   No current facility-administered medications for this visit.  (Other)      REVIEW OF SYSTEMS: ROS    Positive for: Endocrine, Eyes   Negative for: Constitutional, Gastrointestinal, Neurological, Skin, Genitourinary, Musculoskeletal, HENT, Cardiovascular, Respiratory, Psychiatric, Allergic/Imm, Heme/Lymph   Last edited by Roselee Nova D on 04/30/2019  7:54 AM. (History)       ALLERGIES No Known Allergies  PAST MEDICAL HISTORY Past Medical History:  Diagnosis Date  . Arthritis    "arms" (10/20/2018)  . Asthma   . GERD (gastroesophageal reflux disease)   . History of gout   . History of hiatal hernia   . History of kidney stones   . Hyperlipidemia ~2005  . Hypertension   . Refusal of blood transfusions as patient is Jehovah's Witness   . Stroke [facility]) 04/2017   "dr told me I had a stroke in my left eye" (10/20/2018)  . Type II diabetes mellitus  ([place])    Past Surgical History:  Procedure Laterality Date  . CATARACT EXTRACTION Bilateral    [name]  . CATARACT EXTRACTION, BILATERAL Bilateral 2018  . CHOLECYSTECTOMY     pt does not recall this OR (10/20/2018)  . CYSTOSCOPY W/ STONE MANIPULATION    . EXCISIONAL HEMORRHOIDECTOMY  1980s/1990s   "& he cut my muscle that controls my bowels"  . EYE SURGERY    . FRACTURE SURGERY    . GLAUCOMA SURGERY Bilateral 2018  . HERNIA REPAIR    . KNEE ARTHROSCOPY Left 04/2012    WITH MEDIAL MENISECTOMY/notes 04/16/2012  . LEFT HEART CATH AND CORONARY ANGIOGRAPHY N/A 10/21/2018   Procedure: LEFT HEART CATH AND CORONARY ANGIOGRAPHY;  Surgeon: Dixie Dials, MD;  Location: [facility];  Service: Cardiovascular;  Laterality: N/A;  . PATELLA FRACTURE SURGERY Right ~ 2006  . UMBILICAL HERNIA REPAIR  2000s    FAMILY HISTORY Family History  Problem Relation Age of Onset  . Hypertension Mother   . Hypertension Father   . Breast cancer Daughter     SOCIAL HISTORY Social History   Tobacco Use  . Smoking status: Former Smoker    Packs/day: 3.00    Years: 20.00    Pack years: 60.00    Types: Cigarettes    Last attempt to quit: 12/09/1972    Years since quitting: 46.4  . Smokeless tobacco: Never Used  Substance Use Topics  . Alcohol use: Yes    Alcohol/week: 1.0 standard drinks    Types: 1 Standard drinks or equivalent per week  . Drug use: Never         OPHTHALMIC EXAM:  Base Eye Exam    Visual Acuity (Snellen - Linear)      Right Left   Dist [place] 20/40 -2 20/100 -1   Dist ph [place] 20/25 -2 20/100 +2       Tonometry (Tonopen, 8:19 AM)      Right Left   Pressure 18 18       Pupils      Dark Light Shape React APD   Right 4 3 Round Slow None   Left 4 3.5 Round Minimal +1       Visual Fields      Left Right   Restrictions Partial outer superior temporal, inferior temporal, superior nasal, inferior nasal deficiencies Partial outer inferior temporal, superior nasal,  inferior nasal deficiencies       Extraocular Movement      Right Left    Full, Ortho Full, Ortho       Neuro/Psych    Oriented x3:  Yes   Mood/Affect:  Normal       Dilation    Both eyes:  1.0% Mydriacyl, 2.5% Phenylephrine @ 8:21 AM        Slit Lamp and Fundus Exam    Slit Lamp Exam      Right Left   Lids/Lashes Dermatochalasis -  upper lid Dermatochalasis - upper lid   Conjunctiva/Sclera mild Melanosis mild Melanosis   Cornea Arcus, 1-2+ Punctate epithelial erosions, Well healed temporal cataract wounds Arcus, 2+ Punctate epithelial erosions, Well healed cataract wounds   Anterior Chamber deep and clear, cypass device at 0300 deep and clear, cypass device at 0800   Iris Round and dilated, No NVI Round and dilated, No NVI   Lens PC IOL in good position PC IOL in good position   Vitreous Vitreous syneresis Vitreous syneresis       Fundus Exam      Right Left   Disc +cupping, 3+pallor, +heme at 0700, temporal PPP +cupping, 3-4+pallor, non-existent inferior rim, temporal PPP   C/D Ratio 0.85 0.95   Macula Improved foveal reflex, improved edema, no heme Blunted foveal reflex,RPE mottling and clumping, Cystic changes - slightly improved   Vessels Attenuated, Tortuous, venules dilated, AV crossing changes Attenuated, venules Dilated and Tortuous, scleratic vessels temporal periphery   Periphery attached, scattered RPE changes, mild IRH/DBH inferior hemishphere Attached, scattered RPE changes, sectoral PRP scars superiorly, sclerotiic vessels temporal periphery        Refraction    Manifest Refraction      Sphere Cylinder Axis Dist VA   Right -1.00 +1.25 020 20/25-2   Left +0.25 +0.75 160 20/70+2          IMAGING AND PROCEDURES  Imaging and Procedures for @TODAY @  OCT, Retina - OU - Both Eyes       Right Eye Quality was good. Central Foveal Thickness: 282. Progression has improved. Findings include no IRF, no SRF, normal foveal contour, epiretinal membrane, macular  pucker (Interval resolution of CME).   Left Eye Quality was good. Central Foveal Thickness: 261. Progression has improved. Findings include normal foveal contour, no SRF, intraretinal fluid, outer retinal atrophy, epiretinal membrane (Interval improvement in IRF, mild pucker).   Notes *Images captured and stored on drive  Diagnosis / Impression:  OD: Interval resolution of CME OS: Interval improvement in IRF, mild pucker   Clinical management:  See below  Abbreviations: NFP - Normal foveal profile. CME - cystoid macular edema. PED - pigment epithelial detachment. IRF - intraretinal fluid. SRF - subretinal fluid. EZ - ellipsoid zone. ERM - epiretinal membrane. ORA - outer retinal atrophy. ORT - outer retinal tubulation. SRHM - subretinal hyper-reflective material        Intravitreal Injection, Pharmacologic Agent - OD - Right Eye       Time Out 04/30/2019. 9:15 AM. Confirmed correct patient, procedure, site, and patient consented.   Anesthesia Topical anesthesia was used. Anesthetic medications included Lidocaine 2%, Proparacaine 0.5%.   Procedure Preparation included 5% betadine to ocular surface, eyelid speculum. A 30 gauge needle was used.   Injection:  1.25 mg Bevacizumab (AVASTIN) SOLN   NDC: 60737-106-26, Lot: [id number] , Expiration date: 05/21/2019   Route: Intravitreal, Site: Right Eye, Waste: 0 mL  Post-op Post injection exam found visual acuity of at least counting fingers. The patient tolerated the procedure well. There were no complications. The patient received written and verbal post procedure care education.        Intravitreal Injection, Pharmacologic Agent - OS - Left Eye       Time Out 04/30/2019. 9:16 AM. Confirmed correct patient, procedure, site, and patient consented.   Anesthesia Topical anesthesia was used. Anesthetic medications included Lidocaine 2%, Proparacaine 0.5%.   Procedure Preparation included 5% betadine to ocular surface,  eyelid speculum. A 30 gauge needle was used.   Injection:  1.25 mg Bevacizumab (AVASTIN) SOLN   NDC: 29937-169-67, Lot: [id number] , Expiration date: 06/30/2019   Route: Intravitreal, Site: Left Eye, Waste: 0 mL  Post-op Post injection exam found visual acuity of at least counting fingers. The patient tolerated the procedure well. There were no complications. The patient received written and verbal post procedure care education.                 ASSESSMENT/PLAN:    ICD-10-CM   1. Branch retinal vein occlusion of both eyes with macular edema H34.8330 Intravitreal Injection, Pharmacologic Agent - OD - Right Eye    Intravitreal Injection, Pharmacologic Agent - OS - Left Eye    Bevacizumab (AVASTIN) SOLN 1.25 mg    Bevacizumab (AVASTIN) SOLN 1.25 mg  2. Retinal edema H35.81 OCT, Retina - OU - Both Eyes  3. Primary open angle glaucoma (POAG) of both eyes, severe stage H40.1133   4. Moderate nonproliferative diabetic retinopathy of both eyes with macular edema associated with type 2 diabetes mellitus ([place]) [address]   5. Essential hypertension I10   6. Hypertensive retinopathy of both eyes H35.033   7. Pseudophakia of both eyes Z96.1     1,2. HRVO w/ CME OU (OD > OS)  - OD--remote inferior HRVO w/ CME  - OS--remote superior HRVO w/ CME  - pt reports history of previous injections and laser at [facility] "years ago" -- able to obtain records  - last injection per pt history, Dr. [facility] in 2015  - s/p superior segmental PRP OU  - s/p IVA OU #1 (03.16.20) -- delayed f/u to 2+ mos, supposed to be 3-4 wks  - BCVA improved to 20/25 from 20/40 OD, OS stable at 20/100+2  - OCT shows interval resolution of CME OD, interval improvement in IRF OS  - FA (03.16.20) shows leakage patterns consistent with HRVO w/ CME bilaterally -- OS also with significant capillary nonperfusion temporally -- may benefit from fill in segmental PRP  - recommend IVA OU #2 today, 05.22.20  - RBA of  procedure discussed, questions answered  - informed consent obtained and signed  - see procedure note  - F/U 3 weeks -- DFE/OCT/possible laser OS  3. POAG -- severe stage OU  - history of CEIOL w/ [place] OU by Dr. 06.04.20  - now under the expert management of [name]  - IOP 18 OD, 17 OS  - currently on Cosopt BID OU  - history of brimonidine toxicity  4. Moderate nonproliferative diabetic retinopathy OU  - The incidence, risk factors for progression, natural history and treatment options for diabetic retinopathy were discussed with patient.    - The need for close monitoring of blood glucose, blood pressure, and serum lipids, avoiding cigarette or any type of tobacco, and the need for long term follow up was also discussed with patient.  - diabetic retinopathy confounded by HRVOs described above, but may be contributing to macular edema  - last A1c 6.6 on 11.13.19  - monitor  5,6. Hypertensive retinopathy OU  - discussed importance of tight BP control  - monitor  7. Pseudophakia OU  - s/p CE/IOL w/ [place] OU - Dr. 11.26.19 (2018)  - doing well  - monitor  Ophthalmic Meds Ordered this visit:  Meds ordered this encounter  Medications  . Bevacizumab (AVASTIN) SOLN 1.25 mg  . Bevacizumab (AVASTIN) SOLN 1.25 mg       Return in about 5 weeks (around 06/04/2019) for f/u HRVO OU, DFE, OCT, possible laser.  There are no Patient Instructions on file for this visit.   Explained the diagnoses, plan, and follow up with the patient and they expressed understanding.  Patient expressed understanding of the importance of proper follow up care.   This document serves as a record of services personally performed by Gardiner Sleeper, MD, PhD. It was created on their behalf by Ernest Mallick, OA, an ophthalmic assistant. The creation of this record is the provider's dictation and/or activities during the visit.    Electronically signed by: Ernest Mallick, OA  05.21.2020 1:18 AM   Gardiner Sleeper, M.D., Ph.D. Diseases & Surgery of the Retina and Vitreous [facility]  I have reviewed the above documentation for accuracy and completeness, and I agree with the above. Gardiner Sleeper, M.D., Ph.D. 05/01/19 1:18 AM   Abbreviations: M myopia (nearsighted); A astigmatism; H hyperopia (farsighted); P presbyopia; Mrx spectacle prescription;  CTL contact lenses; OD right eye; OS left eye; OU both eyes  XT exotropia; ET esotropia; PEK punctate epithelial keratitis; PEE punctate epithelial erosions; DES dry eye syndrome; MGD meibomian gland dysfunction; ATs artificial tears; PFAT's preservative free artificial tears; [place] nuclear sclerotic cataract; PSC posterior subcapsular cataract; ERM epi-retinal membrane; PVD posterior vitreous detachment; RD retinal detachment; DM diabetes mellitus; DR diabetic retinopathy; NPDR non-proliferative diabetic retinopathy; PDR proliferative diabetic retinopathy; CSME clinically significant macular edema; DME diabetic macular edema; dbh dot blot hemorrhages; CWS cotton wool spot; POAG primary open angle glaucoma; C/D cup-to-disc ratio; HVF humphrey visual field; GVF goldmann visual field; OCT optical coherence tomography; IOP intraocular pressure; BRVO Branch retinal vein occlusion; CRVO central retinal vein occlusion; CRAO central retinal artery occlusion; BRAO branch retinal artery occlusion; RT retinal tear; SB scleral buckle; PPV pars plana vitrectomy; VH Vitreous hemorrhage; PRP panretinal laser photocoagulation; IVK intravitreal kenalog; VMT vitreomacular traction; MH Macular hole;  NVD neovascularization of the disc; NVE neovascularization elsewhere; AREDS age related eye disease study; ARMD age related macular degeneration; POAG primary open angle glaucoma; EBMD epithelial/anterior basement membrane dystrophy; ACIOL anterior chamber intraocular lens; IOL intraocular lens; PCIOL posterior chamber intraocular lens; Phaco/IOL phacoemulsification  with intraocular lens placement; [place] photorefractive keratectomy; LASIK laser assisted in situ keratomileusis; HTN hypertension; DM diabetes mellitus; COPD chronic obstructive pulmonary disease

## 2019-04-30 ENCOUNTER — Other Ambulatory Visit: Payer: Self-pay

## 2019-04-30 ENCOUNTER — Encounter (INDEPENDENT_AMBULATORY_CARE_PROVIDER_SITE_OTHER): Payer: Self-pay | Admitting: Ophthalmology

## 2019-04-30 ENCOUNTER — Ambulatory Visit (INDEPENDENT_AMBULATORY_CARE_PROVIDER_SITE_OTHER): Payer: Medicare Other | Admitting: Ophthalmology

## 2019-04-30 DIAGNOSIS — H3581 Retinal edema: Secondary | ICD-10-CM | POA: Diagnosis not present

## 2019-04-30 DIAGNOSIS — Z961 Presence of intraocular lens: Secondary | ICD-10-CM

## 2019-04-30 DIAGNOSIS — I10 Essential (primary) hypertension: Secondary | ICD-10-CM

## 2019-04-30 DIAGNOSIS — H401133 Primary open-angle glaucoma, bilateral, severe stage: Secondary | ICD-10-CM | POA: Diagnosis not present

## 2019-04-30 DIAGNOSIS — E113313 Type 2 diabetes mellitus with moderate nonproliferative diabetic retinopathy with macular edema, bilateral: Secondary | ICD-10-CM

## 2019-04-30 DIAGNOSIS — H34833 Tributary (branch) retinal vein occlusion, bilateral, with macular edema: Secondary | ICD-10-CM | POA: Diagnosis not present

## 2019-04-30 DIAGNOSIS — H35033 Hypertensive retinopathy, bilateral: Secondary | ICD-10-CM

## 2019-05-01 MED ORDER — BEVACIZUMAB CHEMO INJECTION 1.25MG/0.05ML SYRINGE FOR KALEIDOSCOPE
1.2500 mg | INTRAVITREAL | Status: AC | PRN
  Administered 2019-05-01: 1.25 mg via INTRAVITREAL

## 2019-06-02 NOTE — Progress Notes (Signed)
[facility] Note  06/04/2019     CHIEF COMPLAINT Patient presents for Retina Follow Up   HISTORY OF PRESENT ILLNESS: Chelsea Gallegos is a 84 y.o. female who presents to the clinic today for:   HPI    Retina Follow Up    Patient presents with  Other.  In both eyes.  This started 5 weeks ago.  Severity is moderate.  I, the attending physician,  performed the HPI with the patient and updated documentation appropriately.          Comments    Patient here for 5 weeks retina follow up for HRVO with CME OU. Patient states vision about the same. No eye pain.       Last edited by [name], MD on 06/06/2019  9:40 PM. (History)      Referring physician: Benay Pike, MD [address],  [place] [address]  HISTORICAL INFORMATION:   Selected notes from the MEDICAL RECORD NUMBER Referred by Dr. [name] for concern of worsening CME OD LEE: 03.05.20 (C. Weaver) [BCVA: OD: 20/40 OS: 20/100] Ocular Hx-POAG, pseudo OU, CME OU, BRVO OU, follicular conjunctivitis OU, iritis OU, NPDR OU, scar on posterior pole OS, last visit with [name] (02.2015), received 15 Avastins/10 Lucentis, focal laser x2, PRP (02.22.12) PMH-DM (A1c: 6.6, takes metformin), HLD, asthma    CURRENT MEDICATIONS: Current Outpatient Medications (Ophthalmic Drugs)  Medication Sig  . dorzolamide (TRUSOPT) 2 % ophthalmic solution Place 1 drop into both eyes 2 (two) times daily.  . dorzolamide-timolol (COSOPT) 22.3-6.8 MG/ML ophthalmic solution Place 1 drop into both eyes 2 (two) times daily.   No current facility-administered medications for this visit.  (Ophthalmic Drugs)   Current Outpatient Medications (Other)  Medication Sig  . apixaban (ELIQUIS) 5 MG TABS tablet Take 1 tablet (5 mg total) by mouth 2 (two) times daily. (Patient not taking: Reported on 04/30/2019)  . aspirin 81 MG chewable tablet Chew 162 mg by mouth once.  Marland Kitchen atorvastatin (LIPITOR) 40 MG tablet  Take 1 tablet (40 mg total) by mouth daily at 6 PM. (Patient not taking: Reported on 02/22/2019)  . digoxin (LANOXIN) 0.125 MG tablet Take 1 tablet (0.125 mg total) by mouth every Monday, Wednesday, and Friday. (Patient not taking: Reported on 02/22/2019)  . diltiazem (CARDIZEM) 60 MG tablet Take 1 tablet (60 mg total) by mouth 2 (two) times daily. (Patient not taking: Reported on 02/22/2019)  . metFORMIN (GLUCOPHAGE) 500 MG tablet Take 1 tablet (500 mg total) by mouth 2 (two) times daily with a meal. (Patient not taking: Reported on 02/22/2019)  . potassium chloride (K-DUR,KLOR-CON) 10 MEQ tablet Take 1 tablet (10 mEq total) by mouth 2 (two) times daily. (Patient not taking: Reported on 02/22/2019)   No current facility-administered medications for this visit.  (Other)      REVIEW OF SYSTEMS: ROS    Positive for: Endocrine, Eyes   Negative for: Constitutional, Gastrointestinal, Neurological, Skin, Genitourinary, Musculoskeletal, HENT, Cardiovascular, Respiratory, Psychiatric, Allergic/Imm, Heme/Lymph   Last edited by Theodore Demark on 06/04/2019  8:51 AM. (History)       ALLERGIES No Known Allergies  PAST MEDICAL HISTORY Past Medical History:  Diagnosis Date  . Arthritis    "arms" (10/20/2018)  . Asthma   . GERD (gastroesophageal reflux disease)   . History of gout   . History of hiatal hernia   . History of kidney stones   . Hyperlipidemia ~2005  . Hypertension   .  Refusal of blood transfusions as patient is Jehovah's Witness   . Stroke [facility]) 04/2017   "dr told me I had a stroke in my left eye" (10/20/2018)  . Type II diabetes mellitus ([place])    Past Surgical History:  Procedure Laterality Date  . CATARACT EXTRACTION Bilateral    [name]  . CATARACT EXTRACTION, BILATERAL Bilateral 2018  . CHOLECYSTECTOMY     pt does not recall this OR (10/20/2018)  . CYSTOSCOPY W/ STONE MANIPULATION    . EXCISIONAL HEMORRHOIDECTOMY  1980s/1990s   "& he cut my muscle that controls my  bowels"  . EYE SURGERY    . FRACTURE SURGERY    . GLAUCOMA SURGERY Bilateral 2018  . HERNIA REPAIR    . KNEE ARTHROSCOPY Left 04/2012    WITH MEDIAL MENISECTOMY/notes 04/16/2012  . LEFT HEART CATH AND CORONARY ANGIOGRAPHY N/A 10/21/2018   Procedure: LEFT HEART CATH AND CORONARY ANGIOGRAPHY;  Surgeon: Dixie Dials, MD;  Location: [place] [facility];  Service: Cardiovascular;  Laterality: N/A;  . PATELLA FRACTURE SURGERY Right ~ 2006  . UMBILICAL HERNIA REPAIR  2000s    FAMILY HISTORY Family History  Problem Relation Age of Onset  . Hypertension Mother   . Hypertension Father   . Breast cancer Daughter     SOCIAL HISTORY Social History   Tobacco Use  . Smoking status: Former Smoker    Packs/day: 3.00    Years: 20.00    Pack years: 60.00    Types: Cigarettes    Quit date: 12/09/1972    Years since quitting: 46.5  . Smokeless tobacco: Never Used  Substance Use Topics  . Alcohol use: Yes    Alcohol/week: 1.0 standard drinks    Types: 1 Standard drinks or equivalent per week  . Drug use: Never         OPHTHALMIC EXAM:  Base Eye Exam    Visual Acuity (Snellen - Linear)      Right Left   Dist [place] 20/25 20/80   Dist ph [place]  20/70 -2       Tonometry (Tonopen, 8:48 AM)      Right Left   Pressure 15 15       Pupils      Dark Light Shape React APD   Right 4 3 Round Slow None   Left 4 3.5 Round Minimal +1       Visual Fields      Left Right   Restrictions Partial outer superior temporal, inferior temporal, superior nasal, inferior nasal deficiencies Partial outer inferior temporal, superior nasal, inferior nasal deficiencies       Extraocular Movement      Right Left    Full, Ortho Full, Ortho       Neuro/Psych    Oriented x3: Yes   Mood/Affect: Normal       Dilation    Both eyes: 1.0% Mydriacyl, 2.5% Phenylephrine @ 8:47 AM        Slit Lamp and Fundus Exam    Slit Lamp Exam      Right Left   Lids/Lashes Dermatochalasis - upper lid Dermatochalasis -  upper lid   Conjunctiva/Sclera mild Melanosis mild Melanosis   Cornea Arcus, 1-2+ Punctate epithelial erosions, Well healed temporal cataract wounds Arcus, 2+ Punctate epithelial erosions, Well healed cataract wounds   Anterior Chamber deep and clear, cypass device at 0300 deep and clear, cypass device at 0800   Iris Round and dilated, No NVI Round and dilated, No NVI  Lens PC IOL in good position PC IOL in good position   Vitreous Vitreous syneresis Vitreous syneresis, vitreous condensations       Fundus Exam      Right Left   Disc +cupping, 3+pallor, +heme at 0700, temporal PPP +cupping, 3-4+pallor, non-existent inferior rim, temporal PPP   C/D Ratio 0.85 0.95   Macula Flat, good foveal reflex, improved edema, no heme improved foveal reflex, RPE mottling and clumping, Cystic changes - slightly improved, mild exudates temporal macula   Vessels Attenuated, Tortuous, venules dilated, AV crossing changes Attenuated, venules Dilated and Tortuous, scleratic vessels temporal periphery   Periphery attached, scattered RPE changes/peripheral drusen, mild IRH/DBH inferior hemishphere - improving Attached, scattered RPE changes, sectoral PRP scars superiorly, sclerotic vessels temporal periphery          IMAGING AND PROCEDURES  Imaging and Procedures for @TODAY @  OCT, Retina - OU - Both Eyes       Right Eye Quality was borderline. Central Foveal Thickness: 288. Progression has been stable. Findings include no IRF, no SRF, normal foveal contour, epiretinal membrane, macular pucker (Trace cystic changes).   Left Eye Quality was good. Central Foveal Thickness: 249. Progression has improved. Findings include normal foveal contour, no SRF, intraretinal fluid, outer retinal atrophy, epiretinal membrane (Interval improvement in IRF, mild pucker).   Notes *Images captured and stored on drive  Diagnosis / Impression:  OD: Interval resolution of CME --Trace cystic changes  OS: Interval  improvement in IRF, mild pucker   Clinical management:  See below  Abbreviations: NFP - Normal foveal profile. CME - cystoid macular edema. PED - pigment epithelial detachment. IRF - intraretinal fluid. SRF - subretinal fluid. EZ - ellipsoid zone. ERM - epiretinal membrane. ORA - outer retinal atrophy. ORT - outer retinal tubulation. SRHM - subretinal hyper-reflective material        Intravitreal Injection, Pharmacologic Agent - OD - Right Eye       Time Out 06/04/2019. 9:25 AM. Confirmed correct patient, procedure, site, and patient consented.   Anesthesia Topical anesthesia was used. Anesthetic medications included Lidocaine 2%, Proparacaine 0.5%.   Procedure Preparation included 5% betadine to ocular surface, eyelid speculum. A supplied needle was used.   Injection:  1.25 mg Bevacizumab (AVASTIN) SOLN   NDC: 40981-191-47, Lot: [id number] , Expiration date: 06/30/2019   Route: Intravitreal, Site: Right Eye, Waste: 0 mL  Post-op Post injection exam found visual acuity of at least counting fingers. The patient tolerated the procedure well. There were no complications. The patient received written and verbal post procedure care education.        Intravitreal Injection, Pharmacologic Agent - OS - Left Eye       Time Out 06/04/2019. 9:25 AM. Confirmed correct patient, procedure, site, and patient consented.   Anesthesia Topical anesthesia was used. Anesthetic medications included Lidocaine 2%, Proparacaine 0.5%.   Procedure Preparation included 5% betadine to ocular surface, eyelid speculum. A supplied needle was used.   Injection:  1.25 mg Bevacizumab (AVASTIN) SOLN   NDC: 06/17/2019, Lot: [id number] , Expiration date: 07/21/2019   Route: Intravitreal, Site: Left Eye, Waste: 0 mL  Post-op Post injection exam found visual acuity of at least counting fingers. The patient tolerated the procedure well. There were no complications. The patient received written and verbal  post procedure care education.                 ASSESSMENT/PLAN:    ICD-10-CM   1. Branch retinal vein occlusion of both eyes with macular edema  P53.6144 Intravitreal Injection, Pharmacologic Agent - OD - Right Eye    Intravitreal Injection, Pharmacologic Agent - OS - Left Eye    Bevacizumab (AVASTIN) SOLN 1.25 mg    Bevacizumab (AVASTIN) SOLN 1.25 mg  2. Retinal edema  H35.81 OCT, Retina - OU - Both Eyes  3. Primary open angle glaucoma (POAG) of both eyes, severe stage  H40.1133   4. Moderate nonproliferative diabetic retinopathy of both eyes with macular edema associated with type 2 diabetes mellitus ([place])  R15.4008   5. Essential hypertension  I10   6. Hypertensive retinopathy of both eyes  H35.033   7. Pseudophakia of both eyes  Z96.1     1,2. HRVO w/ CME OU (OD > OS)  - OD--remote inferior HRVO w/ CME  - OS--remote superior HRVO w/ CME  - pt reports history of previous injections and laser at [facility] "years ago" -- able to obtain records  - last injection per pt history, Dr. Adrian Saran in 2015  - s/p superior segmental PRP OU  - s/p IVA OU #1 (03.16.20), #2 (05.22.20)  - BCVA stable at 20/25 OD, OS improved to 20/70 from 20/100+2  - OCT shows trace cystic changes OD, interval improvement in IRF OS  - FA (03.16.20) shows leakage patterns consistent with HRVO w/ CME bilaterally -- OS also with significant capillary nonperfusion temporally -- may benefit from fill in segmental PRP  - recommend IVA OU #3 today, 06.26.20  - may be able to begin extension on right eye as it's back to baseline; OS likely to continue with injections  - RBA of procedure discussed, questions answered  - informed consent obtained and signed  - see procedure note  - F/U July 2, 8:00AM -- DFE/OCT/possible laser PRP OS  3. POAG -- severe stage OU  - history of CEIOL w/ [place] OU by [name]  - now under the expert management of Dr. [name]  - IOP 15 OU  - currently on Cosopt BID  OU  - history of brimonidine toxicity  4. Moderate nonproliferative diabetic retinopathy OU  - The incidence, risk factors for progression, natural history and treatment options for diabetic retinopathy were discussed with patient.    - The need for close monitoring of blood glucose, blood pressure, and serum lipids, avoiding cigarette or any type of tobacco, and the need for long term follow up was also discussed with patient.  - diabetic retinopathy confounded by HRVOs described above, but may be contributing to macular edema  - last A1c 6.6 on 11.13.19  - monitor  5,6. Hypertensive retinopathy OU  - discussed importance of tight BP control  - monitor  7. Pseudophakia OU  - s/p CE/IOL w/ [place] OU - Dr. Jenene Slicker (2018)  - doing well  - monitor  Ophthalmic Meds Ordered this visit:  Meds ordered this encounter  Medications  . Bevacizumab (AVASTIN) SOLN 1.25 mg  . Bevacizumab (AVASTIN) SOLN 1.25 mg       Return in about 6 days (around 06/10/2019) for f/u HRVO OU, DFE, OCT, Laser OS.  There are no Patient Instructions on file for this visit.   Explained the diagnoses, plan, and follow up with the patient and they expressed understanding.  Patient expressed understanding of the importance of proper follow up care.   This document serves as a record of services personally performed by Gardiner Sleeper, MD, PhD. It was created on their behalf by Ernest Mallick, OA, an ophthalmic assistant. The creation of this  record is the provider's dictation and/or activities during the visit.    Electronically signed by: Ernest Mallick, OA  06.24.2020 9:41 PM    Gardiner Sleeper, M.D., Ph.D. Diseases & Surgery of the Retina and Vitreous [facility]  I have reviewed the above documentation for accuracy and completeness, and I agree with the above. Gardiner Sleeper, M.D., Ph.D. 06/06/19 9:43 PM    Abbreviations: M myopia (nearsighted); A astigmatism; H hyperopia  (farsighted); P presbyopia; Mrx spectacle prescription;  CTL contact lenses; OD right eye; OS left eye; OU both eyes  XT exotropia; ET esotropia; PEK punctate epithelial keratitis; PEE punctate epithelial erosions; DES dry eye syndrome; MGD meibomian gland dysfunction; ATs artificial tears; PFAT's preservative free artificial tears; [place] nuclear sclerotic cataract; PSC posterior subcapsular cataract; ERM epi-retinal membrane; PVD posterior vitreous detachment; RD retinal detachment; DM diabetes mellitus; DR diabetic retinopathy; NPDR non-proliferative diabetic retinopathy; PDR proliferative diabetic retinopathy; CSME clinically significant macular edema; DME diabetic macular edema; dbh dot blot hemorrhages; CWS cotton wool spot; POAG primary open angle glaucoma; C/D cup-to-disc ratio; HVF humphrey visual field; GVF goldmann visual field; OCT optical coherence tomography; IOP intraocular pressure; BRVO Branch retinal vein occlusion; CRVO central retinal vein occlusion; CRAO central retinal artery occlusion; BRAO branch retinal artery occlusion; RT retinal tear; SB scleral buckle; PPV pars plana vitrectomy; VH Vitreous hemorrhage; PRP panretinal laser photocoagulation; IVK intravitreal kenalog; VMT vitreomacular traction; MH Macular hole;  NVD neovascularization of the disc; NVE neovascularization elsewhere; AREDS age related eye disease study; ARMD age related macular degeneration; POAG primary open angle glaucoma; EBMD epithelial/anterior basement membrane dystrophy; ACIOL anterior chamber intraocular lens; IOL intraocular lens; PCIOL posterior chamber intraocular lens; Phaco/IOL phacoemulsification with intraocular lens placement; [place] photorefractive keratectomy; LASIK laser assisted in situ keratomileusis; HTN hypertension; DM diabetes mellitus; COPD chronic obstructive pulmonary disease

## 2019-06-04 ENCOUNTER — Other Ambulatory Visit: Payer: Self-pay

## 2019-06-04 ENCOUNTER — Encounter (INDEPENDENT_AMBULATORY_CARE_PROVIDER_SITE_OTHER): Payer: Self-pay | Admitting: Ophthalmology

## 2019-06-04 ENCOUNTER — Ambulatory Visit (INDEPENDENT_AMBULATORY_CARE_PROVIDER_SITE_OTHER): Payer: Medicare Other | Admitting: Ophthalmology

## 2019-06-04 DIAGNOSIS — E113313 Type 2 diabetes mellitus with moderate nonproliferative diabetic retinopathy with macular edema, bilateral: Secondary | ICD-10-CM | POA: Diagnosis not present

## 2019-06-04 DIAGNOSIS — H34833 Tributary (branch) retinal vein occlusion, bilateral, with macular edema: Secondary | ICD-10-CM

## 2019-06-04 DIAGNOSIS — H401133 Primary open-angle glaucoma, bilateral, severe stage: Secondary | ICD-10-CM

## 2019-06-04 DIAGNOSIS — I10 Essential (primary) hypertension: Secondary | ICD-10-CM

## 2019-06-04 DIAGNOSIS — H3581 Retinal edema: Secondary | ICD-10-CM

## 2019-06-04 DIAGNOSIS — H35033 Hypertensive retinopathy, bilateral: Secondary | ICD-10-CM

## 2019-06-04 DIAGNOSIS — Z961 Presence of intraocular lens: Secondary | ICD-10-CM

## 2019-06-04 MED ORDER — BEVACIZUMAB CHEMO INJECTION 1.25MG/0.05ML SYRINGE FOR KALEIDOSCOPE
1.2500 mg | INTRAVITREAL | Status: AC | PRN
  Administered 2019-06-04: 1.25 mg via INTRAVITREAL

## 2019-06-09 NOTE — Progress Notes (Signed)
[facility] Note  06/10/2019     CHIEF COMPLAINT Patient presents for Retina Follow Up   HISTORY OF PRESENT ILLNESS: Chelsea Gallegos is a 85 y.o. female who presents to the clinic today for:   HPI    Retina Follow Up    Patient presents with  CRVO/BRVO.  In both eyes.  This started months ago.  Severity is moderate.  Duration of 1 week.  Since onset it is gradually improving.  I, the attending physician,  performed the HPI with the patient and updated documentation appropriately.          Comments    85 y/o female pt here for 1wk f/u for HRVO w/CME OU.  Here for laser OS.  VA OU may be a bit better.  Denies pain, flashes, floaters.  Pred & Cosopt BID OU.  BS unknown.  A1C 6.6 10/2018.       Last edited by [name], MD on 06/10/2019  8:35 AM. (History)    pt here for [facility] OS today  Referring physician: Benay Pike, MD [address],  [place] [address]  HISTORICAL INFORMATION:   Selected notes from the MEDICAL RECORD NUMBER Referred by Dr. [name] for concern of worsening CME OD LEE: 03.05.20 (C. Weaver) [BCVA: OD: 20/40 OS: 20/100] Ocular Hx-POAG, pseudo OU, CME OU, BRVO OU, follicular conjunctivitis OU, iritis OU, NPDR OU, scar on posterior pole OS, last visit with [name] (02.2015), received 15 Avastins/10 Lucentis, focal laser x2, PRP (02.22.12) PMH-DM (A1c: 6.6, takes metformin), HLD, asthma    CURRENT MEDICATIONS: Current Outpatient Medications (Ophthalmic Drugs)  Medication Sig  . dorzolamide-timolol (COSOPT) 22.3-6.8 MG/ML ophthalmic solution Place 1 drop into both eyes 2 (two) times daily.  . prednisoLONE acetate (PRED FORTE) 1 % ophthalmic suspension INSTILL 1 DROP INTO EACH EYE TWICE DAILY  . dorzolamide (TRUSOPT) 2 % ophthalmic solution Place 1 drop into both eyes 2 (two) times daily.  . prednisoLONE acetate (PRED FORTE) 1 % ophthalmic suspension 1 drop 4 times daily in the left eye for 7 days then stop    No current facility-administered medications for this visit.  (Ophthalmic Drugs)   Current Outpatient Medications (Other)  Medication Sig  . apixaban (ELIQUIS) 5 MG TABS tablet Take 1 tablet (5 mg total) by mouth 2 (two) times daily.  Marland Kitchen aspirin 81 MG chewable tablet Chew 162 mg by mouth once.  Marland Kitchen atorvastatin (LIPITOR) 40 MG tablet Take 1 tablet (40 mg total) by mouth daily at 6 PM.  . digoxin (LANOXIN) 0.125 MG tablet Take 1 tablet (0.125 mg total) by mouth every Monday, Wednesday, and Friday.  . diltiazem (CARDIZEM) 60 MG tablet Take 1 tablet (60 mg total) by mouth 2 (two) times daily.  . metFORMIN (GLUCOPHAGE) 500 MG tablet Take 1 tablet (500 mg total) by mouth 2 (two) times daily with a meal.  . potassium chloride (K-DUR,KLOR-CON) 10 MEQ tablet Take 1 tablet (10 mEq total) by mouth 2 (two) times daily.   No current facility-administered medications for this visit.  (Other)      REVIEW OF SYSTEMS: ROS    Positive for: Gastrointestinal, Endocrine, Cardiovascular, Eyes   Negative for: Constitutional, Neurological, Skin, Genitourinary, Musculoskeletal, HENT, Respiratory, Psychiatric, Allergic/Imm, Heme/Lymph   Last edited by Matthew Folks, COA on 06/10/2019  8:09 AM. (History)       ALLERGIES No Known Allergies  PAST MEDICAL HISTORY Past Medical History:  Diagnosis Date  .  Arthritis    "arms" (10/20/2018)  . Asthma   . Diabetic retinopathy ([place])    NPDR OU  . GERD (gastroesophageal reflux disease)   . Glaucoma    POAG OU  . History of gout   . History of hiatal hernia   . History of kidney stones   . Hyperlipidemia ~2005  . Hypertension   . Hypertensive retinopathy    OU  . Refusal of blood transfusions as patient is Jehovah's Witness   . Stroke [facility]) 04/2017   "dr told me I had a stroke in my left eye" (10/20/2018)  . Type II diabetes mellitus ([place])    Past Surgical History:  Procedure Laterality Date  . CATARACT EXTRACTION Bilateral    [name]  .  CATARACT EXTRACTION, BILATERAL Bilateral 2018  . CHOLECYSTECTOMY     pt does not recall this OR (10/20/2018)  . CYSTOSCOPY W/ STONE MANIPULATION    . EXCISIONAL HEMORRHOIDECTOMY  1980s/1990s   "& he cut my muscle that controls my bowels"  . EYE SURGERY    . FRACTURE SURGERY    . GLAUCOMA SURGERY Bilateral 2018  . HERNIA REPAIR    . KNEE ARTHROSCOPY Left 04/2012    WITH MEDIAL MENISECTOMY/notes 04/16/2012  . LEFT HEART CATH AND CORONARY ANGIOGRAPHY N/A 10/21/2018   Procedure: LEFT HEART CATH AND CORONARY ANGIOGRAPHY;  Surgeon: Dixie Dials, MD;  Location: [facility];  Service: Cardiovascular;  Laterality: N/A;  . PATELLA FRACTURE SURGERY Right ~ 2006  . UMBILICAL HERNIA REPAIR  2000s    FAMILY HISTORY Family History  Problem Relation Age of Onset  . Hypertension Mother   . Hypertension Father   . Breast cancer Daughter     SOCIAL HISTORY Social History   Tobacco Use  . Smoking status: Former Smoker    Packs/day: 3.00    Years: 20.00    Pack years: 60.00    Types: Cigarettes    Quit date: 12/09/1972    Years since quitting: 46.5  . Smokeless tobacco: Never Used  Substance Use Topics  . Alcohol use: Yes    Alcohol/week: 1.0 standard drinks    Types: 1 Standard drinks or equivalent per week  . Drug use: Never         OPHTHALMIC EXAM:  Base Eye Exam    Visual Acuity (Snellen - Linear)      Right Left   Dist [place] 20/20 -2 20/70 -2   Dist ph [place]  NI       Tonometry (Tonopen, 8:12 AM)      Right Left   Pressure 13 12       Pupils      Dark Light Shape React APD   Right 4 3 Round Slow None   Left 4 3.5 Round Slow Trace       Visual Fields (Counting fingers)      Left Right   Restrictions Partial outer superior temporal, inferior temporal, superior nasal, inferior nasal deficiencies Partial outer inferior temporal, superior nasal, inferior nasal deficiencies       Extraocular Movement      Right Left    Full, Ortho Full, Ortho       Neuro/Psych     Oriented x3: Yes   Mood/Affect: Normal       Dilation    Both eyes: 1.0% Mydriacyl, 2.5% Phenylephrine @ 8:12 AM        Slit Lamp and Fundus Exam    Slit Lamp Exam  Right Left   Lids/Lashes Dermatochalasis - upper lid Dermatochalasis - upper lid   Conjunctiva/Sclera mild Melanosis mild Melanosis   Cornea Arcus, 1-2+ Punctate epithelial erosions, Well healed temporal cataract wounds Arcus, 2+ Punctate epithelial erosions, Well healed cataract wounds   Anterior Chamber deep and clear, cypass device at 0300 deep and clear, cypass device at 0800   Iris Round and dilated, No NVI Round and dilated, No NVI   Lens PC IOL in good position PC IOL in good position   Vitreous Vitreous syneresis Vitreous syneresis, vitreous condensations       Fundus Exam      Right Left   Disc +cupping, 3+pallor, +heme at 0700, temporal PPP +cupping, 3-4+pallor, non-existent inferior rim, temporal PPP   C/D Ratio 0.85 0.95   Macula Flat, good foveal reflex, improved edema, no heme improved foveal reflex, RPE mottling and clumping, Cystic changes - slightly improved, mild exudates temporal macula   Vessels Attenuated, Tortuous, venules dilated, AV crossing changes Attenuated, venules Dilated and Tortuous, scleratic vessels temporal periphery   Periphery attached, scattered RPE changes/peripheral drusen, mild IRH/DBH inferior hemishphere - improving Attached, scattered RPE changes, sectoral PRP scars superiorly, sclerotic vessels temporal periphery          IMAGING AND PROCEDURES  Imaging and Procedures for @TODAY @  OCT, Retina - OU - Both Eyes       Right Eye Quality was good. Central Foveal Thickness: 281. Progression has been stable. Findings include no IRF, no SRF, normal foveal contour, epiretinal membrane, macular pucker (Stable resolution of CME).   Left Eye Quality was good. Central Foveal Thickness: 250. Progression has improved. Findings include normal foveal contour, no SRF,  intraretinal fluid, outer retinal atrophy, epiretinal membrane, intraretinal hyper-reflective material, macular pucker ( Mild Interval improvement in IRF/IRHM).   Notes *Images captured and stored on drive  Diagnosis / Impression:  OD: Interval resolution of CME -- Stable resolution of CME OS: Interval improvement in IRF/IRHM   Clinical management:  See below  Abbreviations: NFP - Normal foveal profile. CME - cystoid macular edema. PED - pigment epithelial detachment. IRF - intraretinal fluid. SRF - subretinal fluid. EZ - ellipsoid zone. ERM - epiretinal membrane. ORA - outer retinal atrophy. ORT - outer retinal tubulation. SRHM - subretinal hyper-reflective material        Panretinal Photocoagulation - OS - Left Eye       LASER PROCEDURE NOTE  Diagnosis:   HRVO w/ peripheral capillary nonperfusion, LEFT EYE  Procedure:  Segmental pan-retinal photocoagulation using slit lamp laser, LEFT EYE, fill-in  Anesthesia:  Topical  Surgeon: [name], MD, PhD   Informed consent obtained, operative eye marked, and time out performed prior to initiation of laser.   Lumenis [id number] slit lamp laser Pattern: 3x3 square Power: 300 mW Duration: 30 msec  Spot size: 200 microns  # spots: 830 spots superior fill-in  Complications: None  RTC:  4 wks  Patient tolerated the procedure well and received written and verbal post-procedure care information/education.                ASSESSMENT/PLAN:    ICD-10-CM   1. Branch retinal vein occlusion of both eyes with macular edema  H34.8330 Panretinal Photocoagulation - OS - Left Eye  2. Retinal edema  H35.81 OCT, Retina - OU - Both Eyes  3. Primary open angle glaucoma (POAG) of both eyes, severe stage  H40.1133   4. Moderate nonproliferative diabetic retinopathy of both eyes with macular edema associated with type  2 diabetes mellitus ([place])  B35.3299 Panretinal Photocoagulation - OS - Left Eye  5. Essential hypertension  I10   6.  Hypertensive retinopathy of both eyes  H35.033   7. Pseudophakia of both eyes  Z96.1     1,2. HRVO w/ CME OU (OD > OS)  - OD--remote inferior HRVO w/ CME  - OS--remote superior HRVO w/ CME  - pt reports history of previous injections and segmental PRP laser at [facility] "years ago" -- able to obtain records  - last injection per pt history, Dr. Adrian Saran in 2015  - s/p superior segmental PRP OU  - s/p IVA OU #1 (03.16.20), #2 (05.22.20), #3 (06.26.20)  - BCVA stable at 20/25 OD, OS improved to 20/70 from 20/100+2  - OCT shows interval improvement in trace cystic changes OD, interval improvement in IRF OS  - FA (03.16.20) shows leakage patterns consistent with HRVO w/ CME bilaterally -- OS also with significant capillary nonperfusion temporally -- may benefit from fill in segmental PRP  - recommend segmental PRP fill in OS today, 07.02.20  - may be able to begin extension on right eye as it's back to baseline; OS likely to continue with injections  - RBA of procedure discussed, questions answered  - informed consent obtained and signed  - see procedure note  - F/U 4 weeks  3. POAG -- severe stage OU  - history of CEIOL w/ [place] OU by [name]  - now under the expert management of Dr. [name]  - IOP 13 OD, 12 OS  - currently on Cosopt BID OU  - history of brimonidine toxicity  4. Moderate nonproliferative diabetic retinopathy OU  - The incidence, risk factors for progression, natural history and treatment options for diabetic retinopathy were discussed with patient.    - The need for close monitoring of blood glucose, blood pressure, and serum lipids, avoiding cigarette or any type of tobacco, and the need for long term follow up was also discussed with patient.  - diabetic retinopathy confounded by HRVOs described above, but may be contributing to macular edema  - last A1c 6.6 on 11.13.19  - monitor  5,6. Hypertensive retinopathy OU  - discussed importance of tight BP  control  - monitor  7. Pseudophakia OU  - s/p CE/IOL w/ [place] OU - Dr. Jenene Slicker (2018)  - doing well  - monitor  Ophthalmic Meds Ordered this visit:  Meds ordered this encounter  Medications  . prednisoLONE acetate (PRED FORTE) 1 % ophthalmic suspension    Sig: 1 drop 4 times daily in the left eye for 7 days then stop    Dispense:  10 mL    Refill:  0       Return in about 4 weeks (around 07/08/2019) for Dilated Exam, OCT, Possible Injxn.  There are no Patient Instructions on file for this visit.   Explained the diagnoses, plan, and follow up with the patient and they expressed understanding.  Patient expressed understanding of the importance of proper follow up care.   This document serves as a record of services personally performed by Gardiner Sleeper, MD, PhD. It was created on their behalf by Ernest Mallick, OA, an ophthalmic assistant. The creation of this record is the provider's dictation and/or activities during the visit.    Electronically signed by: Ernest Mallick, OA  07.01.2020 10:11 PM    Gardiner Sleeper, M.D., Ph.D. Diseases & Surgery of the Retina and Vitreous [facility]  I have reviewed the above documentation for accuracy and completeness, and I agree with the above. Gardiner Sleeper, M.D., Ph.D. 06/10/19 10:11 PM   Abbreviations: M myopia (nearsighted); A astigmatism; H hyperopia (farsighted); P presbyopia; Mrx spectacle prescription;  CTL contact lenses; OD right eye; OS left eye; OU both eyes  XT exotropia; ET esotropia; PEK punctate epithelial keratitis; PEE punctate epithelial erosions; DES dry eye syndrome; MGD meibomian gland dysfunction; ATs artificial tears; PFAT's preservative free artificial tears; [place] nuclear sclerotic cataract; PSC posterior subcapsular cataract; ERM epi-retinal membrane; PVD posterior vitreous detachment; RD retinal detachment; DM diabetes mellitus; DR diabetic retinopathy; NPDR non-proliferative diabetic  retinopathy; PDR proliferative diabetic retinopathy; CSME clinically significant macular edema; DME diabetic macular edema; dbh dot blot hemorrhages; CWS cotton wool spot; POAG primary open angle glaucoma; C/D cup-to-disc ratio; HVF humphrey visual field; GVF goldmann visual field; OCT optical coherence tomography; IOP intraocular pressure; BRVO Branch retinal vein occlusion; CRVO central retinal vein occlusion; CRAO central retinal artery occlusion; BRAO branch retinal artery occlusion; RT retinal tear; SB scleral buckle; PPV pars plana vitrectomy; VH Vitreous hemorrhage; PRP panretinal laser photocoagulation; IVK intravitreal kenalog; VMT vitreomacular traction; MH Macular hole;  NVD neovascularization of the disc; NVE neovascularization elsewhere; AREDS age related eye disease study; ARMD age related macular degeneration; POAG primary open angle glaucoma; EBMD epithelial/anterior basement membrane dystrophy; ACIOL anterior chamber intraocular lens; IOL intraocular lens; PCIOL posterior chamber intraocular lens; Phaco/IOL phacoemulsification with intraocular lens placement; [place] photorefractive keratectomy; LASIK laser assisted in situ keratomileusis; HTN hypertension; DM diabetes mellitus; COPD chronic obstructive pulmonary disease

## 2019-06-10 ENCOUNTER — Encounter (INDEPENDENT_AMBULATORY_CARE_PROVIDER_SITE_OTHER): Payer: Self-pay | Admitting: Ophthalmology

## 2019-06-10 ENCOUNTER — Other Ambulatory Visit: Payer: Self-pay

## 2019-06-10 ENCOUNTER — Ambulatory Visit (INDEPENDENT_AMBULATORY_CARE_PROVIDER_SITE_OTHER): Payer: Medicare Other | Admitting: Ophthalmology

## 2019-06-10 DIAGNOSIS — H3581 Retinal edema: Secondary | ICD-10-CM

## 2019-06-10 DIAGNOSIS — I10 Essential (primary) hypertension: Secondary | ICD-10-CM

## 2019-06-10 DIAGNOSIS — Z961 Presence of intraocular lens: Secondary | ICD-10-CM

## 2019-06-10 DIAGNOSIS — E113313 Type 2 diabetes mellitus with moderate nonproliferative diabetic retinopathy with macular edema, bilateral: Secondary | ICD-10-CM | POA: Diagnosis not present

## 2019-06-10 DIAGNOSIS — H401133 Primary open-angle glaucoma, bilateral, severe stage: Secondary | ICD-10-CM | POA: Diagnosis not present

## 2019-06-10 DIAGNOSIS — H34833 Tributary (branch) retinal vein occlusion, bilateral, with macular edema: Secondary | ICD-10-CM | POA: Diagnosis not present

## 2019-06-10 DIAGNOSIS — H35033 Hypertensive retinopathy, bilateral: Secondary | ICD-10-CM

## 2019-06-10 MED ORDER — PREDNISOLONE ACETATE 1 % OP SUSP: OPHTHALMIC | 0 refills | Status: DC

## 2019-07-07 NOTE — Progress Notes (Addendum)
[facility] Note  07/09/2019     CHIEF COMPLAINT Patient presents for Retina Follow Up   HISTORY OF PRESENT ILLNESS: Chelsea Gallegos is a 85 y.o. female who presents to the clinic today for:   HPI    Retina Follow Up    Patient presents with  CRVO/BRVO.  In both eyes.  Severity is moderate.  Duration of 4 weeks.  Course: fluctuates.  I, the attending physician,  performed the HPI with the patient and updated documentation appropriately.          Comments    Patient states vision fluctuates OU. Patient is borderline diabetic, doesn't check BS. Only takes eliquis and baby ASA, but doesn't take medication every day. Still taking PF, but only twice daily. Also takes cosopt bid OU.       Last edited by [name], MD on 07/09/2019  8:07 AM. (History)      Referring physician: Benay Pike, MD [address],  [place] [address]  HISTORICAL INFORMATION:   Selected notes from the MEDICAL RECORD NUMBER Referred by Dr. [name] for concern of worsening CME OD LEE: 03.05.20 (C. Weaver) [BCVA: OD: 20/40 OS: 20/100] Ocular Hx-POAG, pseudo OU, CME OU, BRVO OU, follicular conjunctivitis OU, iritis OU, NPDR OU, scar on posterior pole OS, last visit with [name] (02.2015), received 15 Avastins/10 Lucentis, focal laser x2, PRP (02.22.12) PMH-DM (A1c: 6.6, takes metformin), HLD, asthma    CURRENT MEDICATIONS: Current Outpatient Medications (Ophthalmic Drugs)  Medication Sig  . dorzolamide-timolol (COSOPT) 22.3-6.8 MG/ML ophthalmic solution Place 1 drop into both eyes 2 (two) times daily.  . prednisoLONE acetate (PRED FORTE) 1 % ophthalmic suspension 1 drop 4 times daily in the left eye for 7 days then stop (Patient taking differently: Place 1 drop into both eyes. 1 drop 2 times daily in the left eye for 7 days then stop)  . dorzolamide (TRUSOPT) 2 % ophthalmic solution Place 1 drop into both eyes 2 (two) times daily.  . prednisoLONE  acetate (PRED FORTE) 1 % ophthalmic suspension INSTILL 1 DROP INTO EACH EYE TWICE DAILY   No current facility-administered medications for this visit.  (Ophthalmic Drugs)   Current Outpatient Medications (Other)  Medication Sig  . apixaban (ELIQUIS) 5 MG TABS tablet Take 1 tablet (5 mg total) by mouth 2 (two) times daily.  Marland Kitchen aspirin 81 MG chewable tablet Chew 162 mg by mouth once.  . digoxin (LANOXIN) 0.125 MG tablet Take 1 tablet (0.125 mg total) by mouth every Monday, Wednesday, and Friday.  . diltiazem (CARDIZEM) 60 MG tablet Take 1 tablet (60 mg total) by mouth 2 (two) times daily.  . potassium chloride (K-DUR,KLOR-CON) 10 MEQ tablet Take 1 tablet (10 mEq total) by mouth 2 (two) times daily.  Marland Kitchen atorvastatin (LIPITOR) 40 MG tablet Take 1 tablet (40 mg total) by mouth daily at 6 PM. (Patient not taking: Reported on 07/09/2019)  . metFORMIN (GLUCOPHAGE) 500 MG tablet Take 1 tablet (500 mg total) by mouth 2 (two) times daily with a meal. (Patient not taking: Reported on 07/09/2019)   No current facility-administered medications for this visit.  (Other)      REVIEW OF SYSTEMS: ROS    Positive for: Gastrointestinal, Endocrine, Cardiovascular, Eyes   Negative for: Constitutional, Neurological, Skin, Genitourinary, Musculoskeletal, HENT, Respiratory, Psychiatric, Allergic/Imm, Heme/Lymph   Last edited by Roselee Nova D on 07/09/2019  8:04 AM. (History)       ALLERGIES No Known  Allergies  PAST MEDICAL HISTORY Past Medical History:  Diagnosis Date  . Arthritis    "arms" (10/20/2018)  . Asthma   . Diabetic retinopathy ([place])    NPDR OU  . GERD (gastroesophageal reflux disease)   . Glaucoma    POAG OU  . History of gout   . History of hiatal hernia   . History of kidney stones   . Hyperlipidemia ~2005  . Hypertension   . Hypertensive retinopathy    OU  . Refusal of blood transfusions as patient is Jehovah's Witness   . Stroke [facility]) 04/2017   "dr told me I had a stroke in my  left eye" (10/20/2018)  . Type II diabetes mellitus ([place])    Past Surgical History:  Procedure Laterality Date  . CATARACT EXTRACTION Bilateral    [name]  . CATARACT EXTRACTION, BILATERAL Bilateral 2018  . CHOLECYSTECTOMY     pt does not recall this OR (10/20/2018)  . CYSTOSCOPY W/ STONE MANIPULATION    . EXCISIONAL HEMORRHOIDECTOMY  1980s/1990s   "& he cut my muscle that controls my bowels"  . EYE SURGERY    . FRACTURE SURGERY    . GLAUCOMA SURGERY Bilateral 2018  . HERNIA REPAIR    . KNEE ARTHROSCOPY Left 04/2012    WITH MEDIAL MENISECTOMY/notes 04/16/2012  . LEFT HEART CATH AND CORONARY ANGIOGRAPHY N/A 10/21/2018   Procedure: LEFT HEART CATH AND CORONARY ANGIOGRAPHY;  Surgeon: Dixie Dials, MD;  Location: [facility];  Service: Cardiovascular;  Laterality: N/A;  . PATELLA FRACTURE SURGERY Right ~ 2006  . UMBILICAL HERNIA REPAIR  2000s    FAMILY HISTORY Family History  Problem Relation Age of Onset  . Hypertension Mother   . Hypertension Father   . Breast cancer Daughter     SOCIAL HISTORY Social History   Tobacco Use  . Smoking status: Former Smoker    Packs/day: 3.00    Years: 20.00    Pack years: 60.00    Types: Cigarettes    Quit date: 12/09/1972    Years since quitting: 46.6  . Smokeless tobacco: Never Used  Substance Use Topics  . Alcohol use: Yes    Alcohol/week: 1.0 standard drinks    Types: 1 Standard drinks or equivalent per week  . Drug use: Never         OPHTHALMIC EXAM:  Base Eye Exam    Visual Acuity (Snellen - Linear)      Right Left   Dist [place] 20/25 -1 20/60 -1   Dist ph [place] 20/20 -2 NI       Tonometry (Tonopen, 8:14 AM)      Right Left   Pressure 18 13       Pupils      Dark Light Shape React APD   Right 4 3 Round Slow None   Left 4 3.5 Round Minimal Trace       Visual Fields      Left Right   Restrictions Partial outer superior temporal, inferior temporal, superior nasal, inferior nasal deficiencies Partial outer  superior temporal, inferior temporal, superior nasal deficiencies       Extraocular Movement      Right Left    Full, Ortho Full, Ortho       Neuro/Psych    Oriented x3: Yes   Mood/Affect: Normal       Dilation    Both eyes: 1.0% Mydriacyl, 2.5% Phenylephrine @ 8:14 AM        Slit  Lamp and Fundus Exam    Slit Lamp Exam      Right Left   Lids/Lashes Dermatochalasis - upper lid Dermatochalasis - upper lid   Conjunctiva/Sclera mild Melanosis mild Melanosis   Cornea Arcus, 1-2+ Punctate epithelial erosions, Well healed temporal cataract wounds Arcus, 2+ Punctate epithelial erosions, Well healed cataract wounds   Anterior Chamber deep and clear, cypass device at 0300 deep and clear, cypass device at 0800   Iris Round and dilated, No NVI Round and dilated, No NVI   Lens PC IOL in good position PC IOL in good position   Vitreous Vitreous syneresis Vitreous syneresis, vitreous condensations       Fundus Exam      Right Left   Disc +cupping, 3+pallor, +heme at 0700, temporal PPP +cupping, 3-4+pallor, non-existent inferior rim, temporal PPP   C/D Ratio 0.85 0.95   Macula Flat, good foveal reflex, improved edema, no heme improved foveal reflex, RPE mottling and clumping, Cystic changes - slightly improved, mild exudates temporal macula   Vessels Attenuated, Tortuous, venules dilated, AV crossing changes Attenuated, venules Dilated and Tortuous, scleratic vessels temporal periphery   Periphery attached, scattered RPE changes/peripheral drusen, mild IRH/DBH inferior hemishphere - improving Attached, scattered RPE changes, sectoral PRP scars superiorly w/ good fill in, sclerotic vessels temporal periphery          IMAGING AND PROCEDURES  Imaging and Procedures for @TODAY @  OCT, Retina - OU - Both Eyes       Right Eye Quality was good. Central Foveal Thickness: 276. Progression has been stable. Findings include no IRF, no SRF, normal foveal contour, epiretinal membrane, macular  pucker (Stable resolution of CME).   Left Eye Quality was good. Central Foveal Thickness: 302. Progression has worsened. Findings include normal foveal contour, no SRF, intraretinal fluid, outer retinal atrophy, epiretinal membrane, intraretinal hyper-reflective material, macular pucker (Interval increase in IRF).   Notes *Images captured and stored on drive  Diagnosis / Impression:  HRVO w/ CME OU OD: Stable resolution of CME OS: Interval increase in IRF   Clinical management:  See below  Abbreviations: NFP - Normal foveal profile. CME - cystoid macular edema. PED - pigment epithelial detachment. IRF - intraretinal fluid. SRF - subretinal fluid. EZ - ellipsoid zone. ERM - epiretinal membrane. ORA - outer retinal atrophy. ORT - outer retinal tubulation. SRHM - subretinal hyper-reflective material        Intravitreal Injection, Pharmacologic Agent - OS - Left Eye       Time Out 07/09/2019. 8:39 AM. Confirmed correct patient, procedure, site, and patient consented.   Anesthesia Topical anesthesia was used. Anesthetic medications included Lidocaine 2%, Proparacaine 0.5%.   Procedure Preparation included 5% betadine to ocular surface, eyelid speculum. A 30 gauge needle was used.   Injection:  1.25 mg Bevacizumab (AVASTIN) SOLN   NDC: 27782-423-53, Lot: [id number], Expiration date: 08/18/2019   Route: Intravitreal, Site: Left Eye, Waste: 0 mL  Post-op Post injection exam found visual acuity of at least counting fingers. The patient tolerated the procedure well. There were no complications. The patient received written and verbal post procedure care education.                 ASSESSMENT/PLAN:    ICD-10-CM   1. Branch retinal vein occlusion of both eyes with macular edema  H34.8330 Intravitreal Injection, Pharmacologic Agent - OS - Left Eye    Bevacizumab (AVASTIN) SOLN 1.25 mg    CANCELED: Intravitreal Injection, Pharmacologic Agent - OD - Right  Eye  2. Retinal edema   H35.81 OCT, Retina - OU - Both Eyes  3. Primary open angle glaucoma (POAG) of both eyes, severe stage  H40.1133   4. Moderate nonproliferative diabetic retinopathy of both eyes with macular edema associated with type 2 diabetes mellitus ([place])  J94.1740   5. Essential hypertension  I10   6. Hypertensive retinopathy of both eyes  H35.033   7. Pseudophakia of both eyes  Z96.1     1,2. HRVO w/ CME OU (OD > OS)  - OD--remote inferior HRVO w/ CME  - OS--remote superior HRVO w/ CME  - pt reports history of previous injections and segmental PRP laser at [facility] "years ago" -- able to obtain records  - last injection per pt history, Dr. Adrian Saran in 2015  - s/p superior segmental PRP OU  - s/p IVA OU #1 (03.16.20), #2 (05.22.20), #3 (06.26.20)  - s/p segmental fill in PRP OS (07.02.20)  - BCVA improved to 20/20 OD, OS improved to 20/70 from 20/100+2  - OCT shows stable resolution of IRF/ystic changes OD, interval improvement in IRF OS  - FA (03.16.20) shows leakage patterns consistent with HRVO w/ CME bilaterally -- OS also with significant capillary nonperfusion temporally -- may benefit from fill in segmental PRP  - recommend IVA OS #4 today, 07.31.20 (do not recommend IVA OD as CME stably improved s/p IVA x3 OD)  - pt wishes to proceed  - F/U 4 weeks -- DFE/OCT/possible injection(s) ?repeat FA transit OD?  3. POAG -- severe stage OU  - history of CEIOL w/ [place] OU by [name]  - now under the expert management of Dr. [name]  - IOP 18 OD, 13 OS  - currently on Cosopt BID OU  - history of brimonidine toxicity  4. Moderate nonproliferative diabetic retinopathy OU  - The incidence, risk factors for progression, natural history and treatment options for diabetic retinopathy were discussed with patient.    - The need for close monitoring of blood glucose, blood pressure, and serum lipids, avoiding cigarette or any type of tobacco, and the need for long term follow up was also  discussed with patient.  - diabetic retinopathy confounded by HRVOs described above, but may be contributing to macular edema  - last A1c 6.6 on 11.13.19  - monitor  5,6. Hypertensive retinopathy OU  - discussed importance of tight BP control  - monitor  7. Pseudophakia OU  - s/p CE/IOL w/ [place] OU - Dr. Jenene Slicker (2018)  - doing well  - monitor  Ophthalmic Meds Ordered this visit:  Meds ordered this encounter  Medications  . Bevacizumab (AVASTIN) SOLN 1.25 mg       Return in about 4 weeks (around 08/06/2019) for f/u HRVO w/ CME OU - Dilated Exam, OCT, Possible Injxn.  There are no Patient Instructions on file for this visit.   Explained the diagnoses, plan, and follow up with the patient and they expressed understanding.  Patient expressed understanding of the importance of proper follow up care.   This document serves as a record of services personally performed by Gardiner Sleeper, MD, PhD. It was created on their behalf by Ernest Mallick, OA, an ophthalmic assistant. The creation of this record is the provider's dictation and/or activities during the visit.    Electronically signed by: Ernest Mallick, OA 07.29.2020 1:18 AM    Gardiner Sleeper, M.D., Ph.D. Diseases & Surgery of the Retina and [place]  I have reviewed the above documentation for accuracy and completeness, and I agree with the above. Gardiner Sleeper, M.D., Ph.D. 07/10/19 1:18 AM    Abbreviations: M myopia (nearsighted); A astigmatism; H hyperopia (farsighted); P presbyopia; Mrx spectacle prescription;  CTL contact lenses; OD right eye; OS left eye; OU both eyes  XT exotropia; ET esotropia; PEK punctate epithelial keratitis; PEE punctate epithelial erosions; DES dry eye syndrome; MGD meibomian gland dysfunction; ATs artificial tears; PFAT's preservative free artificial tears; [place] nuclear sclerotic cataract; PSC posterior subcapsular cataract; ERM epi-retinal membrane; PVD  posterior vitreous detachment; RD retinal detachment; DM diabetes mellitus; DR diabetic retinopathy; NPDR non-proliferative diabetic retinopathy; PDR proliferative diabetic retinopathy; CSME clinically significant macular edema; DME diabetic macular edema; dbh dot blot hemorrhages; CWS cotton wool spot; POAG primary open angle glaucoma; C/D cup-to-disc ratio; HVF humphrey visual field; GVF goldmann visual field; OCT optical coherence tomography; IOP intraocular pressure; BRVO Branch retinal vein occlusion; CRVO central retinal vein occlusion; CRAO central retinal artery occlusion; BRAO branch retinal artery occlusion; RT retinal tear; SB scleral buckle; PPV pars plana vitrectomy; VH Vitreous hemorrhage; PRP panretinal laser photocoagulation; IVK intravitreal kenalog; VMT vitreomacular traction; MH Macular hole;  NVD neovascularization of the disc; NVE neovascularization elsewhere; AREDS age related eye disease study; ARMD age related macular degeneration; POAG primary open angle glaucoma; EBMD epithelial/anterior basement membrane dystrophy; ACIOL anterior chamber intraocular lens; IOL intraocular lens; PCIOL posterior chamber intraocular lens; Phaco/IOL phacoemulsification with intraocular lens placement; [place] photorefractive keratectomy; LASIK laser assisted in situ keratomileusis; HTN hypertension; DM diabetes mellitus; COPD chronic obstructive pulmonary disease

## 2019-07-09 ENCOUNTER — Ambulatory Visit (INDEPENDENT_AMBULATORY_CARE_PROVIDER_SITE_OTHER): Payer: Medicare Other | Admitting: Ophthalmology

## 2019-07-09 ENCOUNTER — Encounter (INDEPENDENT_AMBULATORY_CARE_PROVIDER_SITE_OTHER): Payer: Self-pay | Admitting: Ophthalmology

## 2019-07-09 ENCOUNTER — Other Ambulatory Visit: Payer: Self-pay

## 2019-07-09 DIAGNOSIS — H35033 Hypertensive retinopathy, bilateral: Secondary | ICD-10-CM

## 2019-07-09 DIAGNOSIS — H34833 Tributary (branch) retinal vein occlusion, bilateral, with macular edema: Secondary | ICD-10-CM

## 2019-07-09 DIAGNOSIS — H401133 Primary open-angle glaucoma, bilateral, severe stage: Secondary | ICD-10-CM

## 2019-07-09 DIAGNOSIS — H3581 Retinal edema: Secondary | ICD-10-CM | POA: Diagnosis not present

## 2019-07-09 DIAGNOSIS — I10 Essential (primary) hypertension: Secondary | ICD-10-CM

## 2019-07-09 DIAGNOSIS — Z961 Presence of intraocular lens: Secondary | ICD-10-CM

## 2019-07-09 DIAGNOSIS — E113313 Type 2 diabetes mellitus with moderate nonproliferative diabetic retinopathy with macular edema, bilateral: Secondary | ICD-10-CM

## 2019-07-09 MED ORDER — BEVACIZUMAB CHEMO INJECTION 1.25MG/0.05ML SYRINGE FOR KALEIDOSCOPE
1.2500 mg | INTRAVITREAL | Status: AC | PRN
  Administered 2019-07-09: 09:00:00 1.25 mg via INTRAVITREAL

## 2019-08-05 NOTE — Progress Notes (Signed)
[facility] Note  08/06/2019     CHIEF COMPLAINT Patient presents for Retina Follow Up   HISTORY OF PRESENT ILLNESS: Chelsea Gallegos is a 85 y.o. female who presents to the clinic today for:   HPI    Retina Follow Up    Patient presents with  Other.  In both eyes.  This started weeks ago.  Severity is moderate.  Duration of weeks.  I, the attending physician,  performed the HPI with the patient and updated documentation appropriately.          Comments    Patient states her vision is about the same.  She denies eye pain or discomfort and denies any new or worsening floaters or fol OU.       Last edited by [name], MD on 08/06/2019  2:53 PM. (History)      Referring physician: Benay Pike, MD [address],  [place] [address]  HISTORICAL INFORMATION:   Selected notes from the MEDICAL RECORD NUMBER Referred by Dr. [name] for concern of worsening CME OD LEE: 03.05.20 (C. Weaver) [BCVA: OD: 20/40 OS: 20/100] Ocular Hx-POAG, pseudo OU, CME OU, BRVO OU, follicular conjunctivitis OU, iritis OU, NPDR OU, scar on posterior pole OS, last visit with [name] (02.2015), received 15 Avastins/10 Lucentis, focal laser x2, PRP (02.22.12) PMH-DM (A1c: 6.6, takes metformin), HLD, asthma    CURRENT MEDICATIONS: Current Outpatient Medications (Ophthalmic Drugs)  Medication Sig  . brimonidine (ALPHAGAN) 0.2 % ophthalmic solution Place 1 drop into both eyes 2 (two) times daily.  . dorzolamide (TRUSOPT) 2 % ophthalmic solution Place 1 drop into both eyes 2 (two) times daily.  . dorzolamide-timolol (COSOPT) 22.3-6.8 MG/ML ophthalmic solution Place 1 drop into both eyes 2 (two) times daily.  . prednisoLONE acetate (PRED FORTE) 1 % ophthalmic suspension INSTILL 1 DROP INTO EACH EYE TWICE DAILY  . prednisoLONE acetate (PRED FORTE) 1 % ophthalmic suspension 1 drop 4 times daily in the left eye for 7 days then stop (Patient taking  differently: Place 1 drop into both eyes. 1 drop 2 times daily in the left eye for 7 days then stop)   No current facility-administered medications for this visit.  (Ophthalmic Drugs)   Current Outpatient Medications (Other)  Medication Sig  . apixaban (ELIQUIS) 5 MG TABS tablet Take 1 tablet (5 mg total) by mouth 2 (two) times daily.  Marland Kitchen aspirin 81 MG chewable tablet Chew 162 mg by mouth once.  Marland Kitchen atorvastatin (LIPITOR) 40 MG tablet Take 1 tablet (40 mg total) by mouth daily at 6 PM. (Patient not taking: Reported on 07/09/2019)  . digoxin (LANOXIN) 0.125 MG tablet Take 1 tablet (0.125 mg total) by mouth every Monday, Wednesday, and Friday.  . diltiazem (CARDIZEM) 60 MG tablet Take 1 tablet (60 mg total) by mouth 2 (two) times daily.  . metFORMIN (GLUCOPHAGE) 500 MG tablet Take 1 tablet (500 mg total) by mouth 2 (two) times daily with a meal. (Patient not taking: Reported on 07/09/2019)  . potassium chloride (K-DUR,KLOR-CON) 10 MEQ tablet Take 1 tablet (10 mEq total) by mouth 2 (two) times daily.   No current facility-administered medications for this visit.  (Other)      REVIEW OF SYSTEMS: ROS    Positive for: Gastrointestinal, Endocrine, Cardiovascular, Eyes   Negative for: Constitutional, Neurological, Skin, Genitourinary, Musculoskeletal, HENT, Respiratory, Psychiatric, Allergic/Imm, Heme/Lymph   Last edited by [name] on 08/06/2019  1:22 PM. (History)  ALLERGIES No Known Allergies  PAST MEDICAL HISTORY Past Medical History:  Diagnosis Date  . Arthritis    "arms" (10/20/2018)  . Asthma   . Diabetic retinopathy ([place])    NPDR OU  . GERD (gastroesophageal reflux disease)   . Glaucoma    POAG OU  . History of gout   . History of hiatal hernia   . History of kidney stones   . Hyperlipidemia ~2005  . Hypertension   . Hypertensive retinopathy    OU  . Refusal of blood transfusions as patient is Jehovah's Witness   . Stroke [facility]) 04/2017   "dr told me I had a  stroke in my left eye" (10/20/2018)  . Type II diabetes mellitus ([place])    Past Surgical History:  Procedure Laterality Date  . CATARACT EXTRACTION Bilateral    [name]  . CATARACT EXTRACTION, BILATERAL Bilateral 2018  . CHOLECYSTECTOMY     pt does not recall this OR (10/20/2018)  . CYSTOSCOPY W/ STONE MANIPULATION    . EXCISIONAL HEMORRHOIDECTOMY  1980s/1990s   "& he cut my muscle that controls my bowels"  . EYE SURGERY    . FRACTURE SURGERY    . GLAUCOMA SURGERY Bilateral 2018  . HERNIA REPAIR    . KNEE ARTHROSCOPY Left 04/2012    WITH MEDIAL MENISECTOMY/notes 04/16/2012  . LEFT HEART CATH AND CORONARY ANGIOGRAPHY N/A 10/21/2018   Procedure: LEFT HEART CATH AND CORONARY ANGIOGRAPHY;  Surgeon: Dixie Dials, MD;  Location: [place] [facility];  Service: Cardiovascular;  Laterality: N/A;  . PATELLA FRACTURE SURGERY Right ~ 2006  . UMBILICAL HERNIA REPAIR  2000s    FAMILY HISTORY Family History  Problem Relation Age of Onset  . Hypertension Mother   . Hypertension Father   . Breast cancer Daughter     SOCIAL HISTORY Social History   Tobacco Use  . Smoking status: Former Smoker    Packs/day: 3.00    Years: 20.00    Pack years: 60.00    Types: Cigarettes    Quit date: 12/09/1972    Years since quitting: 46.6  . Smokeless tobacco: Never Used  Substance Use Topics  . Alcohol use: Yes    Alcohol/week: 1.0 standard drinks    Types: 1 Standard drinks or equivalent per week  . Drug use: Never         OPHTHALMIC EXAM:  Base Eye Exam    Visual Acuity (Snellen - Linear)      Right Left   Dist [place] 20/50 -2 20/70 -1   Dist ph [place] NI 20/60       Tonometry (Tonopen, 1:31 PM)      Right Left   Pressure 15 20       Pupils      Dark Light Shape React APD   Right 4 3 Round Minimal 0   Left 4 3 Round Minimal trace?       Visual Fields      Left Right   Restrictions Partial outer superior temporal, inferior temporal, superior nasal, inferior nasal deficiencies Partial  outer superior temporal, inferior temporal, superior nasal deficiencies       Extraocular Movement      Right Left    Full Full       Neuro/Psych    Oriented x3: Yes   Mood/Affect: Normal       Dilation    Both eyes: 1.0% Mydriacyl, 2.5% Phenylephrine @ 1:31 PM        Slit  Lamp and Fundus Exam    Slit Lamp Exam      Right Left   Lids/Lashes Dermatochalasis - upper lid Dermatochalasis - upper lid   Conjunctiva/Sclera mild Melanosis mild Melanosis   Cornea Arcus, 1-2+ Punctate epithelial erosions, Well healed temporal cataract wounds Arcus, 2+ Punctate epithelial erosions, Well healed cataract wounds   Anterior Chamber deep and clear, cypass device at 0300 deep and clear, cypass device at 0800   Iris Round and dilated, No NVI Round and dilated, No NVI   Lens PC IOL in good position PC IOL in good position   Vitreous Vitreous syneresis Vitreous syneresis, vitreous condensations       Fundus Exam      Right Left   Disc +cupping, 3-4-+pallor, +heme at 0700, temporal PPP +cupping, 3-4+pallor, non-existent inferior rim, temporal PPP   C/D Ratio 0.85 0.95   Macula Flat, recurrent edema, scattered MA superior macula no heme improved foveal reflex, RPE mottling and clumping, Cystic changes - slightly improved, mild exudates temporal macula, mild ERM, good segmental PRP fill in   Vessels Attenuated, Tortuous, AV crossing changes Attenuated, venules Dilated and Tortuous, scleratic vessels temporal periphery   Periphery attached, scattered RPE changes/peripheral drusen, mild IRH/DBH inferior hemishphere - improving Attached, scattered RPE changes, sectoral PRP scars superiorly w/ good fill in, sclerotic vessels temporal periphery        Refraction    Wearing Rx      Sphere   Right None   Left None       Manifest Refraction      Sphere Cylinder Axis Dist VA   Right -0.75 +0.50 180 20/25-2   Left +0.00 +1.50 180 20/60          IMAGING AND PROCEDURES  Imaging and Procedures  for @TODAY @  OCT, Retina - OU - Both Eyes       Right Eye Quality was good. Central Foveal Thickness: 380. Progression has worsened. Findings include no SRF, epiretinal membrane, macular pucker, abnormal foveal contour, intraretinal fluid (Interval recurrence of IRF/CME).   Left Eye Quality was good. Central Foveal Thickness: 279. Progression has improved. Findings include normal foveal contour, no SRF, intraretinal fluid, outer retinal atrophy, epiretinal membrane, intraretinal hyper-reflective material, macular pucker (Interval improvement in IRF).   Notes *Images captured and stored on drive  Diagnosis / Impression:  HRVO w/ CME OU OD: Interval recurrence of IRF/CME OS: Interval improvement in IRF   Clinical management:  See below  Abbreviations: NFP - Normal foveal profile. CME - cystoid macular edema. PED - pigment epithelial detachment. IRF - intraretinal fluid. SRF - subretinal fluid. EZ - ellipsoid zone. ERM - epiretinal membrane. ORA - outer retinal atrophy. ORT - outer retinal tubulation. SRHM - subretinal hyper-reflective material        Intravitreal Injection, Pharmacologic Agent - OS - Left Eye       Time Out 08/06/2019. 1:13 PM. Confirmed correct patient, procedure, site, and patient consented.   Anesthesia Topical anesthesia was used. Anesthetic medications included Lidocaine 2%, Proparacaine 0.5%.   Procedure Preparation included 5% betadine to ocular surface, eyelid speculum. A supplied needle was used.   Injection:  1.25 mg Bevacizumab (AVASTIN) SOLN   NDC: [id number], Lot: [id number] , Expiration date: 09/22/2019   Route: Intravitreal, Site: Left Eye, Waste: 0 mL  Post-op Post injection exam found visual acuity of at least counting fingers. The patient tolerated the procedure well. There were no complications. The patient received written and verbal post procedure care education.  Notes An AC tap was performed following injection due to elevated  IOP using a 30 gauge needle on a syringe with the plunger removed. The needle was placed at the limbus at 5 oclock and approximately 0.08 cc of aqueous was removed from the anterior chamber. Betadine was applied to the tap area before and after the paracentesis was performed. There were no complications. The patient tolerated the procedure well. The IOP was rechecked and was found to be 7 mmHg by tonopen.        Intravitreal Injection, Pharmacologic Agent - OD - Right Eye       Time Out 08/06/2019. 2:59 PM. Confirmed correct patient, procedure, site, and patient consented.   Anesthesia Topical anesthesia was used. Anesthetic medications included Lidocaine 2%, Proparacaine 0.5%.   Procedure Preparation included 5% betadine to ocular surface, eyelid speculum. A supplied needle was used.   Injection:  1.25 mg Bevacizumab (AVASTIN) SOLN   NDC: [id number], Lot: [id number] , Expiration date: 09/22/2019   Route: Intravitreal, Site: Right Eye, Waste: 0 mL  Post-op Post injection exam found visual acuity of at least counting fingers. The patient tolerated the procedure well. There were no complications. The patient received written and verbal post procedure care education.   Notes An AC tap was performed following injection due to elevated IOP using a 30 gauge needle on a syringe with the plunger removed. The needle was placed at the limbus at 7 oclock and approximately 0.08cc of aqueous was removed from the anterior chamber. Betadine was applied to the tap area before and after the paracentesis was performed. There were no complications. The patient tolerated the procedure well. The IOP was rechecked and was found to be 8 mmHg by tonopen.                 ASSESSMENT/PLAN:    ICD-10-CM   1. Branch retinal vein occlusion of both eyes with macular edema  H34.8330 Intravitreal Injection, Pharmacologic Agent - OS - Left Eye    Intravitreal Injection, Pharmacologic Agent - OD - Right Eye     Bevacizumab (AVASTIN) SOLN 1.25 mg    Bevacizumab (AVASTIN) SOLN 1.25 mg  2. Retinal edema  H35.81 OCT, Retina - OU - Both Eyes  3. Primary open angle glaucoma (POAG) of both eyes, severe stage  H40.1133   4. Moderate nonproliferative diabetic retinopathy of both eyes with macular edema associated with type 2 diabetes mellitus ([place])  [address]   5. Essential hypertension  I10   6. Hypertensive retinopathy of both eyes  H35.033   7. Pseudophakia of both eyes  Z96.1     1,2. HRVO w/ CME OU (OD > OS)  - OD--remote inferior HRVO w/ CME  - OS--remote superior HRVO w/ CME  - pt reports history of previous injections and segmental PRP laser at [facility] "years ago" -- able to obtain records  - last injection per pt history, Dr. [facility] in 2015  - s/p superior segmental PRP OU by [facility]  - FA (03.16.20) shows leakage patterns consistent with HRVO w/ CME bilaterally -- OS also with significant capillary nonperfusion temporally -- may benefit from fill in segmental   - s/p IVA OU #1 (03.16.20), #2 (05.22.20), #3 (06.26.20)  - s/p IVA OS #4 (07.31.20)  - s/p segmental fill in PRP OS (07.02.20)  - BCVA decreased to 20/50 from 20/20 OD, OS stable at 20/60  - OCT shows interval recurrence of IRF/CME OD, interval improvement in IRF OS PRP  - recommend IVA  OS #5 and OD #4 today, 08.28.20   - pt wishes to proceed  - F/U 4 weeks -- DFE/OCT/possible injection(s)  3. POAG -- severe stage OU  - history of CEIOL w/ [place] OU by [name]  - now under the expert management of Dr. [name]  - IOP 15 OD, 20 OS -- above goal  - currently on Cosopt BID OU  - history of brimonidine toxicity  4. Moderate nonproliferative diabetic retinopathy OU  - The incidence, risk factors for progression, natural history and treatment options for diabetic retinopathy were discussed with patient.    - The need for close monitoring of blood glucose, blood pressure, and serum lipids, avoiding  cigarette or any type of tobacco, and the need for long term follow up was also discussed with patient.  - diabetic retinopathy confounded by HRVOs described above, but may be contributing to macular edema  - last A1c 6.6 on 11.13.19  - monitor  5,6. Hypertensive retinopathy OU  - discussed importance of tight BP control  - monitor  7. Pseudophakia OU  - s/p CE/IOL w/ [place] OU - Dr. Jenene Slicker (2018)  - doing well  - monitor  Ophthalmic Meds Ordered this visit:  Meds ordered this encounter  Medications  . brimonidine (ALPHAGAN) 0.2 % ophthalmic solution    Sig: Place 1 drop into both eyes 2 (two) times daily.    Dispense:  10 mL    Refill:  6  . Bevacizumab (AVASTIN) SOLN 1.25 mg  . Bevacizumab (AVASTIN) SOLN 1.25 mg       Return in about 4 weeks (around 09/03/2019) for f/u HRVO OU - Dilated Exam, OCT, Possible Injxn.  There are no Patient Instructions on file for this visit.   Explained the diagnoses, plan, and follow up with the patient and they expressed understanding.  Patient expressed understanding of the importance of proper follow up care.   This document serves as a record of services personally performed by Gardiner Sleeper, MD, PhD. It was created on their behalf by Ernest Mallick, OA, an ophthalmic assistant. The creation of this record is the provider's dictation and/or activities during the visit.    Electronically signed by: Ernest Mallick, OA 08.27.2020 4:00 PM      Gardiner Sleeper, M.D., Ph.D. Diseases & Surgery of the Retina and Vitreous [facility]  I have reviewed the above documentation for accuracy and completeness, and I agree with the above. Gardiner Sleeper, M.D., Ph.D. 08/06/19 4:00 PM   Abbreviations: M myopia (nearsighted); A astigmatism; H hyperopia (farsighted); P presbyopia; Mrx spectacle prescription;  CTL contact lenses; OD right eye; OS left eye; OU both eyes  XT exotropia; ET esotropia; PEK punctate epithelial  keratitis; PEE punctate epithelial erosions; DES dry eye syndrome; MGD meibomian gland dysfunction; ATs artificial tears; PFAT's preservative free artificial tears; [place] nuclear sclerotic cataract; PSC posterior subcapsular cataract; ERM epi-retinal membrane; PVD posterior vitreous detachment; RD retinal detachment; DM diabetes mellitus; DR diabetic retinopathy; NPDR non-proliferative diabetic retinopathy; PDR proliferative diabetic retinopathy; CSME clinically significant macular edema; DME diabetic macular edema; dbh dot blot hemorrhages; CWS cotton wool spot; POAG primary open angle glaucoma; C/D cup-to-disc ratio; HVF humphrey visual field; GVF goldmann visual field; OCT optical coherence tomography; IOP intraocular pressure; BRVO Branch retinal vein occlusion; CRVO central retinal vein occlusion; CRAO central retinal artery occlusion; BRAO branch retinal artery occlusion; RT retinal tear; SB scleral buckle; PPV pars plana vitrectomy; VH Vitreous hemorrhage; PRP panretinal laser  photocoagulation; IVK intravitreal kenalog; VMT vitreomacular traction; MH Macular hole;  NVD neovascularization of the disc; NVE neovascularization elsewhere; AREDS age related eye disease study; ARMD age related macular degeneration; POAG primary open angle glaucoma; EBMD epithelial/anterior basement membrane dystrophy; ACIOL anterior chamber intraocular lens; IOL intraocular lens; PCIOL posterior chamber intraocular lens; Phaco/IOL phacoemulsification with intraocular lens placement; [place] photorefractive keratectomy; LASIK laser assisted in situ keratomileusis; HTN hypertension; DM diabetes mellitus; COPD chronic obstructive pulmonary disease

## 2019-08-06 ENCOUNTER — Ambulatory Visit (INDEPENDENT_AMBULATORY_CARE_PROVIDER_SITE_OTHER): Payer: Medicare Other | Admitting: Ophthalmology

## 2019-08-06 ENCOUNTER — Encounter (INDEPENDENT_AMBULATORY_CARE_PROVIDER_SITE_OTHER): Payer: Self-pay | Admitting: Ophthalmology

## 2019-08-06 ENCOUNTER — Other Ambulatory Visit: Payer: Self-pay

## 2019-08-06 ENCOUNTER — Encounter (INDEPENDENT_AMBULATORY_CARE_PROVIDER_SITE_OTHER): Payer: Medicare Other | Admitting: Ophthalmology

## 2019-08-06 DIAGNOSIS — H401133 Primary open-angle glaucoma, bilateral, severe stage: Secondary | ICD-10-CM

## 2019-08-06 DIAGNOSIS — Z961 Presence of intraocular lens: Secondary | ICD-10-CM

## 2019-08-06 DIAGNOSIS — H34833 Tributary (branch) retinal vein occlusion, bilateral, with macular edema: Secondary | ICD-10-CM

## 2019-08-06 DIAGNOSIS — H3581 Retinal edema: Secondary | ICD-10-CM

## 2019-08-06 DIAGNOSIS — E113313 Type 2 diabetes mellitus with moderate nonproliferative diabetic retinopathy with macular edema, bilateral: Secondary | ICD-10-CM

## 2019-08-06 DIAGNOSIS — I10 Essential (primary) hypertension: Secondary | ICD-10-CM

## 2019-08-06 DIAGNOSIS — H35033 Hypertensive retinopathy, bilateral: Secondary | ICD-10-CM

## 2019-08-06 MED ORDER — BRIMONIDINE TARTRATE 0.2 % OP SOLN: 1.0000 [drp] | Freq: Two times a day (BID) | OPHTHALMIC | 6 refills | Status: AC

## 2019-08-06 MED ORDER — BEVACIZUMAB CHEMO INJECTION 1.25MG/0.05ML SYRINGE FOR KALEIDOSCOPE
1.2500 mg | INTRAVITREAL | Status: AC | PRN
  Administered 2019-08-06: 1.25 mg via INTRAVITREAL

## 2019-08-18 ENCOUNTER — Encounter (INDEPENDENT_AMBULATORY_CARE_PROVIDER_SITE_OTHER): Payer: Self-pay | Admitting: Ophthalmology

## 2019-08-18 ENCOUNTER — Other Ambulatory Visit: Payer: Self-pay

## 2019-08-18 ENCOUNTER — Ambulatory Visit (INDEPENDENT_AMBULATORY_CARE_PROVIDER_SITE_OTHER): Payer: Medicare Other | Admitting: Ophthalmology

## 2019-08-18 DIAGNOSIS — H401133 Primary open-angle glaucoma, bilateral, severe stage: Secondary | ICD-10-CM | POA: Diagnosis not present

## 2019-08-18 DIAGNOSIS — H34833 Tributary (branch) retinal vein occlusion, bilateral, with macular edema: Secondary | ICD-10-CM | POA: Diagnosis not present

## 2019-08-18 DIAGNOSIS — H1013 Acute atopic conjunctivitis, bilateral: Secondary | ICD-10-CM

## 2019-08-18 DIAGNOSIS — H3581 Retinal edema: Secondary | ICD-10-CM | POA: Diagnosis not present

## 2019-08-18 DIAGNOSIS — H35033 Hypertensive retinopathy, bilateral: Secondary | ICD-10-CM

## 2019-08-18 DIAGNOSIS — Z961 Presence of intraocular lens: Secondary | ICD-10-CM

## 2019-08-18 DIAGNOSIS — E113313 Type 2 diabetes mellitus with moderate nonproliferative diabetic retinopathy with macular edema, bilateral: Secondary | ICD-10-CM

## 2019-08-18 DIAGNOSIS — I10 Essential (primary) hypertension: Secondary | ICD-10-CM

## 2019-08-18 NOTE — Progress Notes (Signed)
[facility] Note  08/18/2019     CHIEF COMPLAINT Patient presents for Eye Problem   HISTORY OF PRESENT ILLNESS: Chelsea Gallegos is a 85 y.o. female who presents to the clinic today for:   HPI    85 y/o female pt s/p IVA OU on 08.28.20.  Pt reports that ever since last injections, both of her eyes have been very red and sore (pain level consistently at about a 7), she has been extremely photophobic and VA OU has been very blurred.  Symptoms have been constant since last injections.  Pt used AT to sooth, but it didn't help alleviate symptoms.  Denies flashes, floaters.  Using Cosopt and Brimonidine BID OU.   Last edited by Matthew Folks, COA on 08/18/2019 10:59 AM. (History)    pt states her eyes have been swollen and red since last visit  Referring physician: Benay Pike, MD [address],  [place] [address]  HISTORICAL INFORMATION:   Selected notes from the MEDICAL RECORD NUMBER Referred by Dr. [name] for concern of worsening CME OD LEE: 03.05.20 (C. Weaver) [BCVA: OD: 20/40 OS: 20/100] Ocular Hx-POAG, pseudo OU, CME OU, BRVO OU, follicular conjunctivitis OU, iritis OU, NPDR OU, scar on posterior pole OS, last visit with [name] (02.2015), received 15 Avastins/10 Lucentis, focal laser x2, PRP (02.22.12) PMH-DM (A1c: 6.6, takes metformin), HLD, asthma    CURRENT MEDICATIONS: Current Outpatient Medications (Ophthalmic Drugs)  Medication Sig  . brimonidine (ALPHAGAN) 0.2 % ophthalmic solution Place 1 drop into both eyes 2 (two) times daily.  . dorzolamide (TRUSOPT) 2 % ophthalmic solution Place 1 drop into both eyes 2 (two) times daily.  . dorzolamide-timolol (COSOPT) 22.3-6.8 MG/ML ophthalmic solution Place 1 drop into both eyes 2 (two) times daily.  . prednisoLONE acetate (PRED FORTE) 1 % ophthalmic suspension INSTILL 1 DROP INTO EACH EYE TWICE DAILY  . prednisoLONE acetate (PRED FORTE) 1 % ophthalmic suspension 1 drop 4  times daily in the left eye for 7 days then stop (Patient taking differently: Place 1 drop into both eyes. 1 drop 2 times daily in the left eye for 7 days then stop)   No current facility-administered medications for this visit.  (Ophthalmic Drugs)   Current Outpatient Medications (Other)  Medication Sig  . apixaban (ELIQUIS) 5 MG TABS tablet Take 1 tablet (5 mg total) by mouth 2 (two) times daily.  Marland Kitchen aspirin 81 MG chewable tablet Chew 162 mg by mouth once.  Marland Kitchen atorvastatin (LIPITOR) 40 MG tablet Take 1 tablet (40 mg total) by mouth daily at 6 PM. (Patient not taking: Reported on 07/09/2019)  . digoxin (LANOXIN) 0.125 MG tablet Take 1 tablet (0.125 mg total) by mouth every Monday, Wednesday, and Friday.  . diltiazem (CARDIZEM) 60 MG tablet Take 1 tablet (60 mg total) by mouth 2 (two) times daily.  . metFORMIN (GLUCOPHAGE) 500 MG tablet Take 1 tablet (500 mg total) by mouth 2 (two) times daily with a meal. (Patient not taking: Reported on 07/09/2019)  . potassium chloride (K-DUR,KLOR-CON) 10 MEQ tablet Take 1 tablet (10 mEq total) by mouth 2 (two) times daily.   No current facility-administered medications for this visit.  (Other)      REVIEW OF SYSTEMS: ROS    Positive for: Gastrointestinal, Endocrine, Eyes   Negative for: Constitutional, Neurological, Skin, Genitourinary, Musculoskeletal, HENT, Cardiovascular, Respiratory, Psychiatric, Allergic/Imm, Heme/Lymph   Last edited by Matthew Folks, COA on 08/18/2019 10:59 AM. (History)  ALLERGIES No Known Allergies  PAST MEDICAL HISTORY Past Medical History:  Diagnosis Date  . Arthritis    "arms" (10/20/2018)  . Asthma   . Diabetic retinopathy ([place])    NPDR OU  . GERD (gastroesophageal reflux disease)   . Glaucoma    POAG OU  . History of gout   . History of hiatal hernia   . History of kidney stones   . Hyperlipidemia ~2005  . Hypertension   . Hypertensive retinopathy    OU  . Refusal of blood transfusions as patient is  [employer]   . Stroke [facility]) 04/2017   "dr told me I had a stroke in my left eye" (10/20/2018)  . Type II diabetes mellitus ([place])    Past Surgical History:  Procedure Laterality Date  . CATARACT EXTRACTION Bilateral    [name]  . CATARACT EXTRACTION, BILATERAL Bilateral 2018  . CHOLECYSTECTOMY     pt does not recall this OR (10/20/2018)  . CYSTOSCOPY W/ STONE MANIPULATION    . EXCISIONAL HEMORRHOIDECTOMY  1980s/1990s   "& he cut my muscle that controls my bowels"  . EYE SURGERY    . FRACTURE SURGERY    . GLAUCOMA SURGERY Bilateral 2018  . HERNIA REPAIR    . KNEE ARTHROSCOPY Left 04/2012    WITH MEDIAL MENISECTOMY/notes 04/16/2012  . LEFT HEART CATH AND CORONARY ANGIOGRAPHY N/A 10/21/2018   Procedure: LEFT HEART CATH AND CORONARY ANGIOGRAPHY;  Surgeon: Dixie Dials, MD;  Location: [facility];  Service: Cardiovascular;  Laterality: N/A;  . PATELLA FRACTURE SURGERY Right ~ 2006  . UMBILICAL HERNIA REPAIR  2000s    FAMILY HISTORY Family History  Problem Relation Age of Onset  . Hypertension Mother   . Hypertension Father   . Breast cancer Daughter     SOCIAL HISTORY Social History   Tobacco Use  . Smoking status: Former Smoker    Packs/day: 3.00    Years: 20.00    Pack years: 60.00    Types: Cigarettes    Quit date: 12/09/1972    Years since quitting: 46.7  . Smokeless tobacco: Never Used  Substance Use Topics  . Alcohol use: Yes    Alcohol/week: 1.0 standard drinks    Types: 1 Standard drinks or equivalent per week  . Drug use: Never         OPHTHALMIC EXAM:  Base Eye Exam    Visual Acuity (Snellen - Linear)      Right Left   Dist [place] 20/80 20/150 -2   Dist ph [place] NI NI       Tonometry (Tonopen, 11:00 AM)      Right Left   Pressure 14 13       Pupils      Dark Light Shape React APD   Right 4 3 Round Minimal None   Left 4 3 Round Minimal Trace       Visual Fields (Counting fingers)      Left Right   Restrictions Partial outer  superior temporal, inferior temporal, superior nasal, inferior nasal deficiencies Partial outer superior temporal, inferior temporal, superior nasal, inferior nasal deficiencies       Extraocular Movement      Right Left    Full, Ortho Full, Ortho       Neuro/Psych    Oriented x3: Yes   Mood/Affect: Normal       Dilation    Both eyes: 1.0% Mydriacyl, 2.5% Phenylephrine @ 11:00 AM  Slit Lamp and Fundus Exam    Slit Lamp Exam      Right Left   Lids/Lashes UL edema, Ptosis UL edema, Ptosis   Conjunctiva/Sclera mild Melanosis, 3-4+ Injection mild Melanosis, 1-2+ Injection   Cornea Arcus, 1+ Punctate epithelial erosions, Well healed temporal cataract wounds Arcus, 2+ Punctate epithelial erosions, Well healed cataract wounds   Anterior Chamber deep and clear, cypass device at 0300 deep and clear, cypass device at 0800   Iris Round and dilated, No NVI Round and dilated, No NVI   Lens PC IOL in good position PC IOL in good position   Vitreous Vitreous syneresis Vitreous syneresis, vitreous condensations       Fundus Exam      Right Left   Disc +cupping, 3-4-+pallor, +heme at 0700, temporal PPP +cupping, 3-4+pallor, non-existent inferior rim, temporal PPP   C/D Ratio 0.85 0.95   Macula Flat, recurrent edema, scattered MA superior macula no heme improved foveal reflex, RPE mottling and clumping, Cystic changes - slightly improved, mild exudates temporal macula, mild ERM, good segmental PRP fill in   Vessels Attenuated, Tortuous, AV crossing changes Attenuated, venules Dilated and Tortuous, scleratic vessels temporal periphery   Periphery attached, scattered RPE changes/peripheral drusen, mild IRH/DBH inferior hemishphere - improving Attached, scattered RPE changes, sectoral PRP scars superiorly w/ good fill in, sclerotic vessels temporal periphery          IMAGING AND PROCEDURES  Imaging and Procedures for @TODAY @  OCT, Retina - OU - Both Eyes       Right Eye Quality was  good. Central Foveal Thickness: 316. Progression has improved. Findings include no SRF, epiretinal membrane, macular pucker, abnormal foveal contour, intraretinal fluid (Interval improvement in foveal contour, IRF/CME).   Left Eye Quality was good. Central Foveal Thickness: 309. Progression has worsened. Findings include normal foveal contour, no SRF, intraretinal fluid, outer retinal atrophy, epiretinal membrane, intraretinal hyper-reflective material, macular pucker (Interval increase in IRF/IRHM temporal macula fovea).   Notes *Images captured and stored on drive  Diagnosis / Impression:  HRVO w/ CME OU OD: Interval improvement in foveal contour, IRF/CME OS: Interval Interval increase in IRF/IRHM temporal macula fovea   Clinical management:  See below  Abbreviations: NFP - Normal foveal profile. CME - cystoid macular edema. PED - pigment epithelial detachment. IRF - intraretinal fluid. SRF - subretinal fluid. EZ - ellipsoid zone. ERM - epiretinal membrane. ORA - outer retinal atrophy. ORT - outer retinal tubulation. SRHM - subretinal hyper-reflective material                 ASSESSMENT/PLAN:    ICD-10-CM   1. Branch retinal vein occlusion of both eyes with macular edema  H34.8330   2. Retinal edema  H35.81 OCT, Retina - OU - Both Eyes  3. Primary open angle glaucoma (POAG) of both eyes, severe stage  H40.1133   4. Allergic conjunctivitis of both eyes  H10.13   5. Moderate nonproliferative diabetic retinopathy of both eyes with macular edema associated with type 2 diabetes mellitus ([place])  [id number]   6. Essential hypertension  I10   7. Hypertensive retinopathy of both eyes  H35.033   8. Pseudophakia of both eyes  Z96.1     1,2. HRVO w/ CME OU (OD > OS)  - OD--remote inferior HRVO w/ CME  - OS--remote superior HRVO w/ CME  - pt reports history of previous injections and segmental PRP laser at [facility] "years ago" -- able to obtain records  - last injection  per pt  history, Dr. Adrian Saran in 2015  - s/p superior segmental PRP OU by [facility]  - FA (03.16.20) shows leakage patterns consistent with HRVO w/ CME bilaterally -- OS also with significant capillary nonperfusion temporally -- may benefit from fill in segmental   - s/p IVA OU #1 (03.16.20), #2 (05.22.20), #3 (06.26.20), #4 (08.28.20)  - s/p IVA OS #4 (07.31.20), #5 (08.28.20)  - s/p segmental fill in PRP OS (07.02.20)  - BCVA 20/80 OD, 20/150 OS  -- worsened by allergic conjunctivitis   - OCT shows interval improvement in IRF/CME OD, interval increase in IRF OS PRP  - F/U as scheduled in 2 wks -- DFE/OCT/possible injection(s)  3. POAG -- severe stage OU  - history of CEIOL w/ [place] OU by [name]  - now under the expert management of Dr. [name]  - IOP 14 OD, 13 OS  - currently on Cosopt BID OU -- continue  4. Allergic conjunctivitis / brimonidine toxicity OU  - bilateral irritation, photophobia and injection since starting brimonidine OU  - history of brimonidine toxicity  - d/c brimonidine OU  - cool compresses and chilled ATs prn  5. Moderate nonproliferative diabetic retinopathy OU  - The incidence, risk factors for progression, natural history and treatment options for diabetic retinopathy were discussed with patient.    - The need for close monitoring of blood glucose, blood pressure, and serum lipids, avoiding cigarette or any type of tobacco, and the need for long term follow up was also discussed with patient.  - diabetic retinopathy confounded by HRVOs described above, but may be contributing to macular edema  - last A1c 6.6 on 11.13.19  - monitor  6,7. Hypertensive retinopathy OU  - discussed importance of tight BP control  - monitor  8. Pseudophakia OU  - s/p CE/IOL w/ [place] OU - Dr. Jenene Slicker (2018)  - doing well  - monitor  Ophthalmic Meds Ordered this visit:  No orders of the defined types were placed in this encounter.      Return for as  scheduled.  There are no Patient Instructions on file for this visit.   Explained the diagnoses, plan, and follow up with the patient and they expressed understanding.  Patient expressed understanding of the importance of proper follow up care.   This document serves as a record of services personally performed by Gardiner Sleeper, MD, PhD. It was created on their behalf by Ernest Mallick, OA, an ophthalmic assistant. The creation of this record is the provider's dictation and/or activities during the visit.    Electronically signed by: Ernest Mallick, OA  09.09.2020 11:38 AM     Gardiner Sleeper, M.D., Ph.D. Diseases & Surgery of the Retina and Vitreous [facility]  I have reviewed the above documentation for accuracy and completeness, and I agree with the above. Gardiner Sleeper, M.D., Ph.D. 08/18/19 11:38 AM     Abbreviations: M myopia (nearsighted); A astigmatism; H hyperopia (farsighted); P presbyopia; Mrx spectacle prescription;  CTL contact lenses; OD right eye; OS left eye; OU both eyes  XT exotropia; ET esotropia; PEK punctate epithelial keratitis; PEE punctate epithelial erosions; DES dry eye syndrome; MGD meibomian gland dysfunction; ATs artificial tears; PFAT's preservative free artificial tears; [place] nuclear sclerotic cataract; PSC posterior subcapsular cataract; ERM epi-retinal membrane; PVD posterior vitreous detachment; RD retinal detachment; DM diabetes mellitus; DR diabetic retinopathy; NPDR non-proliferative diabetic retinopathy; PDR proliferative diabetic retinopathy; CSME clinically significant macular edema; DME diabetic  macular edema; dbh dot blot hemorrhages; CWS cotton wool spot; POAG primary open angle glaucoma; C/D cup-to-disc ratio; HVF humphrey visual field; GVF goldmann visual field; OCT optical coherence tomography; IOP intraocular pressure; BRVO Branch retinal vein occlusion; CRVO central retinal vein occlusion; CRAO central retinal artery  occlusion; BRAO branch retinal artery occlusion; RT retinal tear; SB scleral buckle; PPV pars plana vitrectomy; VH Vitreous hemorrhage; PRP panretinal laser photocoagulation; IVK intravitreal kenalog; VMT vitreomacular traction; MH Macular hole;  NVD neovascularization of the disc; NVE neovascularization elsewhere; AREDS age related eye disease study; ARMD age related macular degeneration; POAG primary open angle glaucoma; EBMD epithelial/anterior basement membrane dystrophy; ACIOL anterior chamber intraocular lens; IOL intraocular lens; PCIOL posterior chamber intraocular lens; Phaco/IOL phacoemulsification with intraocular lens placement; [place] photorefractive keratectomy; LASIK laser assisted in situ keratomileusis; HTN hypertension; DM diabetes mellitus; COPD chronic obstructive pulmonary disease

## 2019-08-25 NOTE — Progress Notes (Signed)
[facility] Note  08/26/2019     CHIEF COMPLAINT Patient presents for Retina Follow Up   HISTORY OF PRESENT ILLNESS: Chelsea Gallegos is a 85 y.o. female who presents to the clinic today for:   HPI    Retina Follow Up    Patient presents with  CRVO/BRVO.  In both eyes.  This started weeks ago.  Severity is moderate.  Duration of weeks.  Since onset it is gradually worsening.  I, the attending physician,  performed the HPI with the patient and updated documentation appropriately.          Comments    Has not checked BS recently  Patient states her vision has decreased in both eyes since last injection.  Patient denies eye pain or discomfort and denies any new or worsening floaters or fol OU.       Last edited by Chelsea Caffey, MD on 08/26/2019  8:34 AM. (History)    pt states her vision has been decreasing since her last visit on 08/28, pt used Cosopt this morning before her visit  Referring physician: Benay Pike, MD [address],  [place] [address]  HISTORICAL INFORMATION:   Selected notes from the MEDICAL RECORD NUMBER Referred by Dr. Quentin Gallegos for concern of worsening CME OD LEE: 03.05.20 (Chelsea Gallegos) [BCVA: OD: 20/40 OS: 20/100] Ocular Hx-POAG, pseudo OU, CME OU, BRVO OU, follicular conjunctivitis OU, iritis OU, NPDR OU, scar on posterior pole OS, last visit with Dr. Celesta Gallegos (02.2015), received 15 Avastins/10 Lucentis, focal laser x2, PRP (02.22.12) PMH-DM (A1c: 6.6, takes metformin), HLD, asthma    CURRENT MEDICATIONS: Current Outpatient Medications (Ophthalmic Drugs)  Medication Sig  . brimonidine (ALPHAGAN) 0.2 % ophthalmic solution Place 1 drop into both eyes 2 (two) times daily.  . dorzolamide (TRUSOPT) 2 % ophthalmic solution Place 1 drop into both eyes 2 (two) times daily.  . dorzolamide-timolol (COSOPT) 22.3-6.8 MG/ML ophthalmic solution Place 1 drop into both eyes 2 (two) times daily.  . prednisoLONE acetate  (PRED FORTE) 1 % ophthalmic suspension INSTILL 1 DROP INTO EACH EYE TWICE DAILY  . prednisoLONE acetate (PRED FORTE) 1 % ophthalmic suspension 1 drop 4 times daily in the left eye for 7 days then stop (Patient taking differently: Place 1 drop into both eyes. 1 drop 2 times daily in the left eye for 7 days then stop)   No current facility-administered medications for this visit.  (Ophthalmic Drugs)   Current Outpatient Medications (Other)  Medication Sig  . apixaban (ELIQUIS) 5 MG TABS tablet Take 1 tablet (5 mg total) by mouth 2 (two) times daily.  Marland Kitchen aspirin 81 MG chewable tablet Chew 162 mg by mouth once.  Marland Kitchen atorvastatin (LIPITOR) 40 MG tablet Take 1 tablet (40 mg total) by mouth daily at 6 PM. (Patient not taking: Reported on 07/09/2019)  . digoxin (LANOXIN) 0.125 MG tablet Take 1 tablet (0.125 mg total) by mouth every Monday, Wednesday, and Friday.  . diltiazem (CARDIZEM) 60 MG tablet Take 1 tablet (60 mg total) by mouth 2 (two) times daily.  . metFORMIN (GLUCOPHAGE) 500 MG tablet Take 1 tablet (500 mg total) by mouth 2 (two) times daily with a meal. (Patient not taking: Reported on 07/09/2019)  . potassium chloride (K-DUR,KLOR-CON) 10 MEQ tablet Take 1 tablet (10 mEq total) by mouth 2 (two) times daily.   No current facility-administered medications for this visit.  (Other)      REVIEW OF SYSTEMS: ROS  Positive for: Gastrointestinal, Endocrine, Eyes   Negative for: Constitutional, Neurological, Skin, Genitourinary, Musculoskeletal, HENT, Cardiovascular, Respiratory, Psychiatric, Allergic/Imm, Heme/Lymph   Last edited by Chelsea Gallegos on 08/26/2019  8:07 AM. (History)       ALLERGIES No Known Allergies  PAST MEDICAL HISTORY Past Medical History:  Diagnosis Date  . Arthritis    "arms" (10/20/2018)  . Asthma   . Diabetic retinopathy (Chelsea Gallegos)    NPDR OU  . GERD (gastroesophageal reflux disease)   . Glaucoma    POAG OU  . History of gout   . History of hiatal hernia   .  History of kidney stones   . Hyperlipidemia ~2005  . Hypertension   . Hypertensive retinopathy    OU  . Refusal of blood transfusions as patient is Jehovah's Witness   . Stroke Chelsea Gallegos) 04/2017   "dr told me I had a stroke in my left eye" (10/20/2018)  . Type II diabetes mellitus ([place])    Past Surgical History:  Procedure Laterality Date  . CATARACT EXTRACTION Bilateral    Dr. Anastasio Gallegos  . CATARACT EXTRACTION, BILATERAL Bilateral 2018  . CHOLECYSTECTOMY     pt does not recall this OR (10/20/2018)  . CYSTOSCOPY W/ STONE MANIPULATION    . EXCISIONAL HEMORRHOIDECTOMY  1980s/1990s   "& he cut my muscle that controls my bowels"  . EYE SURGERY    . FRACTURE SURGERY    . GLAUCOMA SURGERY Bilateral 2018  . HERNIA REPAIR    . KNEE ARTHROSCOPY Left 04/2012    WITH MEDIAL MENISECTOMY/notes 04/16/2012  . LEFT HEART CATH AND CORONARY ANGIOGRAPHY N/A 10/21/2018   Procedure: LEFT HEART CATH AND CORONARY ANGIOGRAPHY;  Surgeon: Chelsea Dials, MD;  Location: [facility];  Service: Cardiovascular;  Laterality: N/A;  . PATELLA FRACTURE SURGERY Right ~ 2006  . UMBILICAL HERNIA REPAIR  2000s    FAMILY HISTORY Family History  Problem Relation Age of Onset  . Hypertension Mother   . Hypertension Father   . Breast cancer Daughter     SOCIAL HISTORY Social History   Tobacco Use  . Smoking status: Former Smoker    Packs/day: 3.00    Years: 20.00    Pack years: 60.00    Types: Cigarettes    Quit date: 12/09/1972    Years since quitting: 46.7  . Smokeless tobacco: Never Used  Substance Use Topics  . Alcohol use: Yes    Alcohol/week: 1.0 standard drinks    Types: 1 Standard drinks or equivalent per week  . Drug use: Never         OPHTHALMIC EXAM:  Base Eye Exam    Visual Acuity (Snellen - Linear)      Right Left   Dist [place] 20/60 +1 20/250   Dist ph [place] NI NI       Tonometry (Tonopen, 8:17 AM)      Right Left   Pressure 22 30       Tonometry #2 (Applanation, 8:17 AM)      Right  Left   Pressure 19 28       Tonometry #3 (Tonopen, 8:50 AM)      Right Left   Pressure 20 29       Tonometry #4 (Tonopen, 8:51 AM)      Right Left   Pressure  30       Tonometry #5 (Tonopen, 10:05 AM)      Right Left   Pressure 19 29  Pupils      Dark Light Shape React APD   Right 3 2 Round Minimal 0   Left 3 2 Round Minimal Trace       Visual Fields      Left Right   Restrictions Partial outer superior temporal, inferior temporal, superior nasal, inferior nasal deficiencies Partial outer superior temporal, inferior temporal, superior nasal, inferior nasal deficiencies       Extraocular Movement      Right Left    Full Full       Neuro/Psych    Oriented x3: Yes   Mood/Affect: Normal       Dilation    Both eyes: 1.0% Mydriacyl, 2.5% Phenylephrine @ 8:18 AM        Slit Lamp and Fundus Exam    Slit Lamp Exam      Right Left   Lids/Lashes UL edema, Ptosis UL edema, Ptosis   Conjunctiva/Sclera mild Melanosis, 3-4+ Injection mild Melanosis, 1-2+ Injection   Cornea Arcus, 1+ Punctate epithelial erosions, Well healed temporal cataract wounds Arcus, 2+ Punctate epithelial erosions, Well healed cataract wounds   Anterior Chamber deep and clear, cypass device at 0300 deep and clear, cypass device at 0800   Iris Round and dilated, No NVI Round and dilated, No NVI   Lens PC IOL in good position PC IOL in good position   Vitreous Vitreous syneresis Vitreous syneresis, vitreous condensations       Fundus Exam      Right Left   Disc +cupping, 3-4-+pallor, +heme at 0700, temporal PPP +cupping, 3-4+pallor, non-existent inferior rim, temporal PPP   C/D Ratio 0.85 0.95   Macula Flat, blunted foveal reflex, interval increase in edema, scattered MA superior macula, no heme improved foveal reflex, RPE mottling and clumping, Cystic changes - slightly improved, mild exudates temporal macula, mild ERM, good segmental PRP fill in   Vessels Attenuated, Tortuous, AV crossing  changes Attenuated, venules Dilated and Tortuous, scleratic vessels temporal periphery   Periphery attached, scattered RPE changes/peripheral drusen, mild IRH/DBH inferior hemishphere - improving Attached, scattered RPE changes, sectoral PRP scars superiorly w/ good fill in, sclerotic vessels temporal periphery        Refraction    Wearing Rx      Sphere   Right None   Left None       Manifest Refraction      Sphere Cylinder Axis Dist VA   Right -0.50 +0.50 180 20/50-1   Left NI +/-3.00             IMAGING AND PROCEDURES  Imaging and Procedures for @TODAY @  OCT, Retina - OU - Both Eyes       Right Eye Quality was good. Central Foveal Thickness: 362. Progression has worsened. Findings include epiretinal membrane, macular pucker, abnormal foveal contour, intraretinal fluid, subretinal fluid (Interval increase in IRF/SRF).   Left Eye Quality was good. Central Foveal Thickness: 422. Progression has worsened. Findings include intraretinal fluid, outer retinal atrophy, epiretinal membrane, intraretinal hyper-reflective material, macular pucker, abnormal foveal contour, subretinal fluid (Interval increase in IRF/SRF).   Notes *Images captured and stored on drive  Diagnosis / Impression:  HRVO w/ CME OU OD: Interval increase in IRF/SRF OS: Interval increase in IRF/SRF  Clinical management:  See below  Abbreviations: NFP - Normal foveal profile. CME - cystoid macular edema. PED - pigment epithelial detachment. IRF - intraretinal fluid. SRF - subretinal fluid. EZ - ellipsoid zone. ERM - epiretinal membrane. ORA - outer retinal atrophy. ORT -  outer retinal tubulation. SRHM - subretinal hyper-reflective material                 ASSESSMENT/PLAN:    ICD-10-CM   1. Branch retinal vein occlusion of both eyes with macular edema  H34.8330   2. Retinal edema  H35.81 OCT, Retina - OU - Both Eyes  3. Primary open angle glaucoma (POAG) of both eyes, severe stage  H40.1133   4.  Allergic conjunctivitis of both eyes  H10.13   5. Moderate nonproliferative diabetic retinopathy of both eyes with macular edema associated with type 2 diabetes mellitus ([place])  [id number]   6. Essential hypertension  I10   7. Hypertensive retinopathy of both eyes  H35.033   8. Pseudophakia of both eyes  Z96.1     1,2. HRVO w/ CME OU (OD > OS)  - OD--remote inferior HRVO w/ CME  - OS--remote superior HRVO w/ CME  - pt reports history of previous injections and segmental PRP laser at [facility] "years ago" -- able to obtain records  - last injection per pt history, Dr. Adrian Saran in 2015  - s/p superior segmental PRP OU by [facility]  - FA (03.16.20) shows leakage patterns consistent with HRVO w/ CME bilaterally -- OS also with significant capillary nonperfusion temporally -- may benefit from segmental fill in PRP  - s/p IVA OU #1 (03.16.20), #2 (05.22.20), #3 (06.26.20), #4 (08.28.20)  - s/p IVA OS #4 (07.31.20), #5 (08.28.20)  - s/p segmental fill in PRP OS (07.02.20)  - BCVA 20/60 OD, 20/250 OS  - OCT shows interval interval increase in IRF/SRF   - F/U as scheduled in 2 wks -- DFE/OCT/possible injection(s)  3. POAG -- severe stage OU  - history of CEIOL w/ [place] OU by Dr. Anastasio Gallegos  - c/d 0.85 OD, 0.9 OS  - now under the expert management of Dr. [name]  - target IOPs -- low teens OU  - IOP 20 OD, 30 OS - one drop of latanoprost and 500mg  acetazolamide PO given in office  - 30 min after administration of medications, IOP remains elevated at 19 OD and 30 OS  - currently on Cosopt BID OU -- increase to TID OU  - start latanoprost qhs OU -- bottle given to patient  - case discussed with Dr. [name], who has agreed to see pt urgently this afternoon  4. Allergic conjunctivitis / brimonidine toxicity OU -- resolved  - bilateral irritation, photophobia and injection on brimonidine  - resolved following d/c  5. Moderate nonproliferative diabetic retinopathy OU  - The incidence,  risk factors for progression, natural history and treatment options for diabetic retinopathy were discussed with patient.    - The need for close monitoring of blood glucose, blood pressure, and serum lipids, avoiding cigarette or any type of tobacco, and the need for long term follow up was also discussed with patient.  - diabetic retinopathy confounded by HRVOs described above, but may be contributing to macular edema  - last A1c 6.6 on 11.13.19  - monitor  6,7. Hypertensive retinopathy OU  - discussed importance of tight BP control  - monitor  8. Pseudophakia OU  - s/p CE/IOL w/ [place] OU - Dr. Jenene Slicker (2018)  - doing well  - monitor  Ophthalmic Meds Ordered this visit:  No orders of the defined types were placed in this encounter.      Return for as scheduled.  There are no Patient Instructions on file for this visit.   Explained  the diagnoses, plan, and follow up with the patient and they expressed understanding.  Patient expressed understanding of the importance of proper follow up care.   This document serves as a record of services personally performed by Gardiner Sleeper, MD, PhD. It was created on their behalf by Ernest Mallick, OA, an ophthalmic assistant. The creation of this record is the provider's dictation and/or activities during the visit.    Electronically signed by: Ernest Mallick, OA  09.16.2020 12:52 PM    Gardiner Sleeper, M.D., Ph.D. Diseases & Surgery of the Retina and Vitreous [facility]  I have reviewed the above documentation for accuracy and completeness, and I agree with the above. Gardiner Sleeper, M.D., Ph.D. 08/26/19 12:52 PM   Abbreviations: M myopia (nearsighted); A astigmatism; H hyperopia (farsighted); P presbyopia; Mrx spectacle prescription;  CTL contact lenses; OD right eye; OS left eye; OU both eyes  XT exotropia; ET esotropia; PEK punctate epithelial keratitis; PEE punctate epithelial erosions; DES dry eye  syndrome; MGD meibomian gland dysfunction; ATs artificial tears; PFAT's preservative free artificial tears; [place] nuclear sclerotic cataract; PSC posterior subcapsular cataract; ERM epi-retinal membrane; PVD posterior vitreous detachment; RD retinal detachment; DM diabetes mellitus; DR diabetic retinopathy; NPDR non-proliferative diabetic retinopathy; PDR proliferative diabetic retinopathy; CSME clinically significant macular edema; DME diabetic macular edema; dbh dot blot hemorrhages; CWS cotton wool spot; POAG primary open angle glaucoma; C/D cup-to-disc ratio; HVF humphrey visual field; GVF goldmann visual field; OCT optical coherence tomography; IOP intraocular pressure; BRVO Branch retinal vein occlusion; CRVO central retinal vein occlusion; CRAO central retinal artery occlusion; BRAO branch retinal artery occlusion; RT retinal tear; SB scleral buckle; PPV pars plana vitrectomy; VH Vitreous hemorrhage; PRP panretinal laser photocoagulation; IVK intravitreal kenalog; VMT vitreomacular traction; MH Macular hole;  NVD neovascularization of the disc; NVE neovascularization elsewhere; AREDS age related eye disease study; ARMD age related macular degeneration; POAG primary open angle glaucoma; EBMD epithelial/anterior basement membrane dystrophy; ACIOL anterior chamber intraocular lens; IOL intraocular lens; PCIOL posterior chamber intraocular lens; Phaco/IOL phacoemulsification with intraocular lens placement; [place] photorefractive keratectomy; LASIK laser assisted in situ keratomileusis; HTN hypertension; DM diabetes mellitus; COPD chronic obstructive pulmonary disease

## 2019-08-26 ENCOUNTER — Encounter (INDEPENDENT_AMBULATORY_CARE_PROVIDER_SITE_OTHER): Payer: Self-pay | Admitting: Ophthalmology

## 2019-08-26 ENCOUNTER — Ambulatory Visit (INDEPENDENT_AMBULATORY_CARE_PROVIDER_SITE_OTHER): Payer: Medicare Other | Admitting: Ophthalmology

## 2019-08-26 ENCOUNTER — Other Ambulatory Visit: Payer: Self-pay

## 2019-08-26 DIAGNOSIS — H401133 Primary open-angle glaucoma, bilateral, severe stage: Secondary | ICD-10-CM

## 2019-08-26 DIAGNOSIS — E113313 Type 2 diabetes mellitus with moderate nonproliferative diabetic retinopathy with macular edema, bilateral: Secondary | ICD-10-CM

## 2019-08-26 DIAGNOSIS — I10 Essential (primary) hypertension: Secondary | ICD-10-CM

## 2019-08-26 DIAGNOSIS — H3581 Retinal edema: Secondary | ICD-10-CM | POA: Diagnosis not present

## 2019-08-26 DIAGNOSIS — H1013 Acute atopic conjunctivitis, bilateral: Secondary | ICD-10-CM

## 2019-08-26 DIAGNOSIS — H34833 Tributary (branch) retinal vein occlusion, bilateral, with macular edema: Secondary | ICD-10-CM | POA: Diagnosis not present

## 2019-08-26 DIAGNOSIS — H35033 Hypertensive retinopathy, bilateral: Secondary | ICD-10-CM

## 2019-08-26 DIAGNOSIS — Z961 Presence of intraocular lens: Secondary | ICD-10-CM

## 2019-09-03 ENCOUNTER — Encounter (INDEPENDENT_AMBULATORY_CARE_PROVIDER_SITE_OTHER): Payer: Medicare Other | Admitting: Ophthalmology

## 2019-09-10 ENCOUNTER — Encounter (INDEPENDENT_AMBULATORY_CARE_PROVIDER_SITE_OTHER): Payer: Medicare Other | Admitting: Ophthalmology

## 2019-09-10 ENCOUNTER — Telehealth (INDEPENDENT_AMBULATORY_CARE_PROVIDER_SITE_OTHER): Payer: Self-pay

## 2019-10-06 ENCOUNTER — Encounter (INDEPENDENT_AMBULATORY_CARE_PROVIDER_SITE_OTHER): Payer: Self-pay | Admitting: Ophthalmology

## 2019-10-06 ENCOUNTER — Other Ambulatory Visit: Payer: Self-pay

## 2019-10-06 ENCOUNTER — Ambulatory Visit (INDEPENDENT_AMBULATORY_CARE_PROVIDER_SITE_OTHER): Payer: Medicare Other | Admitting: Ophthalmology

## 2019-10-06 DIAGNOSIS — H34833 Tributary (branch) retinal vein occlusion, bilateral, with macular edema: Secondary | ICD-10-CM | POA: Diagnosis not present

## 2019-10-06 DIAGNOSIS — I10 Essential (primary) hypertension: Secondary | ICD-10-CM

## 2019-10-06 DIAGNOSIS — H3581 Retinal edema: Secondary | ICD-10-CM | POA: Diagnosis not present

## 2019-10-06 DIAGNOSIS — H401133 Primary open-angle glaucoma, bilateral, severe stage: Secondary | ICD-10-CM | POA: Diagnosis not present

## 2019-10-06 DIAGNOSIS — H1013 Acute atopic conjunctivitis, bilateral: Secondary | ICD-10-CM

## 2019-10-06 DIAGNOSIS — Z961 Presence of intraocular lens: Secondary | ICD-10-CM

## 2019-10-06 DIAGNOSIS — E113313 Type 2 diabetes mellitus with moderate nonproliferative diabetic retinopathy with macular edema, bilateral: Secondary | ICD-10-CM

## 2019-10-06 DIAGNOSIS — H35033 Hypertensive retinopathy, bilateral: Secondary | ICD-10-CM

## 2019-10-06 MED ORDER — BEVACIZUMAB CHEMO INJECTION 1.25MG/0.05ML SYRINGE FOR KALEIDOSCOPE
1.2500 mg | INTRAVITREAL | Status: AC | PRN
  Administered 2019-10-06: 13:00:00 1.25 mg via INTRAVITREAL

## 2019-10-06 NOTE — Progress Notes (Signed)
[facility] Note  10/06/2019     CHIEF COMPLAINT Patient presents for Retina Follow Up   HISTORY OF PRESENT ILLNESS: Chelsea Gallegos is a 85 y.o. female who presents to the clinic today for:   HPI    Retina Follow Up    Patient presents with  CRVO/BRVO.  In both eyes.  This started weeks ago.  Severity is moderate.  Duration of weeks.  I, the attending physician,  performed the HPI with the patient and updated documentation appropriately.          Comments    Patient states her vision is worsening OU.  Patient complains of feeling like blood was coming out of her eyes over the last week.  Patient saw Dr. [name] on [id number] for acute follicular conjunctivitis and is currently using PO Doxycycline once daily, and FML BID OU.  Patient was given Cosopt PF due to intolerances from other medications and told to use TID OU but patient was under the impression she was to use it BID OU.       Last edited by [name], MD on 10/06/2019 10:54 AM. (History)    pt states her vision is getting worse, she states she is using PF Cosopt and doing well on it  Referring physician: [name], MD [place],  [place] [id number]  HISTORICAL INFORMATION:   Selected notes from the MEDICAL RECORD NUMBER Referred by Dr. [name] for concern of worsening CME OD LEE: 03.05.20 Read Drivers) [BCVA: OD: 20/40 OS: 20/100] Ocular Hx-POAG, pseudo OU, CME OU, BRVO OU, follicular conjunctivitis OU, iritis OU, NPDR OU, scar on posterior pole OS, last visit with [name] (02.2015), received 15 Avastins/10 Lucentis, focal laser x2, PRP (02.22.12) PMH-DM (A1c: 6.6, takes metformin), HLD, asthma    CURRENT MEDICATIONS: Current Outpatient Medications (Ophthalmic Drugs)  Medication Sig  . brimonidine (ALPHAGAN) 0.2 % ophthalmic solution Place 1 drop into both eyes 2 (two) times daily.  . dorzolamide (TRUSOPT) 2 % ophthalmic solution Place 1 drop  into both eyes 2 (two) times daily.  . dorzolamide-timolol (COSOPT) 22.3-6.8 MG/ML ophthalmic solution Place 1 drop into both eyes 2 (two) times daily.  . prednisoLONE acetate (PRED FORTE) 1 % ophthalmic suspension INSTILL 1 DROP INTO EACH EYE TWICE DAILY  . prednisoLONE acetate (PRED FORTE) 1 % ophthalmic suspension 1 drop 4 times daily in the left eye for 7 days then stop (Patient taking differently: Place 1 drop into both eyes. 1 drop 2 times daily in the left eye for 7 days then stop)   No current facility-administered medications for this visit.  (Ophthalmic Drugs)   Current Outpatient Medications (Other)  Medication Sig  . apixaban (ELIQUIS) 5 MG TABS tablet Take 1 tablet (5 mg total) by mouth 2 (two) times daily.  Marland Kitchen aspirin 81 MG chewable tablet Chew 162 mg by mouth once.  Marland Kitchen atorvastatin (LIPITOR) 40 MG tablet Take 1 tablet (40 mg total) by mouth daily at 6 PM. (Patient not taking: Reported on 07/09/2019)  . digoxin (LANOXIN) 0.125 MG tablet Take 1 tablet (0.125 mg total) by mouth every Monday, Wednesday, and Friday.  . diltiazem (CARDIZEM) 60 MG tablet Take 1 tablet (60 mg total) by mouth 2 (two) times daily.  . metFORMIN (GLUCOPHAGE) 500 MG tablet Take 1 tablet (500 mg total) by mouth 2 (two) times daily with a meal. (Patient not taking: Reported on 07/09/2019)  . potassium chloride (K-DUR,KLOR-CON) 10 MEQ  tablet Take 1 tablet (10 mEq total) by mouth 2 (two) times daily.   No current facility-administered medications for this visit.  (Other)      REVIEW OF SYSTEMS: ROS    Positive for: Gastrointestinal, Endocrine, Eyes   Negative for: Constitutional, Neurological, Skin, Genitourinary, Musculoskeletal, HENT, Cardiovascular, Respiratory, Psychiatric, Allergic/Imm, Heme/Lymph   Last edited by [name] on 10/06/2019  9:38 AM. (History)       ALLERGIES No Known Allergies  PAST MEDICAL HISTORY Past Medical History:  Diagnosis Date  . Arthritis    "arms" (10/20/2018)   . Asthma   . Diabetic retinopathy ([place])    NPDR OU  . GERD (gastroesophageal reflux disease)   . Glaucoma    POAG OU  . History of gout   . History of hiatal hernia   . History of kidney stones   . Hyperlipidemia ~2005  . Hypertension   . Hypertensive retinopathy    OU  . Refusal of blood transfusions as patient is Jehovah's Witness   . Stroke [facility] 04/2017   "dr told me I had a stroke in my left eye" (10/20/2018)  . Type II diabetes mellitus ([place])    Past Surgical History:  Procedure Laterality Date  . CATARACT EXTRACTION Bilateral    [name]  . CATARACT EXTRACTION, BILATERAL Bilateral 2018  . CHOLECYSTECTOMY     pt does not recall this OR (10/20/2018)  . CYSTOSCOPY W/ STONE MANIPULATION    . EXCISIONAL HEMORRHOIDECTOMY  1980s/1990s   "& he cut my muscle that controls my bowels"  . EYE SURGERY    . FRACTURE SURGERY    . GLAUCOMA SURGERY Bilateral 2018  . HERNIA REPAIR    . KNEE ARTHROSCOPY Left 04/2012    WITH MEDIAL MENISECTOMY/notes 04/16/2012  . LEFT HEART CATH AND CORONARY ANGIOGRAPHY N/A 10/21/2018   Procedure: LEFT HEART CATH AND CORONARY ANGIOGRAPHY;  Surgeon: Dixie Dials, MD;  Location: [facility];  Service: Cardiovascular;  Laterality: N/A;  . PATELLA FRACTURE SURGERY Right ~ 2006  . UMBILICAL HERNIA REPAIR  2000s    FAMILY HISTORY Family History  Problem Relation Age of Onset  . Hypertension Mother   . Hypertension Father   . Breast cancer Daughter     SOCIAL HISTORY Social History   Tobacco Use  . Smoking status: Former Smoker    Packs/day: 3.00    Years: 20.00    Pack years: 60.00    Types: Cigarettes    Quit date: 12/09/1972    Years since quitting: 46.8  . Smokeless tobacco: Never Used  Substance Use Topics  . Alcohol use: Yes    Alcohol/week: 1.0 standard drinks    Types: 1 Standard drinks or equivalent per week  . Drug use: Never         OPHTHALMIC EXAM:  Base Eye Exam    Visual Acuity (Snellen - Linear)      Right  Left   Dist [place] 20/100 -1 20/150 -1   Dist ph [place] 20/50 -2 NI       Tonometry (Tonopen, 9:46 AM)      Right Left   Pressure 17 14       Pupils      Dark Light Shape React APD   Right 3 2 Round Minimal 0   Left 3 2 Round Minimal 0       Visual Fields      Left Right   Restrictions Partial outer superior temporal, inferior temporal, superior  nasal, inferior nasal deficiencies Partial outer superior temporal, inferior temporal, superior nasal, inferior nasal deficiencies       Extraocular Movement      Right Left    Full Full       Neuro/Psych    Oriented x3: Yes   Mood/Affect: Normal       Dilation    Both eyes: 1.0% Mydriacyl, 2.5% Phenylephrine @ 9:46 AM        Slit Lamp and Fundus Exam    Slit Lamp Exam      Right Left   Lids/Lashes UL edema, Ptosis UL edema, Ptosis   Conjunctiva/Sclera mild Melanosis mild Melanosis   Cornea Arcus, 1+ Punctate epithelial erosions, Well healed temporal cataract wounds Arcus, 2+ Punctate epithelial erosions, Well healed cataract wounds   Anterior Chamber deep and clear, cypass device at 0300 deep and clear, cypass device at 0800   Iris Round and dilated, No NVI Round and dilated, No NVI   Lens PC IOL in good position PC IOL in good position   Vitreous Vitreous syneresis Vitreous syneresis, vitreous condensations       Fundus Exam      Right Left   Disc +central cupping, 3-+pallor, +heme at 0700, temporal PPP, very thin superior rim +cupping, 3-4+pallor, non-existent inferior rim, temporal PPP   C/D Ratio 0.9 0.95   Macula Flat, blunted foveal reflex, central CME, interval increase in edema, scattered MA inferior macula - improving, no heme Blunted foveal reflex, RPE mottling and clumping, Cystic changes - slightly improved, focal central SRF, mild exudates temporal macula, mild ERM   Vessels Attenuated, Tortuous, AV crossing changes, superior veins slightly dilated Attenuated, Tortuous, scleratic vessels temporal periphery    Periphery attached, scattered RPE changes/peripheral drusen, mild IRH/DBH inferior hemishphere - improving Attached, scattered RPE changes, sectoral PRP scars superiorly w/ good fill in, sclerotic vessels temporal periphery        Refraction    Wearing Rx      Sphere   Right None   Left None          IMAGING AND PROCEDURES  Imaging and Procedures for @TODAY @  Intravitreal Injection, Pharmacologic Agent - OD - Right Eye       Time Out 10/06/2019. 9:46 AM. Confirmed correct patient, procedure, site, and patient consented.   Anesthesia Topical anesthesia was used. Anesthetic medications included Lidocaine 2%, Proparacaine 0.5%.   Procedure Preparation included 5% betadine to ocular surface, eyelid speculum. A supplied needle was used.   Injection:  1.25 mg Bevacizumab (AVASTIN) SOLN   NDC: [id number], Lot: [id number] , Expiration date: 11/24/2019   Route: Intravitreal, Site: Right Eye, Waste: 0 mL  Post-op Post injection exam found visual acuity of at least counting fingers. The patient tolerated the procedure well. There were no complications. The patient received written and verbal post procedure care education.        Intravitreal Injection, Pharmacologic Agent - OS - Left Eye       Time Out 10/06/2019. 9:46 AM. Confirmed correct patient, procedure, site, and patient consented.   Anesthesia Topical anesthesia was used. Anesthetic medications included Lidocaine 2%, Proparacaine 0.5%.   Procedure Preparation included 5% betadine to ocular surface, eyelid speculum. A 30 gauge needle was used.   Injection:  1.25 mg Bevacizumab (AVASTIN) SOLN   NDC: [id number], Lot: [id number] , Expiration date: 12/21/2018   Route: Intravitreal, Site: Left Eye, Waste: 0 mL  Post-op Post injection exam found visual acuity of at least counting fingers. The patient tolerated the procedure  well. There were no complications. The patient received written and verbal post procedure  care education.        OCT, Retina - OU - Both Eyes       Right Eye Quality was good. Central Foveal Thickness: 614. Progression has worsened. Findings include epiretinal membrane, macular pucker, abnormal foveal contour, intraretinal fluid, subretinal fluid (Interval increase in IRF).   Left Eye Quality was good. Central Foveal Thickness: 313. Progression has improved. Findings include intraretinal fluid, outer retinal atrophy, epiretinal membrane, intraretinal hyper-reflective material, macular pucker, subretinal fluid, normal foveal contour (Interval improvement in IRF; interval worsening of SRF).   Notes *Images captured and stored on drive  Diagnosis / Impression:  HRVO w/ CME OU OD: Interval increase in IRF OS: Interval improvement in IRF; interval worsening of SRF  Clinical management:  See below  Abbreviations: NFP - Normal foveal profile. CME - cystoid macular edema. PED - pigment epithelial detachment. IRF - intraretinal fluid. SRF - subretinal fluid. EZ - ellipsoid zone. ERM - epiretinal membrane. ORA - outer retinal atrophy. ORT - outer retinal tubulation. SRHM - subretinal hyper-reflective material                 ASSESSMENT/PLAN:    ICD-10-CM   1. Branch retinal vein occlusion of both eyes with macular edema  H34.8330 Intravitreal Injection, Pharmacologic Agent - OD - Right Eye    Intravitreal Injection, Pharmacologic Agent - OS - Left Eye    Bevacizumab (AVASTIN) SOLN 1.25 mg    Bevacizumab (AVASTIN) SOLN 1.25 mg  2. Retinal edema  H35.81 OCT, Retina - OU - Both Eyes  3. Primary open angle glaucoma (POAG) of both eyes, severe stage  H40.1133   4. Allergic conjunctivitis of both eyes  H10.13   5. 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 Essential hypertension  I10   7. Hypertensive retinopathy of both eyes  H35.033   8. Pseudophakia of both eyes  Z96.1      1,2. HRVO w/ CME OU (OD > OS)  - OD--remote inferior HRVO w/ CME  - OS--remote superior HRVO w/ CME  - pt reports history of previous injections and segmental PRP laser at [facility] "years ago" -- able to obtain records  - last injection per pt history, Dr. Adrian Saran in 2015  - s/p superior segmental PRP OU by [facility]  - FA (03.16.20) shows leakage patterns consistent with HRVO w/ CME bilaterally -- OS also with significant capillary nonperfusion temporally -- may benefit from segmental fill in PRP  - s/p IVA OD #1 (03.16.20), #2 (05.22.20), #3 (06.26.20), #4 (08.28.20)  - s/p IVA OS #1 (03.16.20), #2 (05.22.20), #3 (06.26.20), #4 (07.31.20), #5 (08.28.20)  - s/p segmental fill in PRP OS (07.02.20)  - BCVA 20/50 OD, 20/150 OS  - OCT shows interval increase in IRF/SRF OU  - recommend IVA OD #5 and OS #6, today (10.28.20)  - RBA of procedure discussed, questions answered  - informed consent obtained and signed  - see procedure note -- AC taps done OU immediately post injection to lower IOP  - F/U 4 wks -- DFE/OCT/possible injection(s)  3. POAG -- severe stage OU  - history of CEIOL w/ [place] OU by [name]  - c/d 0.9 OD, 0.9 OS  - now under the expert management of Dr. [name]  - target IOPs -- low teens OU  - significant history of drop  intolerance to various anti-ocular hypertensive meds  - now on preservative-free Cosopt BID OU and IOP 17 and 14 today  - per Dr. Kathleen Argue notes, pt should be using PF Cosopt TID OU -- instructed pt to increase use to TID OU  - pt has appt with Dr. Ander Slade in December for [facility] consult  4. Allergic conjunctivitis / brimonidine toxicity OU -- resolved  - bilateral irritation, photophobia and injection on brimonidine  - resolved following d/c  - doing well on PF Cosopt  5. Moderate nonproliferative diabetic retinopathy OU  - The incidence, risk factors for progression, natural history and treatment options for diabetic retinopathy  were discussed with patient.    - The need for close monitoring of blood glucose, blood pressure, and serum lipids, avoiding cigarette or any type of tobacco, and the need for long term follow up was also discussed with patient.  - diabetic retinopathy confounded by HRVOs described above, but may be contributing to macular edema  - last A1c 6.6 on 11.13.19  - monitor  6,7. Hypertensive retinopathy OU  - discussed importance of tight BP control  - monitor  8. Pseudophakia OU  - s/p CE/IOL w/ [place] OU - Dr. Jenene Slicker (2018)  - doing well  - monitor  Ophthalmic Meds Ordered this visit:  Meds ordered this encounter  Medications  . Bevacizumab (AVASTIN) SOLN 1.25 mg  . Bevacizumab (AVASTIN) SOLN 1.25 mg       Return in about 4 weeks (around 11/03/2019) for f/u HRVO with CME OU, DFE, OCT.  There are no Patient Instructions on file for this visit.   Explained the diagnoses, plan, and follow up with the patient and they expressed understanding.  Patient expressed understanding of the importance of proper follow up care.   This document serves as a record of services personally performed by Gardiner Sleeper, MD, PhD. It was created on their behalf by Ernest Mallick, OA, an ophthalmic assistant. The creation of this record is the provider's dictation and/or activities during the visit.    Electronically signed by: Ernest Mallick, OA 10.28.2020 12:50 PM   Gardiner Sleeper, M.D., Ph.D. Diseases & Surgery of the Retina and Vitreous [facility]  I have reviewed the above documentation for accuracy and completeness, and I agree with the above. Gardiner Sleeper, M.D., Ph.D. 10/06/19 12:50 PM    Abbreviations: M myopia (nearsighted); A astigmatism; H hyperopia (farsighted); P presbyopia; Mrx spectacle prescription;  CTL contact lenses; OD right eye; OS left eye; OU both eyes  XT exotropia; ET esotropia; PEK punctate epithelial keratitis; PEE punctate epithelial erosions;  DES dry eye syndrome; MGD meibomian gland dysfunction; ATs artificial tears; PFAT's preservative free artificial tears; [place] nuclear sclerotic cataract; PSC posterior subcapsular cataract; ERM epi-retinal membrane; PVD posterior vitreous detachment; RD retinal detachment; DM diabetes mellitus; DR diabetic retinopathy; NPDR non-proliferative diabetic retinopathy; PDR proliferative diabetic retinopathy; CSME clinically significant macular edema; DME diabetic macular edema; dbh dot blot hemorrhages; CWS cotton wool spot; POAG primary open angle glaucoma; C/D cup-to-disc ratio; HVF humphrey visual field; GVF goldmann visual field; OCT optical coherence tomography; IOP intraocular pressure; BRVO Branch retinal vein occlusion; CRVO central retinal vein occlusion; CRAO central retinal artery occlusion; BRAO branch retinal artery occlusion; RT retinal tear; SB scleral buckle; PPV pars plana vitrectomy; VH Vitreous hemorrhage; PRP panretinal laser photocoagulation; IVK intravitreal kenalog; VMT vitreomacular traction; MH Macular hole;  NVD neovascularization of the disc; NVE neovascularization elsewhere; AREDS age related eye disease study;  ARMD age related macular degeneration; POAG primary open angle glaucoma; EBMD epithelial/anterior basement membrane dystrophy; ACIOL anterior chamber intraocular lens; IOL intraocular lens; PCIOL posterior chamber intraocular lens; Phaco/IOL phacoemulsification with intraocular lens placement; PRK photorefractive keratectomy; LASIK laser assisted in situ keratomileusis; HTN hypertension; DM diabetes mellitus; COPD chronic obstructive pulmonary disease  

## 2019-10-13 ENCOUNTER — Encounter (INDEPENDENT_AMBULATORY_CARE_PROVIDER_SITE_OTHER): Payer: Medicare Other | Admitting: Ophthalmology

## 2019-10-28 NOTE — Progress Notes (Signed)
[facility] Note  11/02/2019     CHIEF COMPLAINT Patient presents for Retina Follow Up   HISTORY OF PRESENT ILLNESS: Chelsea Gallegos is a 85 y.o. female who presents to the clinic today for:   HPI    Retina Follow Up    Patient presents with  CRVO/BRVO.  In both eyes.  Severity is moderate.  Duration of 4 weeks.  Since onset it is gradually improving.  I, the attending physician,  performed the HPI with the patient and updated documentation appropriately.          Comments    Patient states vision improving OU. Using FML bid OU and preservative free cosopt tid OU. Hasn't check BS recently, not taking metformin or any other meds for BS control. Last a1c was 6.6 in November of last year.        Last edited by [name], MD on 11/02/2019  9:28 AM. (History)    pt states she feels like her vision has improved, she states Dr. [name] has referred her to Dr. Ander Slade, her appt with him is in December  Referring physician: [name], MD [place],  [place] [id number]  HISTORICAL INFORMATION:   Selected notes from the MEDICAL RECORD NUMBER Referred by Dr. [name] for concern of worsening CME OD    CURRENT MEDICATIONS: Current Outpatient Medications (Ophthalmic Drugs)  Medication Sig  . COSOPT PF 2-0.5 % SOLN ophthalmic solution Place 1 drop into both eyes 3 (three) times daily.  . fluorometholone (FML) 0.1 % ophthalmic suspension Place 1 drop into both eyes 2 (two) times daily.  . brimonidine (ALPHAGAN) 0.2 % ophthalmic solution Place 1 drop into both eyes 2 (two) times daily. (Patient not taking: Reported on 11/02/2019)  . dorzolamide (TRUSOPT) 2 % ophthalmic solution Place 1 drop into both eyes 2 (two) times daily.  . dorzolamide-timolol (COSOPT) 22.3-6.8 MG/ML ophthalmic solution Place 1 drop into both eyes 2 (two) times daily.  . prednisoLONE acetate (PRED FORTE) 1 % ophthalmic suspension INSTILL 1 DROP INTO  EACH EYE TWICE DAILY  . prednisoLONE acetate (PRED FORTE) 1 % ophthalmic suspension 1 drop 4 times daily in the left eye for 7 days then stop (Patient not taking: Reported on 11/02/2019)   No current facility-administered medications for this visit.  (Ophthalmic Drugs)   Current Outpatient Medications (Other)  Medication Sig  . apixaban (ELIQUIS) 5 MG TABS tablet Take 1 tablet (5 mg total) by mouth 2 (two) times daily.  Marland Kitchen aspirin 81 MG chewable tablet Chew 162 mg by mouth once.  . digoxin (LANOXIN) 0.125 MG tablet Take 1 tablet (0.125 mg total) by mouth every Monday, Wednesday, and Friday.  . diltiazem (CARDIZEM) 60 MG tablet Take 1 tablet (60 mg total) by mouth 2 (two) times daily.  Marland Kitchen atorvastatin (LIPITOR) 40 MG tablet Take 1 tablet (40 mg total) by mouth daily at 6 PM. (Patient not taking: Reported on 07/09/2019)  . metFORMIN (GLUCOPHAGE) 500 MG tablet Take 1 tablet (500 mg total) by mouth 2 (two) times daily with a meal. (Patient not taking: Reported on 07/09/2019)  . potassium chloride (K-DUR,KLOR-CON) 10 MEQ tablet Take 1 tablet (10 mEq total) by mouth 2 (two) times daily. (Patient not taking: Reported on 11/02/2019)   No current facility-administered medications for this visit.  (Other)      REVIEW OF SYSTEMS: ROS    Positive for: Gastrointestinal, Endocrine, Eyes   Negative for: Constitutional, Neurological,  Skin, Genitourinary, Musculoskeletal, HENT, Cardiovascular, Respiratory, Psychiatric, Allergic/Imm, Heme/Lymph   Last edited by Roselee Nova D, COT on 11/02/2019  8:43 AM. (History)       ALLERGIES No Known Allergies  PAST MEDICAL HISTORY Past Medical History:  Diagnosis Date  . Arthritis    "arms" (10/20/2018)  . Asthma   . Diabetic retinopathy ([place])    NPDR OU  . GERD (gastroesophageal reflux disease)   . Glaucoma    POAG OU  . History of gout   . History of hiatal hernia   . History of kidney stones   . Hyperlipidemia ~2005  . Hypertension   .  Hypertensive retinopathy    OU  . Refusal of blood transfusions as patient is Jehovah's Witness   . Stroke [facility]) 04/2017   "dr told me I had a stroke in my left eye" (10/20/2018)  . Type II diabetes mellitus ([place])    Past Surgical History:  Procedure Laterality Date  . CATARACT EXTRACTION Bilateral    [name]  . CATARACT EXTRACTION, BILATERAL Bilateral 2018  . CHOLECYSTECTOMY     pt does not recall this OR (10/20/2018)  . CYSTOSCOPY W/ STONE MANIPULATION    . EXCISIONAL HEMORRHOIDECTOMY  1980s/1990s   "& he cut my muscle that controls my bowels"  . EYE SURGERY    . FRACTURE SURGERY    . GLAUCOMA SURGERY Bilateral 2018  . HERNIA REPAIR    . KNEE ARTHROSCOPY Left 04/2012    WITH MEDIAL MENISECTOMY/notes 04/16/2012  . LEFT HEART CATH AND CORONARY ANGIOGRAPHY N/A 10/21/2018   Procedure: LEFT HEART CATH AND CORONARY ANGIOGRAPHY;  Surgeon: Dixie Dials, MD;  Location: [facility];  Service: Cardiovascular;  Laterality: N/A;  . PATELLA FRACTURE SURGERY Right ~ 2006  . UMBILICAL HERNIA REPAIR  2000s    FAMILY HISTORY Family History  Problem Relation Age of Onset  . Hypertension Mother   . Hypertension Father   . Breast cancer Daughter     SOCIAL HISTORY Social History   Tobacco Use  . Smoking status: Former Smoker    Packs/day: 3.00    Years: 20.00    Pack years: 60.00    Types: Cigarettes    Quit date: 12/09/1972    Years since quitting: 46.9  . Smokeless tobacco: Never Used  Substance Use Topics  . Alcohol use: Yes    Alcohol/week: 1.0 standard drinks    Types: 1 Standard drinks or equivalent per week  . Drug use: Never         OPHTHALMIC EXAM:  Base Eye Exam    Visual Acuity (Snellen - Linear)      Right Left   Dist [place] 20/50 +2 20/80 +2   Dist ph [place] 20/30 +2 20/60 +2       Tonometry (Tonopen, 8:55 AM)      Right Left   Pressure 15 14       Pupils      Dark Light Shape React APD   Right 3 2 Round Minimal None   Left 3 2 Round Minimal None        Visual Fields      Left Right   Restrictions Partial outer superior temporal, inferior temporal, superior nasal, inferior nasal deficiencies Partial outer superior temporal, inferior temporal, superior nasal, inferior nasal deficiencies       Extraocular Movement      Right Left    Full, Ortho Full, Ortho       Neuro/Psych  Oriented x3: Yes   Mood/Affect: Normal       Dilation    Both eyes: 1.0% Mydriacyl, 2.5% Phenylephrine @ 8:55 AM        Slit Lamp and Fundus Exam    Slit Lamp Exam      Right Left   Lids/Lashes UL edema, Ptosis UL edema, Ptosis   Conjunctiva/Sclera mild Melanosis mild Melanosis   Cornea Arcus, 1+ Punctate epithelial erosions, Well healed temporal cataract wounds Arcus, 2+ Punctate epithelial erosions, Well healed cataract wounds   Anterior Chamber deep and clear, cypass device at 0300 deep and clear, cypass device at 0800   Iris Round and dilated, No NVI Round and dilated, No NVI   Lens PC IOL in good position PC IOL in good position   Vitreous Vitreous syneresis, Posterior vitreous detachment, vitreous condensations Vitreous syneresis, vitreous condensations       Fundus Exam      Right Left   Disc +central cupping, 3-4+pallor, +heme at 0700, temporal PPP, very thin superior rim +cupping, 3-4+pallor, non-existent inferior rim, temporal PPP   C/D Ratio 0.9 0.95   Macula Flat, blunted foveal reflex, central CME, interval improvement in edema, scattered MA inferior macula - improving, no heme Blunted foveal reflex, RPE mottling and clumping, Cystic changes - persistent inferior macula, focal central SRF, mild exudates temporal macula, mild ERM   Vessels Attenuated, Tortuous, AV crossing changes, superior veins slightly dilated Attenuated, Tortuous, scleratic vessels temporal periphery   Periphery attached, scattered RPE changes/peripheral drusen, mild IRH/DBH inferior hemishphere - improving Attached, scattered RPE changes, sectoral PRP scars superiorly  w/ good fill in, sclerotic vessels temporal periphery        Refraction    Manifest Refraction      Sphere Cylinder Axis Dist VA   Right -0.25 +0.50 035 20/40+1   Left -0.25 +0.50 140 20/70          IMAGING AND PROCEDURES  Imaging and Procedures for @TODAY @  OCT, Retina - OU - Both Eyes       Right Eye Quality was good. Central Foveal Thickness: 533. Progression has improved. Findings include epiretinal membrane, macular pucker, abnormal foveal contour, intraretinal fluid, subretinal fluid (Interval improvement in IRF -- still severe).   Left Eye Quality was good. Central Foveal Thickness: 335. Progression has worsened. Findings include intraretinal fluid, outer retinal atrophy, epiretinal membrane, intraretinal hyper-reflective material, macular pucker, subretinal fluid, normal foveal contour (Interval increase in IRF inferior macula; interval improvement in SRF).   Notes *Images captured and stored on drive  Diagnosis / Impression:  HRVO w/ CME OU OD: Interval improvement in IRF -- still severe OS: Interval increase in IRF inferior macula; interval improvement in SRF   Clinical management:  See below  Abbreviations: NFP - Normal foveal profile. CME - cystoid macular edema. PED - pigment epithelial detachment. IRF - intraretinal fluid. SRF - subretinal fluid. EZ - ellipsoid zone. ERM - epiretinal membrane. ORA - outer retinal atrophy. ORT - outer retinal tubulation. SRHM - subretinal hyper-reflective material        Intravitreal Injection, Pharmacologic Agent - OD - Right Eye       Time Out 11/02/2019. 8:56 AM. Confirmed correct patient, procedure, site, and patient consented.   Anesthesia Topical anesthesia was used. Anesthetic medications included Lidocaine 2%, Proparacaine 0.5%.   Procedure Preparation included 5% betadine to ocular surface, eyelid speculum. A supplied needle was used.   Injection:  1.25 mg Bevacizumab (AVASTIN) SOLN   NDC: [id number],  Lot: [id number] ,  Expiration date: 12/15/2019   Route: Intravitreal, Site: Right Eye, Waste: 0 mL  Post-op Post injection exam found visual acuity of at least counting fingers. The patient tolerated the procedure well. There were no complications. The patient received written and verbal post procedure care education.   Notes An AC tap was performed following injection due to elevated IOP using a 30 gauge needle on a syringe with the plunger removed. The needle was placed at the limbus at 5 oclock and approximately 0.07 cc of aqueous was removed from the anterior chamber. Betadine was applied to the tap area before and after the paracentesis was performed. There were no complications. The patient tolerated the procedure well. The IOP was rechecked and was found to be ~10 mmHg by palpation.        Intravitreal Injection, Pharmacologic Agent - OS - Left Eye       Time Out 11/02/2019. 9:08 AM. Confirmed correct patient, procedure, site, and patient consented.   Anesthesia Topical anesthesia was used. Anesthetic medications included Lidocaine 2%, Proparacaine 0.5%.   Procedure Preparation included 5% betadine to ocular surface, eyelid speculum. A 30 gauge needle was used.   Injection:  1.25 mg Bevacizumab (AVASTIN) SOLN   NDC: [id number], Lot: [id number] , Expiration date: 01/14/2020   Route: Intravitreal, Site: Left Eye, Waste: 0 mL  Post-op Post injection exam found visual acuity of at least counting fingers. The patient tolerated the procedure well. There were no complications. The patient received written and verbal post procedure care education.   Notes An AC tap was performed following injection due to elevated IOP using a 30 gauge needle on a syringe with the plunger removed. The needle was placed at the limbus at 5 oclock and approximately 0.08 cc of aqueous was removed from the anterior chamber. Betadine was applied to the tap area before and after the paracentesis was  performed. There were no complications. The patient tolerated the procedure well. The IOP was rechecked and was found to be ~8 mmHg by palpation.                 ASSESSMENT/PLAN:    ICD-10-CM   1. Branch retinal vein occlusion of both eyes with macular edema  H34.8330 Intravitreal Injection, Pharmacologic Agent - OD - Right Eye    Intravitreal Injection, Pharmacologic Agent - OS - Left Eye    Bevacizumab (AVASTIN) SOLN 1.25 mg    Bevacizumab (AVASTIN) SOLN 1.25 mg  2. Retinal edema  H35.81 OCT, Retina - OU - Both Eyes  3. Primary open angle glaucoma (POAG) of both eyes, severe stage  H40.1133   4. Allergic conjunctivitis of both eyes  H10.13   5. Moderate nonproliferative diabetic retinopathy of both eyes with macular edema associated with type 2 diabetes mellitus ([place])  [address]   6. Essential hypertension  I10   7. Hypertensive retinopathy of both eyes  H35.033   8. Pseudophakia of both eyes  Z96.1     1,2. HRVO w/ CME OU (OD > OS)  - OD--remote inferior HRVO w/ CME  - OS--remote superior HRVO w/ CME  - pt reports history of previous injections and segmental PRP laser at [facility] "years ago" -- able to obtain records  - last injection per pt history, Dr. [facility] in 2015  - s/p superior segmental PRP OU by [facility]  - FA (03.16.20) shows leakage patterns consistent with HRVO w/ CME bilaterally -- OS also with significant capillary nonperfusion temporally -- may benefit from segmental fill in PRP  -  s/p IVA OD #1 (03.16.20), #2 (05.22.20), #3 (06.26.20), #4 (08.28.20), #5 (10.28.20)  - s/p IVA OS #1 (03.16.20), #2 (05.22.20), #3 (06.26.20), #4 (07.31.20), #5 (08.28.20), #6 (10.28.20)  - s/p segmental fill in PRP OS (07.02.20)  - BCVA 20/30 OD, 20/60 OS  - OCT shows interval improvement in IRF OD; persistent cystic changes OS  - recommend IVA OD #6 and OS #7, today (11.24.20)  - RBA of procedure discussed, questions answered  - informed consent obtained  and signed  - see procedure note -- AC taps done OU immediately post injection to lower IOP  - F/U 4 wks -- DFE/OCT/possible injection(s)  3. POAG -- severe stage OU  - history of CEIOL w/ [place] OU by [name]  - c/d 0.9 OD, 0.95 OS  - now under the expert management of Dr. [name]  - target IOPs -- low teens OU  - significant history of drop intolerance to various anti-ocular hypertensive meds  - now on preservative-free Cosopt BID OU and IOP 17 and 14 today  - pt currently on PF Cosopt TID OU  - pt has appt with Dr. Ander Slade in December for [facility] consult -- referred by Dr. [name]  4. Allergic conjunctivitis / brimonidine toxicity OU -- resolved  - bilateral irritation, photophobia and injection on brimonidine  - resolved following d/c  - doing well on PF Cosopt  5. Moderate nonproliferative diabetic retinopathy OU  - The incidence, risk factors for progression, natural history and treatment options for diabetic retinopathy were discussed with patient.    - The need for close monitoring of blood glucose, blood pressure, and serum lipids, avoiding cigarette or any type of tobacco, and the need for long term follow up was also discussed with patient.  - diabetic retinopathy confounded by HRVOs described above, but may be contributing to macular edema  - last A1c 6.6 on 11.13.19  - monitor  6,7. Hypertensive retinopathy OU  - discussed importance of tight BP control  - monitor  8. Pseudophakia OU  - s/p CE/IOL w/ [place] OU - Dr. Jenene Slicker (2018)  - doing well  - monitor   Ophthalmic Meds Ordered this visit:  Meds ordered this encounter  Medications  . Bevacizumab (AVASTIN) SOLN 1.25 mg  . Bevacizumab (AVASTIN) SOLN 1.25 mg       Return in about 4 weeks (around 11/30/2019) for f/u HRVO OU, DFE, OCT.  There are no Patient Instructions on file for this visit.   Explained the diagnoses, plan, and follow up with the patient and they expressed understanding.  Patient expressed  understanding of the importance of proper follow up care.   This document serves as a record of services personally performed by Gardiner Sleeper, MD, PhD. It was created on their behalf by Estill Bakes, COT an ophthalmic technician. The creation of this record is the provider's dictation and/or activities during the visit.    Electronically signed by: Estill Bakes, COT 10/28/19 @ 12:51 PM  Gardiner Sleeper, M.D., Ph.D. Diseases & Surgery of the Retina and Plain 11/02/2019   I have reviewed the above documentation for accuracy and completeness, and I agree with the above. Gardiner Sleeper, M.D., Ph.D. 11/02/19 12:51 PM    Abbreviations: M myopia (nearsighted); A astigmatism; H hyperopia (farsighted); P presbyopia; Mrx spectacle prescription;  CTL contact lenses; OD right eye; OS left eye; OU both eyes  XT exotropia; ET esotropia; PEK punctate epithelial keratitis; PEE punctate epithelial  erosions; DES dry eye syndrome; MGD meibomian gland dysfunction; ATs artificial tears; PFAT's preservative free artificial tears; [place] nuclear sclerotic cataract; PSC posterior subcapsular cataract; ERM epi-retinal membrane; PVD posterior vitreous detachment; RD retinal detachment; DM diabetes mellitus; DR diabetic retinopathy; NPDR non-proliferative diabetic retinopathy; PDR proliferative diabetic retinopathy; CSME clinically significant macular edema; DME diabetic macular edema; dbh dot blot hemorrhages; CWS cotton wool spot; POAG primary open angle glaucoma; C/D cup-to-disc ratio; HVF humphrey visual field; GVF goldmann visual field; OCT optical coherence tomography; IOP intraocular pressure; BRVO Branch retinal vein occlusion; CRVO central retinal vein occlusion; CRAO central retinal artery occlusion; BRAO branch retinal artery occlusion; RT retinal tear; SB scleral buckle; PPV pars plana vitrectomy; VH Vitreous hemorrhage; PRP panretinal laser photocoagulation; IVK  intravitreal kenalog; VMT vitreomacular traction; MH Macular hole;  NVD neovascularization of the disc; NVE neovascularization elsewhere; AREDS age related eye disease study; ARMD age related macular degeneration; POAG primary open angle glaucoma; EBMD epithelial/anterior basement membrane dystrophy; ACIOL anterior chamber intraocular lens; IOL intraocular lens; PCIOL posterior chamber intraocular lens; Phaco/IOL phacoemulsification with intraocular lens placement; [place] photorefractive keratectomy; LASIK laser assisted in situ keratomileusis; HTN hypertension; DM diabetes mellitus; COPD chronic obstructive pulmonary disease

## 2019-11-02 ENCOUNTER — Ambulatory Visit (INDEPENDENT_AMBULATORY_CARE_PROVIDER_SITE_OTHER): Payer: Medicare Other | Admitting: Ophthalmology

## 2019-11-02 ENCOUNTER — Encounter (INDEPENDENT_AMBULATORY_CARE_PROVIDER_SITE_OTHER): Payer: Self-pay | Admitting: Ophthalmology

## 2019-11-02 DIAGNOSIS — E113313 Type 2 diabetes mellitus with moderate nonproliferative diabetic retinopathy with macular edema, bilateral: Secondary | ICD-10-CM

## 2019-11-02 DIAGNOSIS — H1013 Acute atopic conjunctivitis, bilateral: Secondary | ICD-10-CM

## 2019-11-02 DIAGNOSIS — H35033 Hypertensive retinopathy, bilateral: Secondary | ICD-10-CM

## 2019-11-02 DIAGNOSIS — H3581 Retinal edema: Secondary | ICD-10-CM

## 2019-11-02 DIAGNOSIS — H34833 Tributary (branch) retinal vein occlusion, bilateral, with macular edema: Secondary | ICD-10-CM

## 2019-11-02 DIAGNOSIS — Z961 Presence of intraocular lens: Secondary | ICD-10-CM

## 2019-11-02 DIAGNOSIS — I10 Essential (primary) hypertension: Secondary | ICD-10-CM

## 2019-11-02 DIAGNOSIS — H401133 Primary open-angle glaucoma, bilateral, severe stage: Secondary | ICD-10-CM

## 2019-11-02 MED ORDER — BEVACIZUMAB CHEMO INJECTION 1.25MG/0.05ML SYRINGE FOR KALEIDOSCOPE
1.2500 mg | INTRAVITREAL | Status: AC | PRN
  Administered 2019-11-02: 13:00:00 1.25 mg via INTRAVITREAL

## 2019-11-03 ENCOUNTER — Encounter (INDEPENDENT_AMBULATORY_CARE_PROVIDER_SITE_OTHER): Payer: Medicare Other | Admitting: Ophthalmology

## 2019-11-30 ENCOUNTER — Encounter (INDEPENDENT_AMBULATORY_CARE_PROVIDER_SITE_OTHER): Payer: Medicare Other | Admitting: Ophthalmology

## 2019-12-14 ENCOUNTER — Encounter (INDEPENDENT_AMBULATORY_CARE_PROVIDER_SITE_OTHER): Payer: Medicare Other | Admitting: Ophthalmology

## 2019-12-24 DIAGNOSIS — E113219 Type 2 diabetes mellitus with mild nonproliferative diabetic retinopathy with macular edema, unspecified eye: Secondary | ICD-10-CM | POA: Diagnosis not present

## 2019-12-24 DIAGNOSIS — H401133 Primary open-angle glaucoma, bilateral, severe stage: Secondary | ICD-10-CM | POA: Diagnosis not present

## 2019-12-24 DIAGNOSIS — H209 Unspecified iridocyclitis: Secondary | ICD-10-CM | POA: Diagnosis not present

## 2019-12-29 ENCOUNTER — Ambulatory Visit: Payer: Medicare PPO | Attending: Internal Medicine

## 2019-12-29 DIAGNOSIS — Z23 Encounter for immunization: Secondary | ICD-10-CM | POA: Insufficient documentation

## 2019-12-29 NOTE — Progress Notes (Signed)
   Covid-19 Vaccination Clinic  Name:  Chelsea Gallegos    MRN: [id number] DOB: 1934/06/09  12/29/2019  Chelsea Gallegos was observed post Covid-19 immunization for 15 minutes without incidence. She was provided with Vaccine Information Sheet and instruction to access the V-Safe system.   Chelsea Gallegos was instructed to call 911 with any severe reactions post vaccine: Marland Kitchen Difficulty breathing  . Swelling of your face and throat  . A fast heartbeat  . A bad rash all over your body  . Dizziness and weakness    Immunizations Administered    Name Date Dose VIS Date Route   Pfizer COVID-19 Vaccine 12/29/2019  2:17 PM 0.3 mL 11/19/2019 Intramuscular   Manufacturer: [place]   Lot: [id number]   [place]: [id number]

## 2020-01-16 ENCOUNTER — Ambulatory Visit: Payer: Medicare PPO | Attending: Internal Medicine

## 2020-01-16 DIAGNOSIS — Z23 Encounter for immunization: Secondary | ICD-10-CM | POA: Insufficient documentation

## 2020-01-16 NOTE — Progress Notes (Signed)
   Covid-19 Vaccination Clinic  Name:  [name]    MRN: [id number] DOB: Sep 20, 1934  01/16/2020  [name] was observed post Covid-19 immunization for 15 minutes without incidence. She was provided with Vaccine Information Sheet and instruction to access the V-Safe system.   [name] was instructed to call 911 with any severe reactions post vaccine: Marland Kitchen Difficulty breathing  . Swelling of your face and throat  . A fast heartbeat  . A bad rash all over your body  . Dizziness and weakness    Immunizations Administered    Name Date Dose VIS Date Route   Pfizer COVID-19 Vaccine 01/16/2020  9:03 AM 0.3 mL 11/19/2019 Intramuscular   Manufacturer: [place]   Lot: [id number]   [place]: [id number]

## 2020-04-06 DIAGNOSIS — H30031 Focal chorioretinal inflammation, peripheral, right eye: Secondary | ICD-10-CM | POA: Diagnosis not present

## 2020-04-06 DIAGNOSIS — H401132 Primary open-angle glaucoma, bilateral, moderate stage: Secondary | ICD-10-CM | POA: Diagnosis not present

## 2020-04-06 DIAGNOSIS — Z961 Presence of intraocular lens: Secondary | ICD-10-CM | POA: Diagnosis not present

## 2020-04-06 DIAGNOSIS — H3581 Retinal edema: Secondary | ICD-10-CM | POA: Diagnosis not present

## 2020-04-06 DIAGNOSIS — E113311 Type 2 diabetes mellitus with moderate nonproliferative diabetic retinopathy with macular edema, right eye: Secondary | ICD-10-CM | POA: Diagnosis not present

## 2020-04-26 DIAGNOSIS — H401133 Primary open-angle glaucoma, bilateral, severe stage: Secondary | ICD-10-CM | POA: Diagnosis not present

## 2020-04-26 DIAGNOSIS — E113219 Type 2 diabetes mellitus with mild nonproliferative diabetic retinopathy with macular edema, unspecified eye: Secondary | ICD-10-CM | POA: Diagnosis not present

## 2020-04-28 DIAGNOSIS — Z961 Presence of intraocular lens: Secondary | ICD-10-CM | POA: Diagnosis not present

## 2020-04-28 DIAGNOSIS — H3581 Retinal edema: Secondary | ICD-10-CM | POA: Diagnosis not present

## 2020-04-28 DIAGNOSIS — H401132 Primary open-angle glaucoma, bilateral, moderate stage: Secondary | ICD-10-CM | POA: Diagnosis not present

## 2020-04-28 DIAGNOSIS — H30031 Focal chorioretinal inflammation, peripheral, right eye: Secondary | ICD-10-CM | POA: Diagnosis not present

## 2020-04-28 DIAGNOSIS — E113311 Type 2 diabetes mellitus with moderate nonproliferative diabetic retinopathy with macular edema, right eye: Secondary | ICD-10-CM | POA: Diagnosis not present

## 2020-06-13 DIAGNOSIS — H3581 Retinal edema: Secondary | ICD-10-CM | POA: Diagnosis not present

## 2020-06-13 DIAGNOSIS — E113311 Type 2 diabetes mellitus with moderate nonproliferative diabetic retinopathy with macular edema, right eye: Secondary | ICD-10-CM | POA: Diagnosis not present

## 2020-06-13 DIAGNOSIS — H30031 Focal chorioretinal inflammation, peripheral, right eye: Secondary | ICD-10-CM | POA: Diagnosis not present

## 2020-06-13 DIAGNOSIS — Z79899 Other long term (current) drug therapy: Secondary | ICD-10-CM | POA: Diagnosis not present

## 2020-06-13 DIAGNOSIS — H401132 Primary open-angle glaucoma, bilateral, moderate stage: Secondary | ICD-10-CM | POA: Diagnosis not present

## 2020-06-13 DIAGNOSIS — Z961 Presence of intraocular lens: Secondary | ICD-10-CM | POA: Diagnosis not present

## 2020-08-01 DIAGNOSIS — Z79899 Other long term (current) drug therapy: Secondary | ICD-10-CM | POA: Diagnosis not present

## 2020-08-01 DIAGNOSIS — E113311 Type 2 diabetes mellitus with moderate nonproliferative diabetic retinopathy with macular edema, right eye: Secondary | ICD-10-CM | POA: Diagnosis not present

## 2020-08-01 DIAGNOSIS — H401132 Primary open-angle glaucoma, bilateral, moderate stage: Secondary | ICD-10-CM | POA: Diagnosis not present

## 2020-08-01 DIAGNOSIS — Z961 Presence of intraocular lens: Secondary | ICD-10-CM | POA: Diagnosis not present

## 2020-08-01 DIAGNOSIS — H3581 Retinal edema: Secondary | ICD-10-CM | POA: Diagnosis not present

## 2020-08-01 DIAGNOSIS — H30031 Focal chorioretinal inflammation, peripheral, right eye: Secondary | ICD-10-CM | POA: Diagnosis not present

## 2020-08-02 DIAGNOSIS — H30031 Focal chorioretinal inflammation, peripheral, right eye: Secondary | ICD-10-CM | POA: Diagnosis not present

## 2020-08-02 DIAGNOSIS — Z79899 Other long term (current) drug therapy: Secondary | ICD-10-CM | POA: Diagnosis not present

## 2020-08-15 DIAGNOSIS — Z79899 Other long term (current) drug therapy: Secondary | ICD-10-CM | POA: Diagnosis not present

## 2020-08-17 NOTE — Progress Notes (Signed)
[facility] Note  08/18/2020     CHIEF COMPLAINT Patient presents for Retina Follow Up   HISTORY OF PRESENT ILLNESS: Chelsea Gallegos is a 86 y.o. female who presents to the clinic today for:   HPI    Retina Follow Up    Patient presents with  Other.  In both eyes.  This started 10 months ago.  Severity is moderate.  I, the attending physician,  performed the HPI with the patient and updated documentation appropriately.          Comments    Patient here for 10 months retina follow up for HRVO with CME OU. Patient states vision about the same. No eye pain.        Last edited by [name], MD on 08/18/2020 11:41 AM. (History)    Pt states she feels like her vision has improved, she states Dr. [name] has referred her to Dr. Ander Slade, her appt with him is in December  Referring physician: Feliz Gallegos, [place],  [place] [address]  HISTORICAL INFORMATION:   Selected notes from the MEDICAL RECORD NUMBER Referred by Dr. [name] for concern of worsening CME OD    CURRENT MEDICATIONS: Current Outpatient Medications (Ophthalmic Drugs)  Medication Sig  . COSOPT PF 2-0.5 % SOLN ophthalmic solution Place 1 drop into both eyes 3 (three) times daily.  . dorzolamide (TRUSOPT) 2 % ophthalmic solution Place 1 drop into both eyes 2 (two) times daily.  . dorzolamide-timolol (COSOPT) 22.3-6.8 MG/ML ophthalmic solution Place 1 drop into both eyes 2 (two) times daily.  . fluorometholone (FML) 0.1 % ophthalmic suspension Place 1 drop into both eyes 2 (two) times daily.  . prednisoLONE acetate (PRED FORTE) 1 % ophthalmic suspension INSTILL 1 DROP INTO EACH EYE TWICE DAILY  . prednisoLONE acetate (PRED FORTE) 1 % ophthalmic suspension 1 drop 4 times daily in the left eye for 7 days then stop (Patient not taking: Reported on 11/02/2019)   No current facility-administered medications for this visit. (Ophthalmic Drugs)   Current Outpatient  Medications (Other)  Medication Sig  . apixaban (ELIQUIS) 5 MG TABS tablet Take 1 tablet (5 mg total) by mouth 2 (two) times daily.  Marland Kitchen aspirin 81 MG chewable tablet Chew 162 mg by mouth once.  Marland Kitchen atorvastatin (LIPITOR) 40 MG tablet Take 1 tablet (40 mg total) by mouth daily at 6 PM. (Patient not taking: Reported on 07/09/2019)  . digoxin (LANOXIN) 0.125 MG tablet Take 1 tablet (0.125 mg total) by mouth every Monday, Wednesday, and Friday.  . diltiazem (CARDIZEM) 60 MG tablet Take 1 tablet (60 mg total) by mouth 2 (two) times daily.  . metFORMIN (GLUCOPHAGE) 500 MG tablet Take 1 tablet (500 mg total) by mouth 2 (two) times daily with a meal. (Patient not taking: Reported on 07/09/2019)  . potassium chloride (K-DUR,KLOR-CON) 10 MEQ tablet Take 1 tablet (10 mEq total) by mouth 2 (two) times daily. (Patient not taking: Reported on 11/02/2019)   No current facility-administered medications for this visit. (Other)      REVIEW OF SYSTEMS: ROS    Positive for: Gastrointestinal, Endocrine, Eyes   Negative for: Constitutional, Neurological, Skin, Genitourinary, Musculoskeletal, HENT, Cardiovascular, Respiratory, Psychiatric, Allergic/Imm, Heme/Lymph   Last edited by Theodore Demark, COA on 08/18/2020  9:26 AM. (History)       ALLERGIES No Known Allergies  PAST MEDICAL HISTORY Past Medical History:  Diagnosis Date  . Arthritis    "  arms" (10/20/2018)  . Asthma   . Diabetic retinopathy ([place])    NPDR OU  . GERD (gastroesophageal reflux disease)   . Glaucoma    POAG OU  . History of gout   . History of hiatal hernia   . History of kidney stones   . Hyperlipidemia ~2005  . Hypertension   . Hypertensive retinopathy    OU  . Refusal of blood transfusions as patient is Jehovah's Witness   . Stroke [facility]) 04/2017   "dr told me I had a stroke in my left eye" (10/20/2018)  . Type II diabetes mellitus ([place])    Past Surgical History:  Procedure Laterality Date  . CATARACT EXTRACTION  Bilateral    [name]  . CATARACT EXTRACTION, BILATERAL Bilateral 2018  . CHOLECYSTECTOMY     pt does not recall this OR (10/20/2018)  . CYSTOSCOPY W/ STONE MANIPULATION    . EXCISIONAL HEMORRHOIDECTOMY  1980s/1990s   "& he cut my muscle that controls my bowels"  . EYE SURGERY    . FRACTURE SURGERY    . GLAUCOMA SURGERY Bilateral 2018  . HERNIA REPAIR    . KNEE ARTHROSCOPY Left 04/2012    WITH MEDIAL MENISECTOMY/notes 04/16/2012  . LEFT HEART CATH AND CORONARY ANGIOGRAPHY N/A 10/21/2018   Procedure: LEFT HEART CATH AND CORONARY ANGIOGRAPHY;  Surgeon: Dixie Dials, MD;  Location: [facility];  Service: Cardiovascular;  Laterality: N/A;  . PATELLA FRACTURE SURGERY Right ~ 2006  . UMBILICAL HERNIA REPAIR  2000s    FAMILY HISTORY Family History  Problem Relation Age of Onset  . Hypertension Mother   . Hypertension Father   . Breast cancer Daughter     SOCIAL HISTORY Social History   Tobacco Use  . Smoking status: Former Smoker    Packs/day: 3.00    Years: 20.00    Pack years: 60.00    Types: Cigarettes    Quit date: 12/09/1972    Years since quitting: 47.7  . Smokeless tobacco: Never Used  Vaping Use  . Vaping Use: Never used  Substance Use Topics  . Alcohol use: Yes    Alcohol/week: 1.0 standard drink    Types: 1 Standard drinks or equivalent per week  . Drug use: Never         OPHTHALMIC EXAM:  Base Eye Exam    Visual Acuity (Snellen - Linear)      Right Left   Dist [place] 20/60 -2 20/60 -2   Dist ph [place] 20/30 -2 20/50 -1       Tonometry (Tonopen, 9:22 AM)      Right Left   Pressure 17 18       Pupils      Dark Light Shape React APD   Right 3 2 Round Minimal None   Left 3 2 Round Minimal None       Visual Fields      Left Right   Restrictions Partial outer superior temporal, inferior temporal, superior nasal, inferior nasal deficiencies Partial outer superior temporal, inferior temporal, superior nasal, inferior nasal deficiencies        Extraocular Movement      Right Left    Full, Ortho Full, Ortho       Neuro/Psych    Oriented x3: Yes   Mood/Affect: Normal       Dilation    Both eyes: 1.0% Mydriacyl, 2.5% Phenylephrine @ 9:22 AM        Slit Lamp and Fundus Exam  Slit Lamp Exam      Right Left   Lids/Lashes Dermato, mild Ptosis Dermato,  mild Ptosis   Conjunctiva/Sclera mild Melanosis mild Melanosis   Cornea Arcus, 2+ inferior Punctate epithelial erosions, Well healed temporal cataract wounds Arcus, 2+ Punctate epithelial erosions, Well healed cataract wounds   Anterior Chamber deep and clear, cypass device at 0300 deep and clear, cypass device at 0800   Iris Round and dilated, No NVI Round and dilated, No NVI   Lens PC IOL in good position PC IOL in good position   Vitreous Vitreous syneresis, Posterior vitreous detachment, vitreous condensations Vitreous syneresis, vitreous condensations       Fundus Exam      Right Left   Disc +central cupping, 3-4+pallor, +heme at 0700, temporal PPP, very thin superior rim +cupping, 3-4+pallor, non-existent inferior rim, temporal PPP   C/D Ratio 0.9 0.95   Macula Flat, blunted foveal reflex, central CM, scattered MA inferior macula Blunted foveal reflex, RPE mottling and clumping, Cystic changes - persistent inferior macula, mild ERM, Scattered IRH superior macula   Vessels Attenuated, Tortuous, AV crossing changes, superior veins slightly dilated Attenuated, Tortuous, scleratic vessels temporal periphery, av crossing changes, +copper wiring   Periphery attached, scattered RPE changes/peripheral drusen, mild IRH/DBH inferior hemishphere Attached, scattered RPE changes, sectoral PRP scars superiorly w/ good fill in, sclerotic vessels temporal periphery, mild MAs superiorly          IMAGING AND PROCEDURES  Imaging and Procedures for @TODAY @  OCT, Retina - OU - Both Eyes       Right Eye Quality was good. Central Foveal Thickness: 563. Progression has worsened.  Findings include epiretinal membrane, macular pucker, abnormal foveal contour, intraretinal fluid, no SRF (Interval increase in IRF ).   Left Eye Quality was good. Central Foveal Thickness: 251. Progression has worsened. Findings include intraretinal fluid, outer retinal atrophy, epiretinal membrane, intraretinal hyper-reflective material, macular pucker, normal foveal contour, no SRF (Mild cystic changes improved from prior).   Notes *Images captured and stored on drive  Diagnosis / Impression:  HRVO w/ CME OU OD: Interval improvement in IRF -- still severe OS: Mild cystic changes improved from prior  Clinical management:  See below  Abbreviations: NFP - Normal foveal profile. CME - cystoid macular edema. PED - pigment epithelial detachment. IRF - intraretinal fluid. SRF - subretinal fluid. EZ - ellipsoid zone. ERM - epiretinal membrane. ORA - outer retinal atrophy. ORT - outer retinal tubulation. SRHM - subretinal hyper-reflective material        Fluorescein Angiography Optos (Transit OD)       Right Eye   Progression has been stable. Early phase findings include delayed filling, microaneurysm, leakage (Delayed venous return). Mid/Late phase findings include microaneurysm, leakage (Central petalloid hyperflourescent leakage.  Scattered leakage inferior hemisphere.).   Left Eye   Progression has been stable. Early phase findings include microaneurysm, vascular perfusion defect, leakage, staining. Mid/Late phase findings include staining, leakage, microaneurysm, vascular perfusion defect (Enlarged FAZ and central petalloid hyperfloursecent leakage.).   Notes Images stored on drive;   Impression: OD: Central petalloid hyperflourescent leakage.  Scattered leakage inferior hemisphere. OS: Enlarged FAZ and central petalloid hyperfloursecent leakage.                  ASSESSMENT/PLAN:    ICD-10-CM   1. Branch retinal vein occlusion of both eyes with macular edema   H34.8330   2. Retinal edema  H35.81 OCT, Retina - OU - Both Eyes  3. Chorioretinal inflammation of  both eyes  H30.93   4. Primary open angle glaucoma (POAG) of both eyes, severe stage  H40.1133   5. Allergic conjunctivitis of both eyes  H10.13   6. Moderate nonproliferative diabetic retinopathy of both eyes with macular edema associated with type 2 diabetes mellitus ([place])  X32.4401   7. Essential hypertension  I10   8. Hypertensive retinopathy of both eyes  H35.033 Fluorescein Angiography Optos (Transit OD)  9. Pseudophakia of both eyes  Z96.1     1,2. HRVO w/ CME OU (OD > OS)  - pt has been lost to f/u here since 10/2019 (10 mos)  - OD--remote inferior HRVO w/ CME  - OS--remote superior HRVO w/ CME  - pt reports history of previous injections and segmental PRP laser at [facility] "years ago"   - last injection prior to coming here, per pt history, Dr. Adrian Saran in 2015  - s/p superior segmental PRP OU by [facility]             - Was referred to Dr. Manuella Ghazi in April 2021 by Dr. Ander Slade for chorioretinal inflammation OU (see below)  - FA (9.10.21) shows central petalloid hyperflourescent leakage.  Scattered leakage inferior hemisphere OD.  OS: Enlarged FAZ and central petalloid hyperfloursecent leakage + scattered leakage superior hemisphere.  - s/p IVA OD #1 (03.16.20), #2 (05.22.20), #3 (06.26.20), #4 (08.28.20), #5 (10.28.20), #6 (11.24.20)  - s/p IVA OS #1 (03.16.20), #2 (05.22.20), #3 (06.26.20), #4 (07.31.20), #5 (08.28.20), #6 (10.28.20), #7 (11.24.20)  - s/p segmental fill in PRP OS (07.02.20)  - BCVA 20/30 OD, 20/50 OS  - OCT shows CME OU (OD > OS)  - pt and family wish to consolidate eye care -- will defer retinal care to Dr. Manuella Ghazi  - Will share data from today's exam w/ Dr. Manuella Ghazi, who can continue pt's injections if needed  - F/u here prn  3. Chorioretinal inflammation OU  - now under the expert management of Dr. Manuella Ghazi, as of April 2021  - currently on azathioprine  and tacrolimus  - FA 9.10.21 shows hemispheric leakage and central CME OU -- OD inf hemisphere, OS sup hemisphere  - scheduled to f/u w/ Manuella Ghazi in November  4. POAG -- severe stage OU  - history of CEIOL w/ [place] OU by [name]  - c/d 0.9 OD, 0.95 OS  - now under the expert management of Dr. Ander Slade  - target IOPs -- low teens OU  - significant history of drop intolerance to various anti-ocular hypertensive meds  - now on preservative-free Cosopt BID OU and IOP 17 OD and 18 OS today  5. Allergic conjunctivitis / brimonidine toxicity OU -- resolved  - bilateral irritation, photophobia and injection on brimonidine  - resolved following d/c  - doing well on PF Cosopt  6. Moderate nonproliferative diabetic retinopathy OU  - The incidence, risk factors for progression, natural history and treatment options for diabetic retinopathy were discussed with patient.    - The need for close monitoring of blood glucose, blood pressure, and serum lipids, avoiding cigarette or any type of tobacco, and the need for long term follow up was also discussed with patient.  - diabetic retinopathy confounded by HRVOs described above, but may be contributing to macular edema  - last A1c 6.6 on 11.13.19  - monitor  7,8. Hypertensive retinopathy OU  - discussed importance of tight BP control  - monitor  9. Pseudophakia OU  - s/p CE/IOL w/ [place] OU - Dr. Jenene Slicker (  2018)  - doing well  - monitor   Ophthalmic Meds Ordered this visit:  No orders of the defined types were placed in this encounter.      Return if symptoms worsen or fail to improve.  There are no Patient Instructions on file for this visit.   Explained the diagnoses, plan, and follow up with the patient and they expressed understanding.  Patient expressed understanding of the importance of proper follow up care.   This document serves as a record of services personally performed by Gardiner Sleeper, MD, PhD. It was created on their behalf  by San Jetty. Owens Shark, OA an ophthalmic technician. The creation of this record is the provider's dictation and/or activities during the visit.    Electronically signed by: San Jetty. Owens Shark, [place] 09.09.2021 1:09 PM   This document serves as a record of services personally performed by Gardiner Sleeper, MD, PhD. It was created on their behalf by Estill Bakes, COT an ophthalmic technician. The creation of this record is the provider's dictation and/or activities during the visit.    Electronically signed by: Estill Bakes, COT 9.10.21 @ 1:09 PM  Gardiner Sleeper, M.D., Ph.D. Diseases & Surgery of the Retina and Vitreous [facility] 9.10.21  I have reviewed the above documentation for accuracy and completeness, and I agree with the above. Gardiner Sleeper, M.D., Ph.D. 08/18/20 1:09 PM   Abbreviations: M myopia (nearsighted); A astigmatism; H hyperopia (farsighted); P presbyopia; Mrx spectacle prescription;  CTL contact lenses; OD right eye; OS left eye; OU both eyes  XT exotropia; ET esotropia; PEK punctate epithelial keratitis; PEE punctate epithelial erosions; DES dry eye syndrome; MGD meibomian gland dysfunction; ATs artificial tears; PFAT's preservative free artificial tears; [place] nuclear sclerotic cataract; PSC posterior subcapsular cataract; ERM epi-retinal membrane; PVD posterior vitreous detachment; RD retinal detachment; DM diabetes mellitus; DR diabetic retinopathy; NPDR non-proliferative diabetic retinopathy; PDR proliferative diabetic retinopathy; CSME clinically significant macular edema; DME diabetic macular edema; dbh dot blot hemorrhages; CWS cotton wool spot; POAG primary open angle glaucoma; C/D cup-to-disc ratio; HVF humphrey visual field; GVF goldmann visual field; OCT optical coherence tomography; IOP intraocular pressure; BRVO Branch retinal vein occlusion; CRVO central retinal vein occlusion; CRAO central retinal artery occlusion; BRAO branch retinal artery  occlusion; RT retinal tear; SB scleral buckle; PPV pars plana vitrectomy; VH Vitreous hemorrhage; PRP panretinal laser photocoagulation; IVK intravitreal kenalog; VMT vitreomacular traction; MH Macular hole;  NVD neovascularization of the disc; NVE neovascularization elsewhere; AREDS age related eye disease study; ARMD age related macular degeneration; POAG primary open angle glaucoma; EBMD epithelial/anterior basement membrane dystrophy; ACIOL anterior chamber intraocular lens; IOL intraocular lens; PCIOL posterior chamber intraocular lens; Phaco/IOL phacoemulsification with intraocular lens placement; [place] photorefractive keratectomy; LASIK laser assisted in situ keratomileusis; HTN hypertension; DM diabetes mellitus; COPD chronic obstructive pulmonary disease

## 2020-08-18 ENCOUNTER — Ambulatory Visit (INDEPENDENT_AMBULATORY_CARE_PROVIDER_SITE_OTHER): Payer: Medicare PPO | Admitting: Ophthalmology

## 2020-08-18 ENCOUNTER — Other Ambulatory Visit: Payer: Self-pay

## 2020-08-18 ENCOUNTER — Encounter (INDEPENDENT_AMBULATORY_CARE_PROVIDER_SITE_OTHER): Payer: Self-pay | Admitting: Ophthalmology

## 2020-08-18 DIAGNOSIS — Z961 Presence of intraocular lens: Secondary | ICD-10-CM

## 2020-08-18 DIAGNOSIS — H3093 Unspecified chorioretinal inflammation, bilateral: Secondary | ICD-10-CM

## 2020-08-18 DIAGNOSIS — H401133 Primary open-angle glaucoma, bilateral, severe stage: Secondary | ICD-10-CM | POA: Diagnosis not present

## 2020-08-18 DIAGNOSIS — H34833 Tributary (branch) retinal vein occlusion, bilateral, with macular edema: Secondary | ICD-10-CM | POA: Diagnosis not present

## 2020-08-18 DIAGNOSIS — H3581 Retinal edema: Secondary | ICD-10-CM | POA: Diagnosis not present

## 2020-08-18 DIAGNOSIS — I10 Essential (primary) hypertension: Secondary | ICD-10-CM

## 2020-08-18 DIAGNOSIS — H35033 Hypertensive retinopathy, bilateral: Secondary | ICD-10-CM | POA: Diagnosis not present

## 2020-08-18 DIAGNOSIS — E113313 Type 2 diabetes mellitus with moderate nonproliferative diabetic retinopathy with macular edema, bilateral: Secondary | ICD-10-CM

## 2020-08-18 DIAGNOSIS — H1013 Acute atopic conjunctivitis, bilateral: Secondary | ICD-10-CM | POA: Diagnosis not present

## 2020-10-10 DIAGNOSIS — H3581 Retinal edema: Secondary | ICD-10-CM | POA: Diagnosis not present

## 2020-10-10 DIAGNOSIS — Z79899 Other long term (current) drug therapy: Secondary | ICD-10-CM | POA: Diagnosis not present

## 2020-10-10 DIAGNOSIS — Z961 Presence of intraocular lens: Secondary | ICD-10-CM | POA: Diagnosis not present

## 2020-10-10 DIAGNOSIS — H30031 Focal chorioretinal inflammation, peripheral, right eye: Secondary | ICD-10-CM | POA: Diagnosis not present

## 2020-10-10 DIAGNOSIS — E113311 Type 2 diabetes mellitus with moderate nonproliferative diabetic retinopathy with macular edema, right eye: Secondary | ICD-10-CM | POA: Diagnosis not present

## 2020-10-10 DIAGNOSIS — H401132 Primary open-angle glaucoma, bilateral, moderate stage: Secondary | ICD-10-CM | POA: Diagnosis not present

## 2020-11-25 DIAGNOSIS — H401131 Primary open-angle glaucoma, bilateral, mild stage: Secondary | ICD-10-CM | POA: Diagnosis not present

## 2021-08-31 LAB — HM DIABETES EYE EXAM

## 2021-12-14 DIAGNOSIS — Z961 Presence of intraocular lens: Secondary | ICD-10-CM | POA: Diagnosis not present

## 2021-12-14 DIAGNOSIS — H401134 Primary open-angle glaucoma, bilateral, indeterminate stage: Secondary | ICD-10-CM | POA: Diagnosis not present

## 2021-12-20 ENCOUNTER — Inpatient Hospital Stay (HOSPITAL_COMMUNITY): Payer: Medicare PPO

## 2021-12-20 ENCOUNTER — Encounter (HOSPITAL_COMMUNITY): Payer: Self-pay | Admitting: Emergency Medicine

## 2021-12-20 ENCOUNTER — Inpatient Hospital Stay (HOSPITAL_COMMUNITY)
Admission: EM | Admit: 2021-12-20 | Discharge: 2021-12-23 | DRG: 871 | Disposition: A | Discharge status: Other Acute Inpatient Hospital | Payer: Medicare PPO | Attending: Family Medicine | Admitting: Family Medicine

## 2021-12-20 ENCOUNTER — Other Ambulatory Visit: Payer: Self-pay

## 2021-12-20 ENCOUNTER — Emergency Department (HOSPITAL_COMMUNITY): Payer: Medicare PPO

## 2021-12-20 DIAGNOSIS — D329 Benign neoplasm of meninges, unspecified: Secondary | ICD-10-CM | POA: Diagnosis present

## 2021-12-20 DIAGNOSIS — R6520 Severe sepsis without septic shock: Secondary | ICD-10-CM | POA: Diagnosis not present

## 2021-12-20 DIAGNOSIS — J0110 Acute frontal sinusitis, unspecified: Secondary | ICD-10-CM | POA: Diagnosis not present

## 2021-12-20 DIAGNOSIS — E785 Hyperlipidemia, unspecified: Secondary | ICD-10-CM | POA: Diagnosis not present

## 2021-12-20 DIAGNOSIS — Z91138 Patient's unintentional underdosing of medication regimen for other reason: Secondary | ICD-10-CM

## 2021-12-20 DIAGNOSIS — R54 Age-related physical debility: Secondary | ICD-10-CM | POA: Diagnosis not present

## 2021-12-20 DIAGNOSIS — H401130 Primary open-angle glaucoma, bilateral, stage unspecified: Secondary | ICD-10-CM | POA: Diagnosis present

## 2021-12-20 DIAGNOSIS — H02402 Unspecified ptosis of left eyelid: Secondary | ICD-10-CM | POA: Diagnosis present

## 2021-12-20 DIAGNOSIS — E876 Hypokalemia: Secondary | ICD-10-CM | POA: Diagnosis present

## 2021-12-20 DIAGNOSIS — M79672 Pain in left foot: Secondary | ICD-10-CM

## 2021-12-20 DIAGNOSIS — Z20822 Contact with and (suspected) exposure to covid-19: Secondary | ICD-10-CM | POA: Diagnosis present

## 2021-12-20 DIAGNOSIS — R531 Weakness: Secondary | ICD-10-CM | POA: Diagnosis not present

## 2021-12-20 DIAGNOSIS — A419 Sepsis, unspecified organism: Secondary | ICD-10-CM | POA: Diagnosis not present

## 2021-12-20 DIAGNOSIS — Z7982 Long term (current) use of aspirin: Secondary | ICD-10-CM | POA: Diagnosis not present

## 2021-12-20 DIAGNOSIS — R296 Repeated falls: Secondary | ICD-10-CM | POA: Diagnosis present

## 2021-12-20 DIAGNOSIS — M25512 Pain in left shoulder: Secondary | ICD-10-CM

## 2021-12-20 DIAGNOSIS — Z7984 Long term (current) use of oral hypoglycemic drugs: Secondary | ICD-10-CM

## 2021-12-20 DIAGNOSIS — R0689 Other abnormalities of breathing: Secondary | ICD-10-CM | POA: Diagnosis not present

## 2021-12-20 DIAGNOSIS — R29818 Other symptoms and signs involving the nervous system: Secondary | ICD-10-CM | POA: Diagnosis not present

## 2021-12-20 DIAGNOSIS — R509 Fever, unspecified: Secondary | ICD-10-CM | POA: Diagnosis not present

## 2021-12-20 DIAGNOSIS — E113299 Type 2 diabetes mellitus with mild nonproliferative diabetic retinopathy without macular edema, unspecified eye: Secondary | ICD-10-CM | POA: Diagnosis not present

## 2021-12-20 DIAGNOSIS — M109 Gout, unspecified: Secondary | ICD-10-CM | POA: Diagnosis present

## 2021-12-20 DIAGNOSIS — Z7901 Long term (current) use of anticoagulants: Secondary | ICD-10-CM | POA: Diagnosis not present

## 2021-12-20 DIAGNOSIS — Z8673 Personal history of transient ischemic attack (TIA), and cerebral infarction without residual deficits: Secondary | ICD-10-CM

## 2021-12-20 DIAGNOSIS — E860 Dehydration: Secondary | ICD-10-CM | POA: Diagnosis not present

## 2021-12-20 DIAGNOSIS — G062 Extradural and subdural abscess, unspecified: Secondary | ICD-10-CM | POA: Diagnosis not present

## 2021-12-20 DIAGNOSIS — E113293 Type 2 diabetes mellitus with mild nonproliferative diabetic retinopathy without macular edema, bilateral: Secondary | ICD-10-CM | POA: Diagnosis present

## 2021-12-20 DIAGNOSIS — M47812 Spondylosis without myelopathy or radiculopathy, cervical region: Secondary | ICD-10-CM | POA: Diagnosis not present

## 2021-12-20 DIAGNOSIS — Z9181 History of falling: Secondary | ICD-10-CM

## 2021-12-20 DIAGNOSIS — M6282 Rhabdomyolysis: Secondary | ICD-10-CM | POA: Diagnosis not present

## 2021-12-20 DIAGNOSIS — Z8249 Family history of ischemic heart disease and other diseases of the circulatory system: Secondary | ICD-10-CM

## 2021-12-20 DIAGNOSIS — Z87891 Personal history of nicotine dependence: Secondary | ICD-10-CM

## 2021-12-20 DIAGNOSIS — I10 Essential (primary) hypertension: Secondary | ICD-10-CM | POA: Diagnosis not present

## 2021-12-20 DIAGNOSIS — I480 Paroxysmal atrial fibrillation: Secondary | ICD-10-CM | POA: Diagnosis present

## 2021-12-20 DIAGNOSIS — M549 Dorsalgia, unspecified: Secondary | ICD-10-CM | POA: Diagnosis present

## 2021-12-20 DIAGNOSIS — K219 Gastro-esophageal reflux disease without esophagitis: Secondary | ICD-10-CM | POA: Diagnosis not present

## 2021-12-20 DIAGNOSIS — I959 Hypotension, unspecified: Secondary | ICD-10-CM | POA: Diagnosis not present

## 2021-12-20 DIAGNOSIS — G9341 Metabolic encephalopathy: Secondary | ICD-10-CM | POA: Diagnosis not present

## 2021-12-20 DIAGNOSIS — Z79899 Other long term (current) drug therapy: Secondary | ICD-10-CM

## 2021-12-20 DIAGNOSIS — J321 Chronic frontal sinusitis: Secondary | ICD-10-CM | POA: Diagnosis present

## 2021-12-20 DIAGNOSIS — G9340 Encephalopathy, unspecified: Secondary | ICD-10-CM | POA: Diagnosis not present

## 2021-12-20 DIAGNOSIS — T466X6A Underdosing of antihyperlipidemic and antiarteriosclerotic drugs, initial encounter: Secondary | ICD-10-CM | POA: Diagnosis present

## 2021-12-20 DIAGNOSIS — G8194 Hemiplegia, unspecified affecting left nondominant side: Secondary | ICD-10-CM | POA: Diagnosis not present

## 2021-12-20 DIAGNOSIS — Z803 Family history of malignant neoplasm of breast: Secondary | ICD-10-CM

## 2021-12-20 DIAGNOSIS — R000 Tachycardia, unspecified: Secondary | ICD-10-CM | POA: Diagnosis not present

## 2021-12-20 DIAGNOSIS — F1090 Alcohol use, unspecified, uncomplicated: Secondary | ICD-10-CM | POA: Diagnosis present

## 2021-12-20 DIAGNOSIS — M1612 Unilateral primary osteoarthritis, left hip: Secondary | ICD-10-CM | POA: Diagnosis not present

## 2021-12-20 DIAGNOSIS — M1712 Unilateral primary osteoarthritis, left knee: Secondary | ICD-10-CM | POA: Diagnosis not present

## 2021-12-20 DIAGNOSIS — Z043 Encounter for examination and observation following other accident: Secondary | ICD-10-CM | POA: Diagnosis not present

## 2021-12-20 DIAGNOSIS — Z87442 Personal history of urinary calculi: Secondary | ICD-10-CM

## 2021-12-20 DIAGNOSIS — H401133 Primary open-angle glaucoma, bilateral, severe stage: Secondary | ICD-10-CM | POA: Diagnosis not present

## 2021-12-20 LAB — URINALYSIS, ROUTINE W REFLEX MICROSCOPIC
Bilirubin Urine: NEGATIVE
Glucose, UA: NEGATIVE mg/dL
Ketones, ur: NEGATIVE mg/dL
Nitrite: NEGATIVE
Protein, ur: 100 mg/dL — AB
Specific Gravity, Urine: 1.013 (ref 1.005–1.030)
pH: 5 (ref 5.0–8.0)

## 2021-12-20 LAB — CBC WITH DIFFERENTIAL/PLATELET
Abs Immature Granulocytes: 0.05 10*3/uL (ref 0.00–0.07)
Basophils Absolute: 0 10*3/uL (ref 0.0–0.1)
Basophils Relative: 0 %
Eosinophils Absolute: 0 10*3/uL (ref 0.0–0.5)
Eosinophils Relative: 0 %
HCT: 37.9 % (ref 36.0–46.0)
Hemoglobin: 12.8 g/dL (ref 12.0–15.0)
Immature Granulocytes: 0 %
Lymphocytes Relative: 9 %
Lymphs Abs: 1 10*3/uL (ref 0.7–4.0)
MCH: 31 pg (ref 26.0–34.0)
MCHC: 33.8 g/dL (ref 30.0–36.0)
MCV: 91.8 fL (ref 80.0–100.0)
Monocytes Absolute: 0.7 10*3/uL (ref 0.1–1.0)
Monocytes Relative: 6 %
Neutro Abs: 10.1 10*3/uL — ABNORMAL HIGH (ref 1.7–7.7)
Neutrophils Relative %: 85 %
Platelets: 209 10*3/uL (ref 150–400)
RBC: 4.13 MIL/uL (ref 3.87–5.11)
RDW: 13.1 % (ref 11.5–15.5)
WBC: 11.9 10*3/uL — ABNORMAL HIGH (ref 4.0–10.5)
nRBC: 0 % (ref 0.0–0.2)

## 2021-12-20 LAB — COMPREHENSIVE METABOLIC PANEL
ALT: 28 U/L (ref 0–44)
AST: 72 U/L — ABNORMAL HIGH (ref 15–41)
Albumin: 2.7 g/dL — ABNORMAL LOW (ref 3.5–5.0)
Alkaline Phosphatase: 57 U/L (ref 38–126)
Anion gap: 10 (ref 5–15)
BUN: 10 mg/dL (ref 8–23)
CO2: 23 mmol/L (ref 22–32)
Calcium: 8.3 mg/dL — ABNORMAL LOW (ref 8.9–10.3)
Chloride: 103 mmol/L (ref 98–111)
Creatinine, Ser: 0.94 mg/dL (ref 0.44–1.00)
GFR, Estimated: 59 mL/min — ABNORMAL LOW (ref >60)
Glucose, Bld: 222 mg/dL — ABNORMAL HIGH (ref 70–99)
Potassium: 2.6 mmol/L — CL (ref 3.5–5.1)
Sodium: 136 mmol/L (ref 135–145)
Total Bilirubin: 1.2 mg/dL (ref 0.3–1.2)
Total Protein: 6.5 g/dL (ref 6.5–8.1)

## 2021-12-20 LAB — RESP PANEL BY RT-PCR (FLU A&B, COVID) ARPGX2
Influenza A by PCR: NEGATIVE
Influenza B by PCR: NEGATIVE
SARS Coronavirus 2 by RT PCR: NEGATIVE

## 2021-12-20 LAB — MAGNESIUM: Magnesium: 1.9 mg/dL (ref 1.7–2.4)

## 2021-12-20 LAB — PROTIME-INR
INR: 1.1 (ref 0.8–1.2)
Prothrombin Time: 14.1 seconds (ref 11.4–15.2)

## 2021-12-20 LAB — LACTIC ACID, PLASMA
Lactic Acid, Venous: 2 mmol/L (ref 0.5–1.9)
Lactic Acid, Venous: 1.8 mmol/L (ref 0.5–1.9)

## 2021-12-20 LAB — APTT: aPTT: 29 seconds (ref 24–36)

## 2021-12-20 LAB — CK: Total CK: 3327 U/L — ABNORMAL HIGH (ref 38–234)

## 2021-12-20 IMAGING — CR DG ANKLE 2V *L*
2 series · 2 of 2 positions shown · non-contrast
Comparison: None.

CLINICAL DATA: Possible fall

EXAM:
LEFT ANKLE - 2 VIEW

[ankle ap]
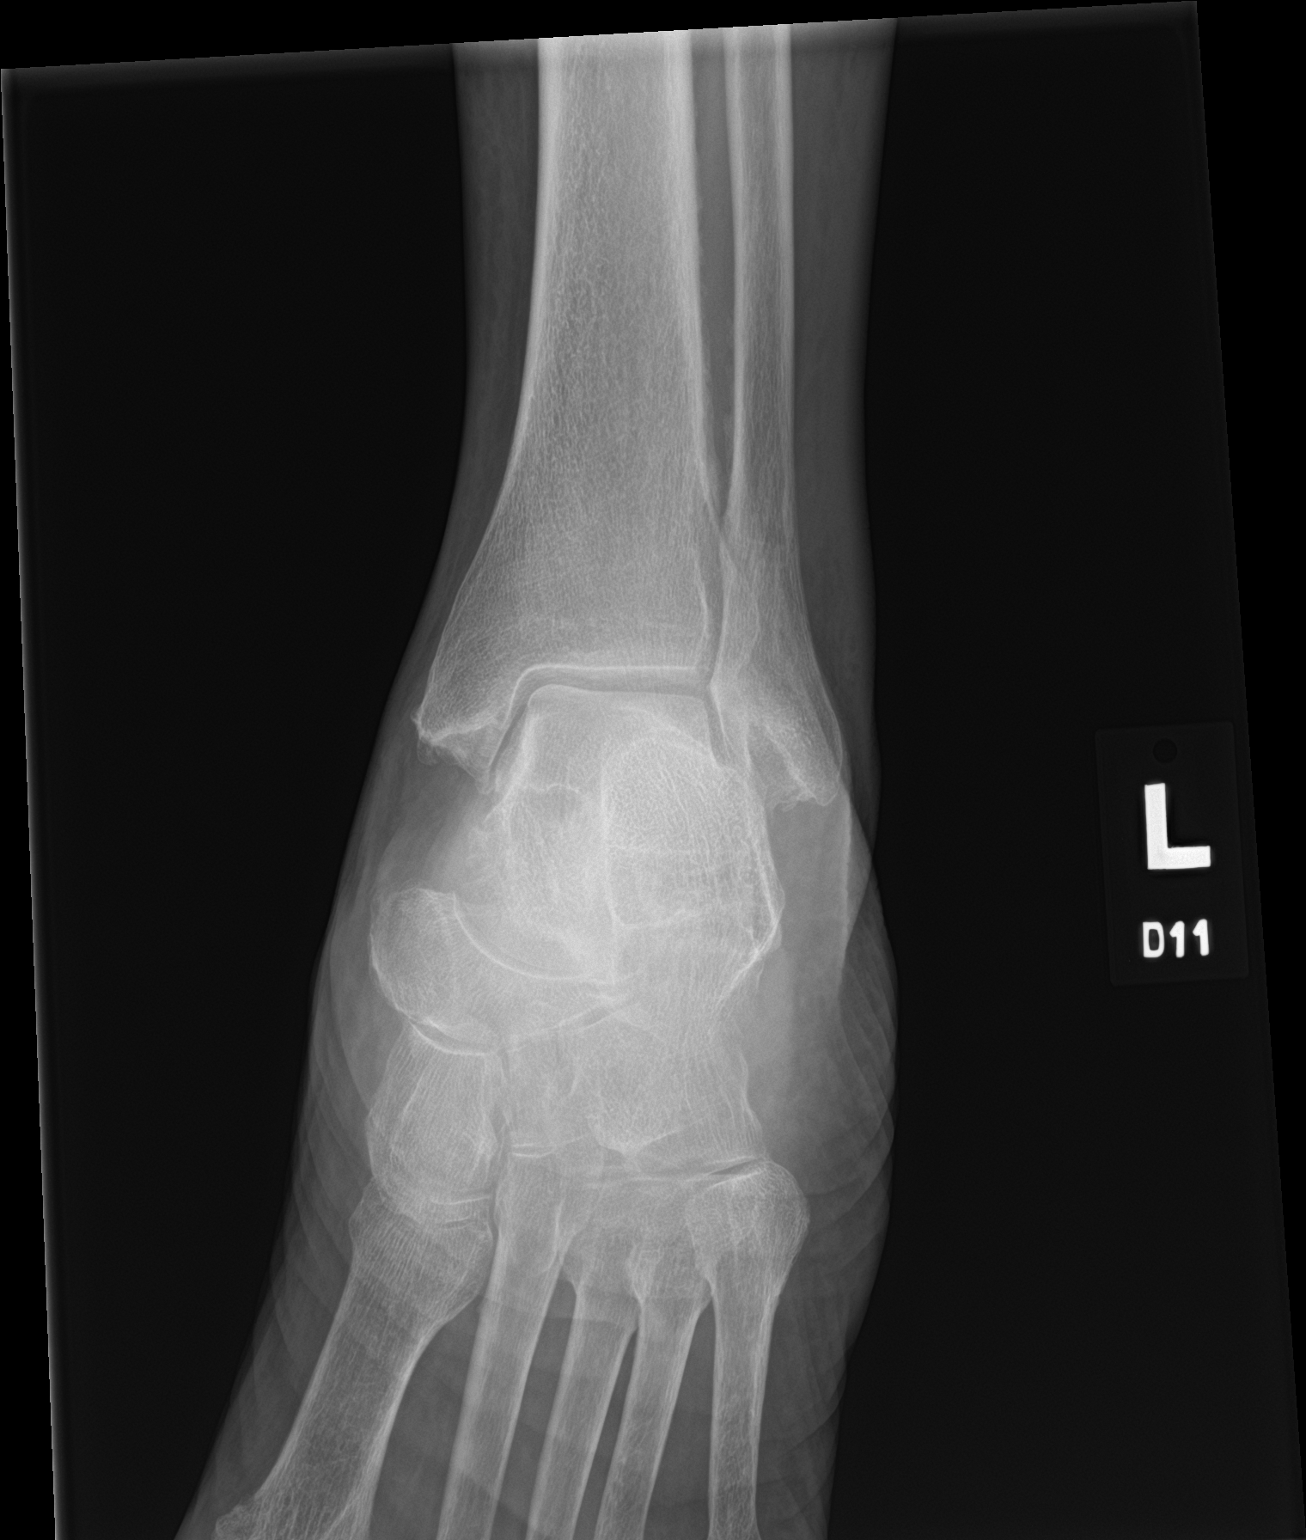

[ankle lat]
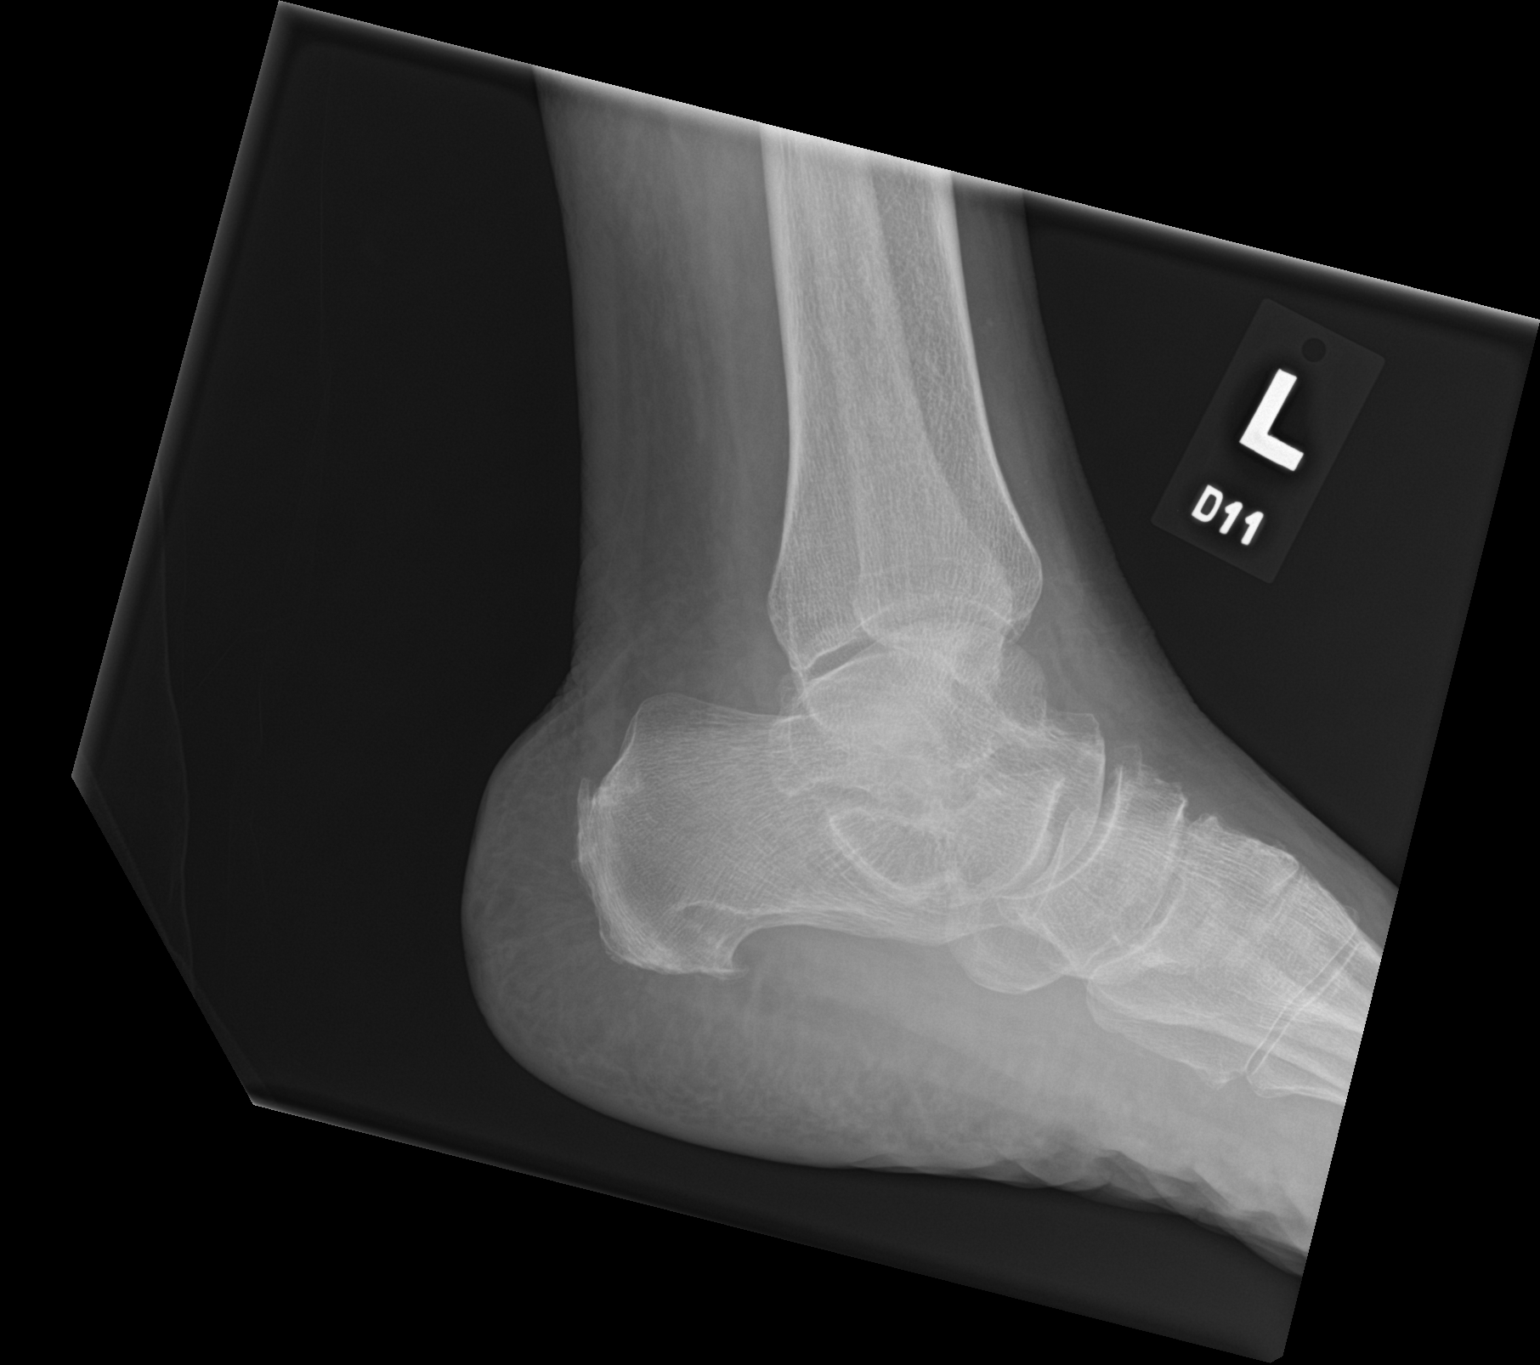

[2 of 2 positions shown; findings below may reference images not displayed]

FINDINGS: Plantar and posterior calcaneal spurs. No acute bony abnormality.
Specifically, no fracture, subluxation, or dislocation.
IMPRESSION: No acute bony abnormality.

## 2021-12-20 IMAGING — CT CT HEAD W/O CM
4 series · 16 of 47 positions shown, 18 images · non-contrast
Comparison: None.

CLINICAL DATA: Acute neuro deficit with weakness. Suspected stroke.



[Series 3: head wo · axial · 0.40mm/px · z∈[-149,-29]mm · 7 of 33 slices shown, 9 images]
[im 5/33  brain]
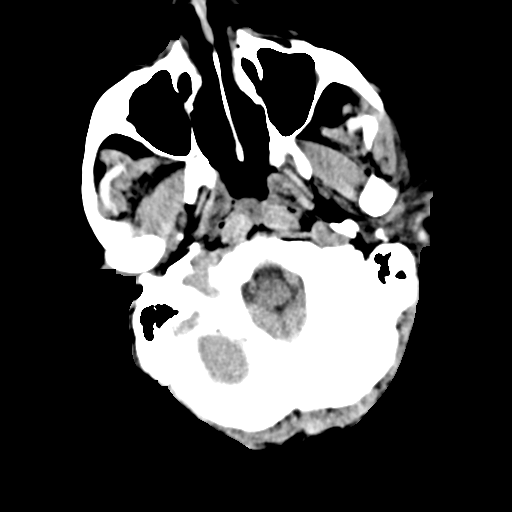
[im 5/33  bone]
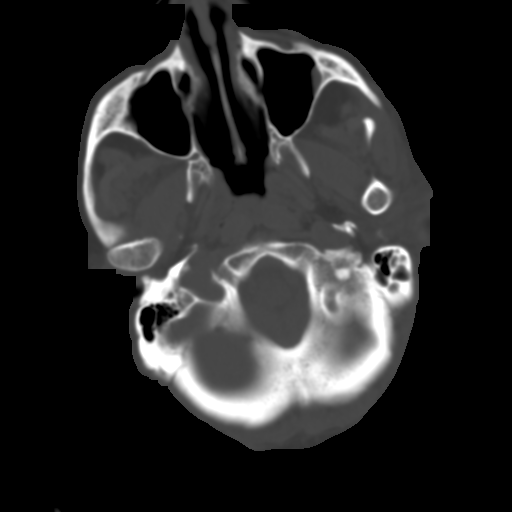
[im 9/33  brain]
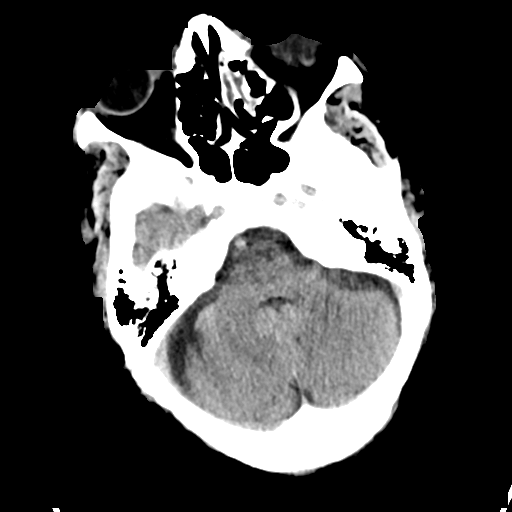
[im 13/33  brain]
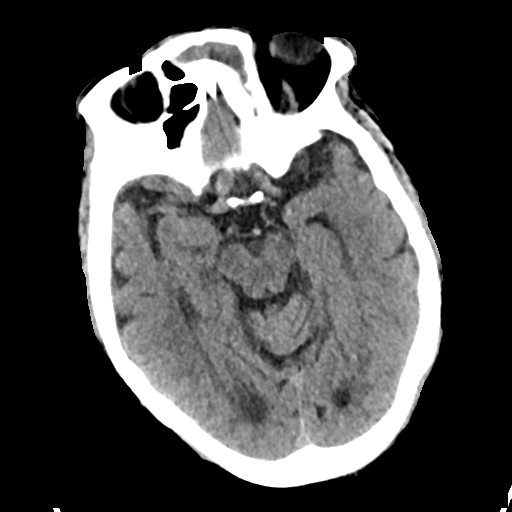
[im 17/33  brain]
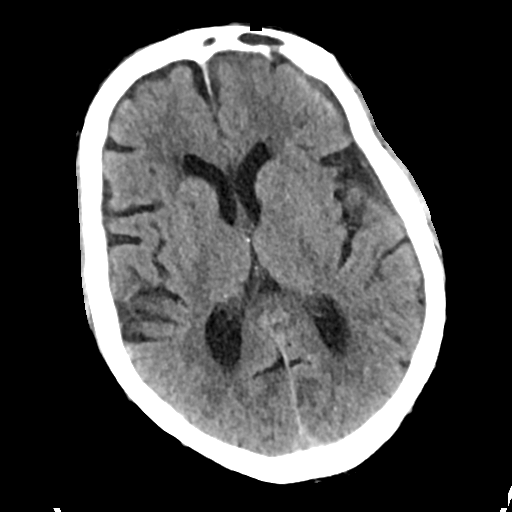
[im 21/33  brain]
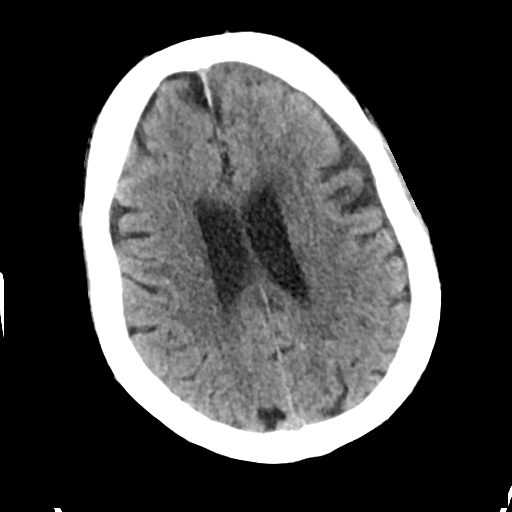
[im 21/33  bone]
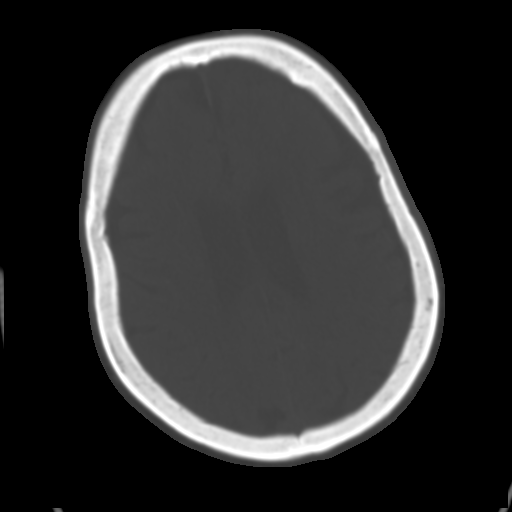
[im 25/33  brain]
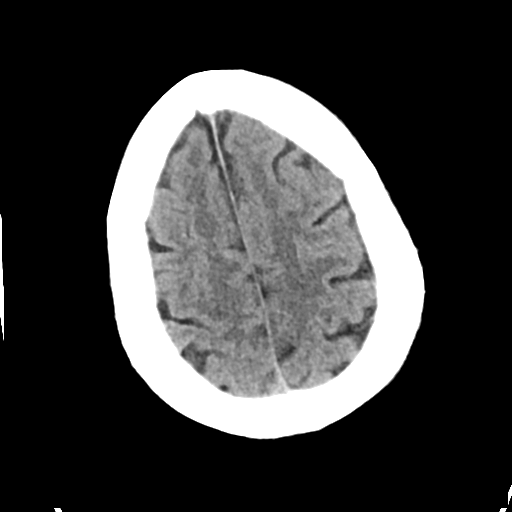
[im 29/33  brain]
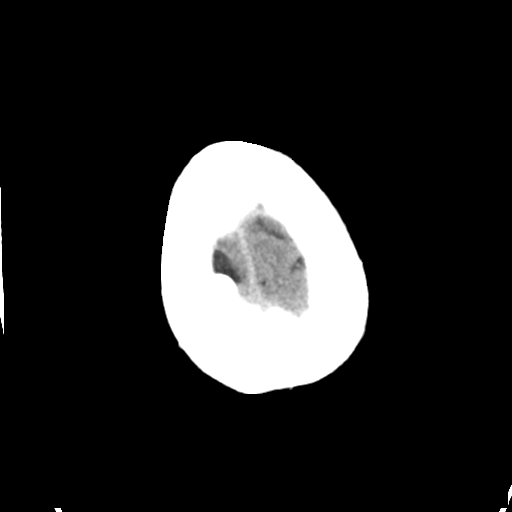

[Series 4: head bone · axial · 0.40mm/px · z∈[-153,-121]mm · 3 of 82 slices shown]
[im 9/82  bone]
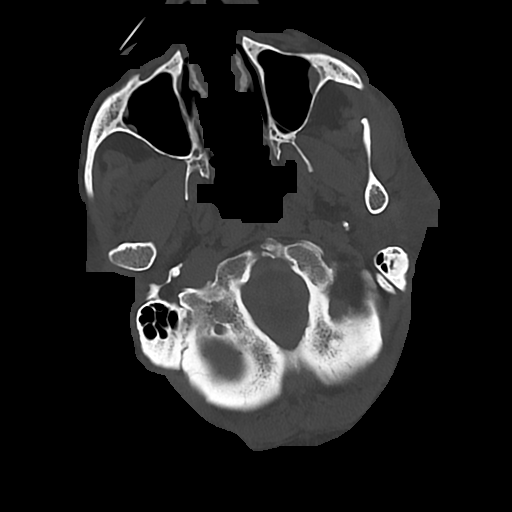
[im 17/82  bone]
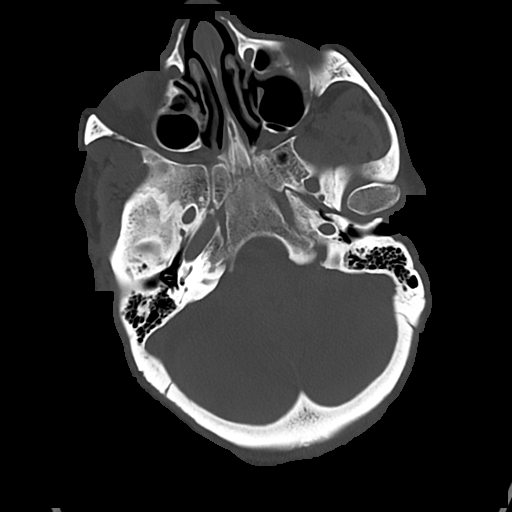
[im 25/82  bone]
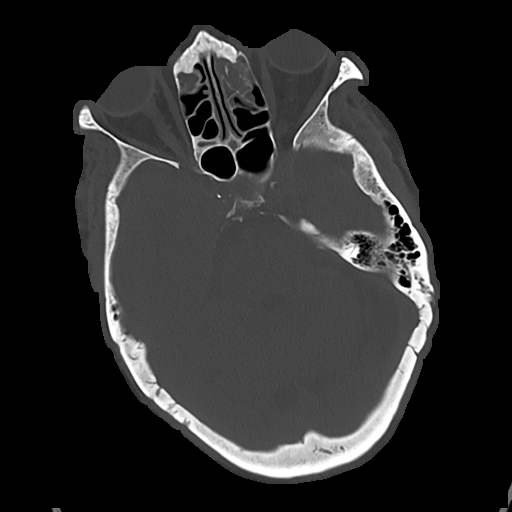

[Series 5: cor soft · coronal · 0.37mm/px · 3 of 71 slices shown]
[im 24/71  brain]
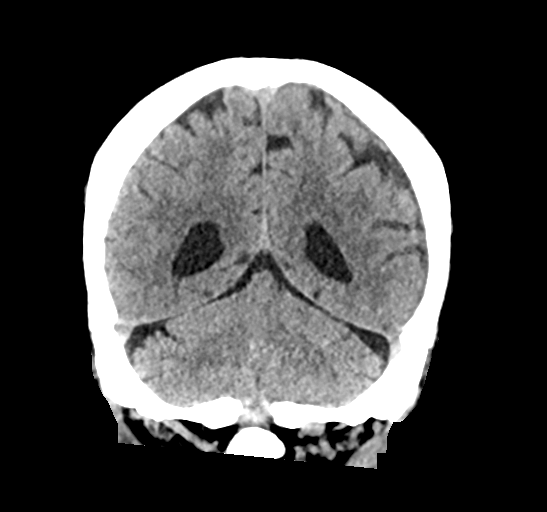
[im 32/71  brain]
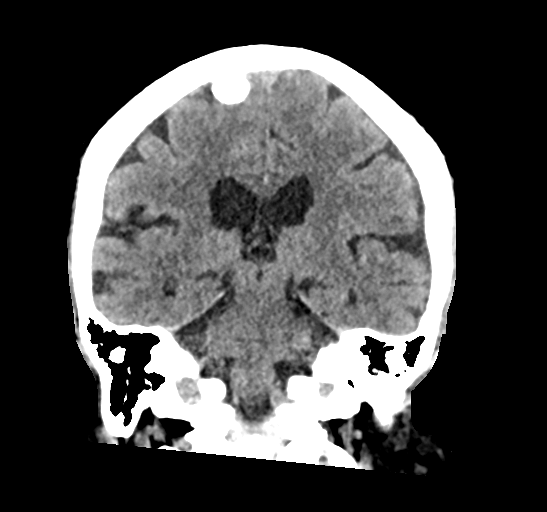
[im 39/71  brain]
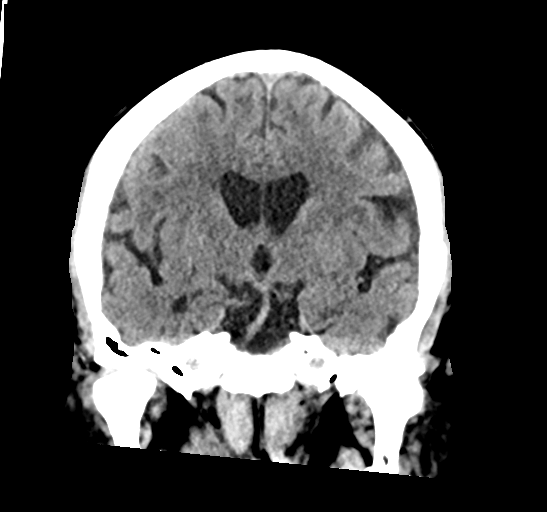

[Series 6: sag soft · sagittal · 0.36mm/px · 3 of 58 slices shown]
[im 20/58  brain]
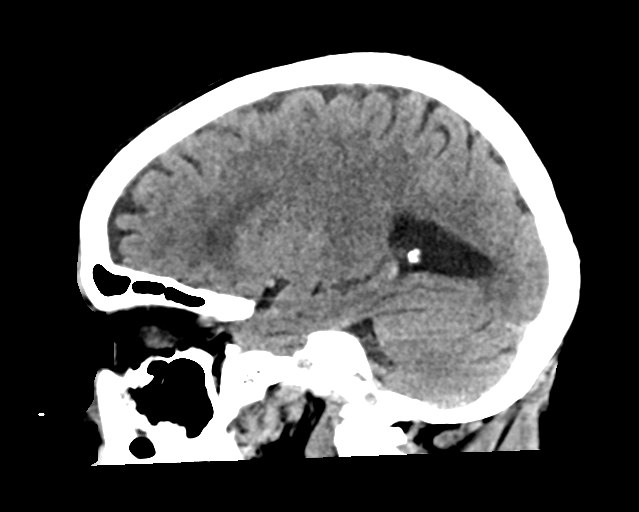
[im 29/58  brain]
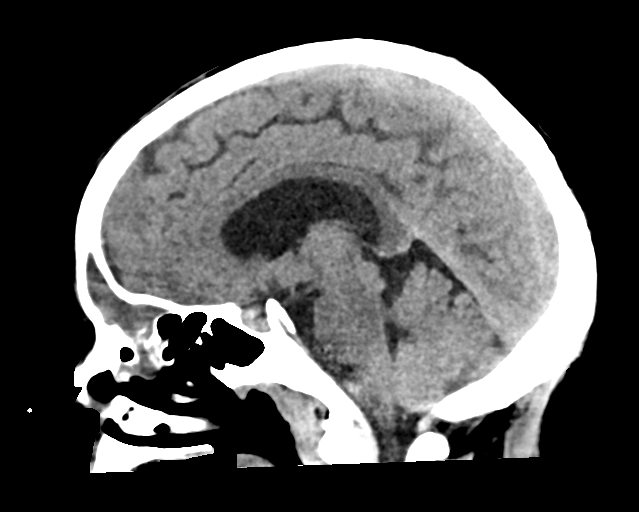
[im 39/58  brain]
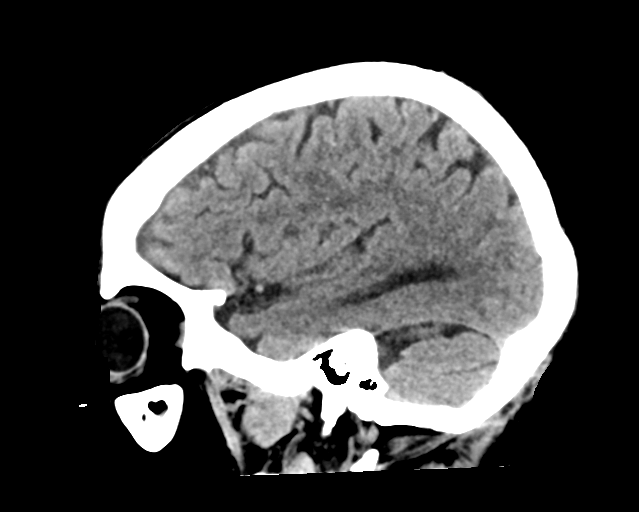

[16 of 47 positions shown; findings below may reference images not displayed]

FINDINGS: Brain: Mildly enlarged ventricles and cortical sulci. Mild patchy
white matter low density in both cerebral hemispheres. Oval, densely
calcified mass at the right vertex measuring 1.6 x 1.2 cm on
sagittal image number [DATE]. No intracranial hemorrhage or CT
findings suspicious for acute infarction.

Vascular: No hyperdense vessel or unexpected calcification.

Skull: Normal. Negative for fracture or focal lesion.

Sinuses/Orbits: Completely opacified left frontal sinus. Extensive
mucosal thickening with air cell opacification in the left ethmoid
sinus. Mucosal thickening in the inferior right frontal sinus and
anterior right ethmoid sinus. Small bilateral maxillary sinus
retention cysts. Status post bilateral cataract extraction.

Other: None.
IMPRESSION: 1. No acute abnormality.
2. 1.6 cm densely calcified meningioma at the right vertex with no
associated edema.
3. Mild diffuse cerebral and cerebellar atrophy.
4. Mild chronic white matter ischemic changes in both cerebral
hemispheres, slightly more pronounced and confluent in the left
frontal lobe.
5. Chronic sinusitis.

## 2021-12-20 IMAGING — CR DG KNEE COMPLETE 4+V*L*
4 series · 4 of 4 positions shown · non-contrast
Comparison: None.

CLINICAL DATA: Fall.

EXAM:
LEFT KNEE - COMPLETE 4+ VIEW

[knee ap]
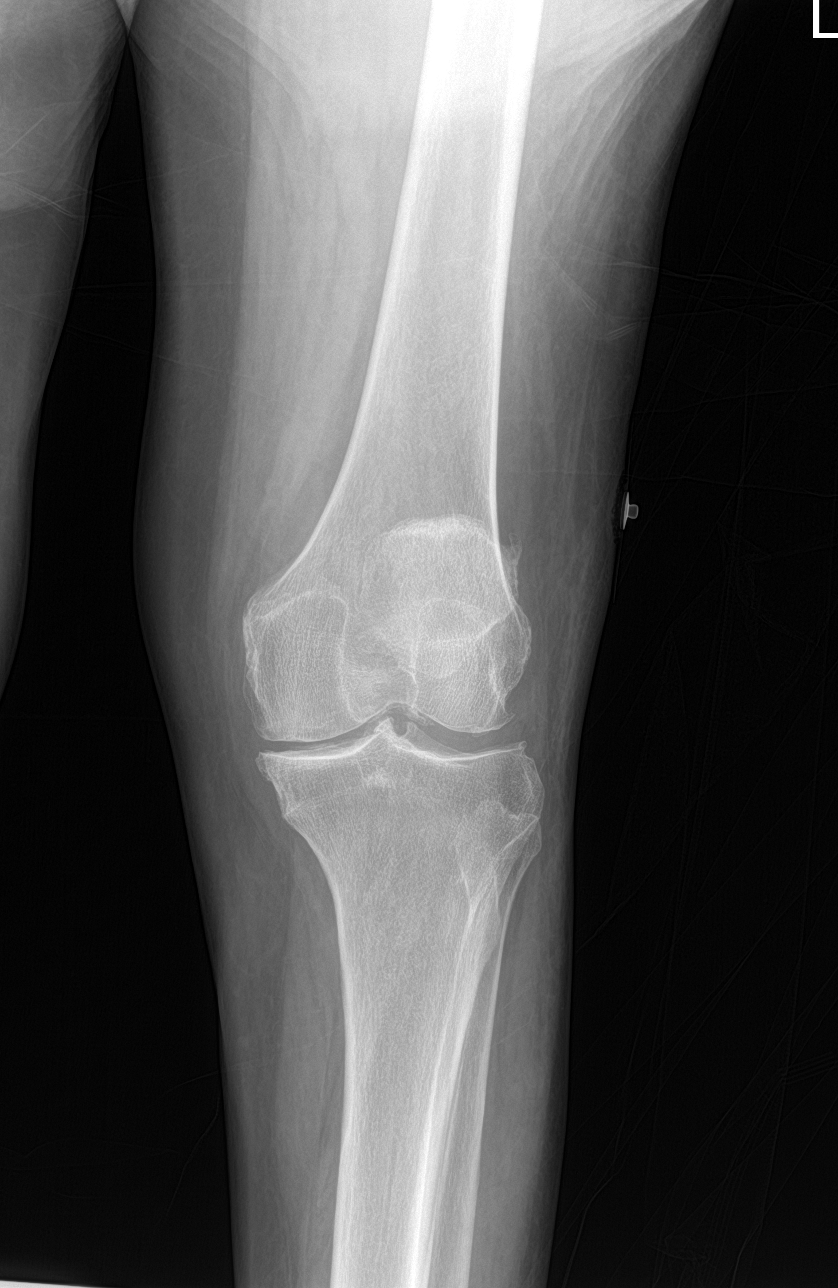

[knee lat]
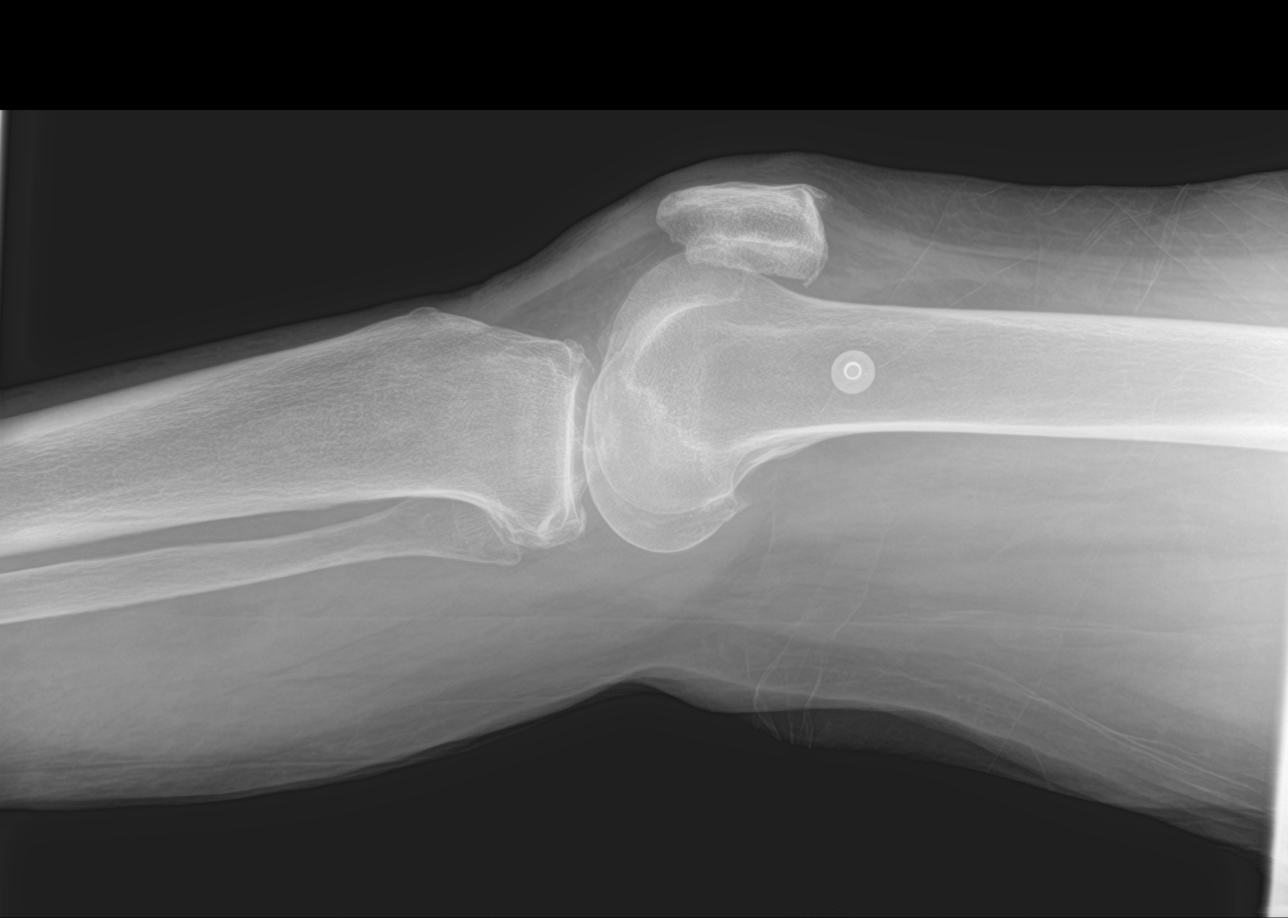

[knee obl (1 of 2)]
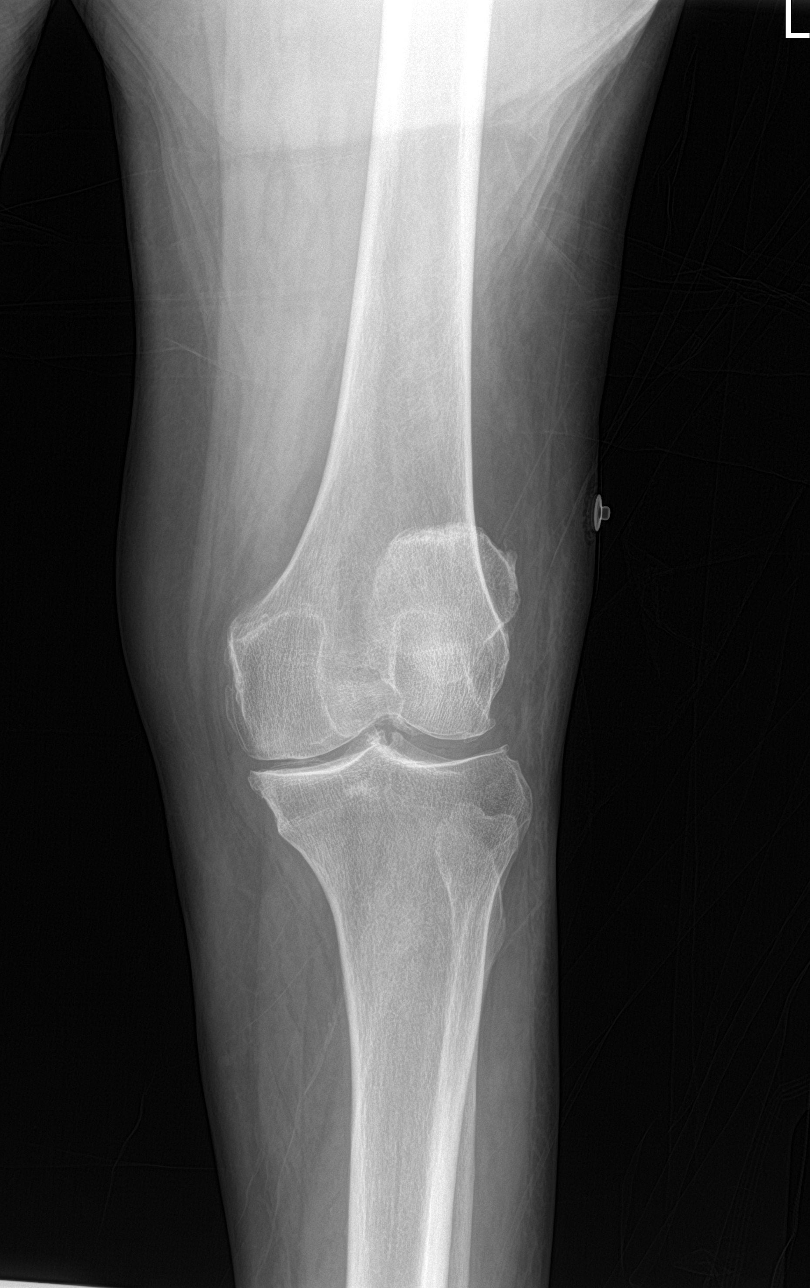

[knee obl (2 of 2)]
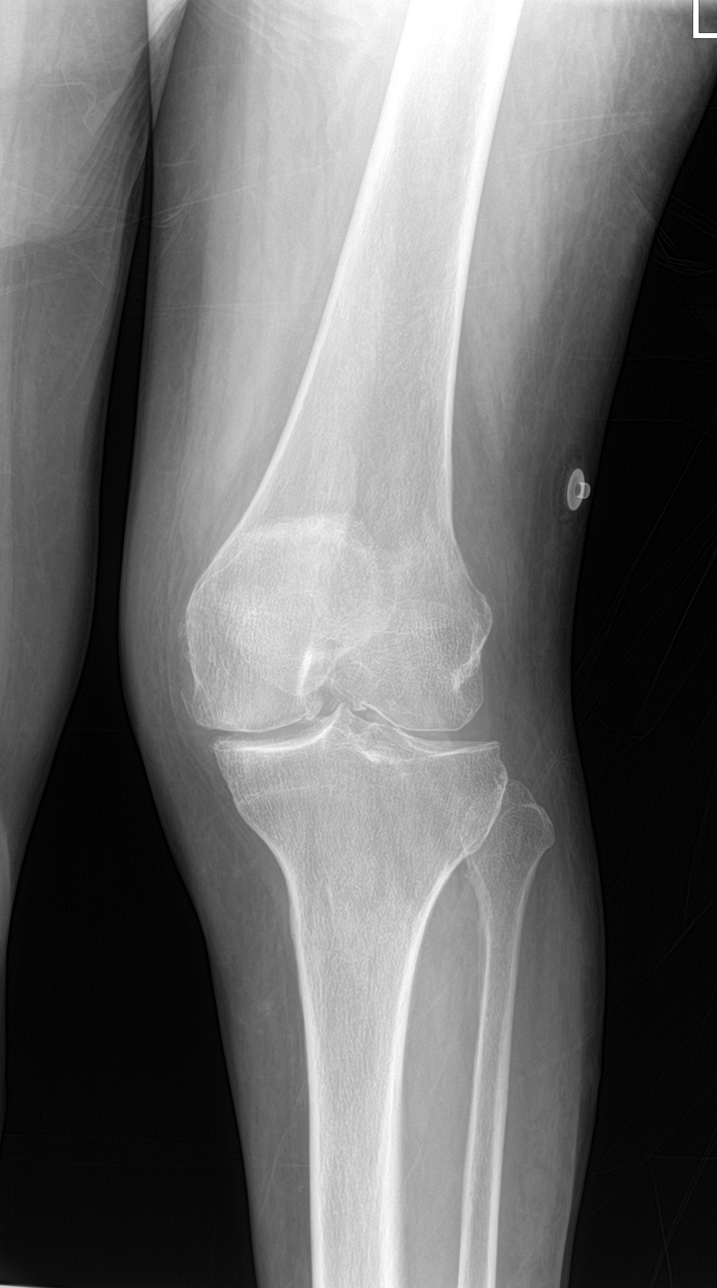

[4 of 4 positions shown; findings below may reference images not displayed]

FINDINGS: No evidence of fracture, dislocation, or joint effusion. Mild
narrowing of medial joint space is noted. Soft tissues are
unremarkable.
IMPRESSION: Mild degenerative joint disease is noted medially. No acute
abnormality seen.

## 2021-12-20 IMAGING — CR DG CHEST 1V
1 series · 1 of 1 positions shown · non-contrast
Comparison: Chest radiograph [DATE]

CLINICAL DATA: Weakness, possible fall

EXAM:
CHEST  1 VIEW

[chest ap]
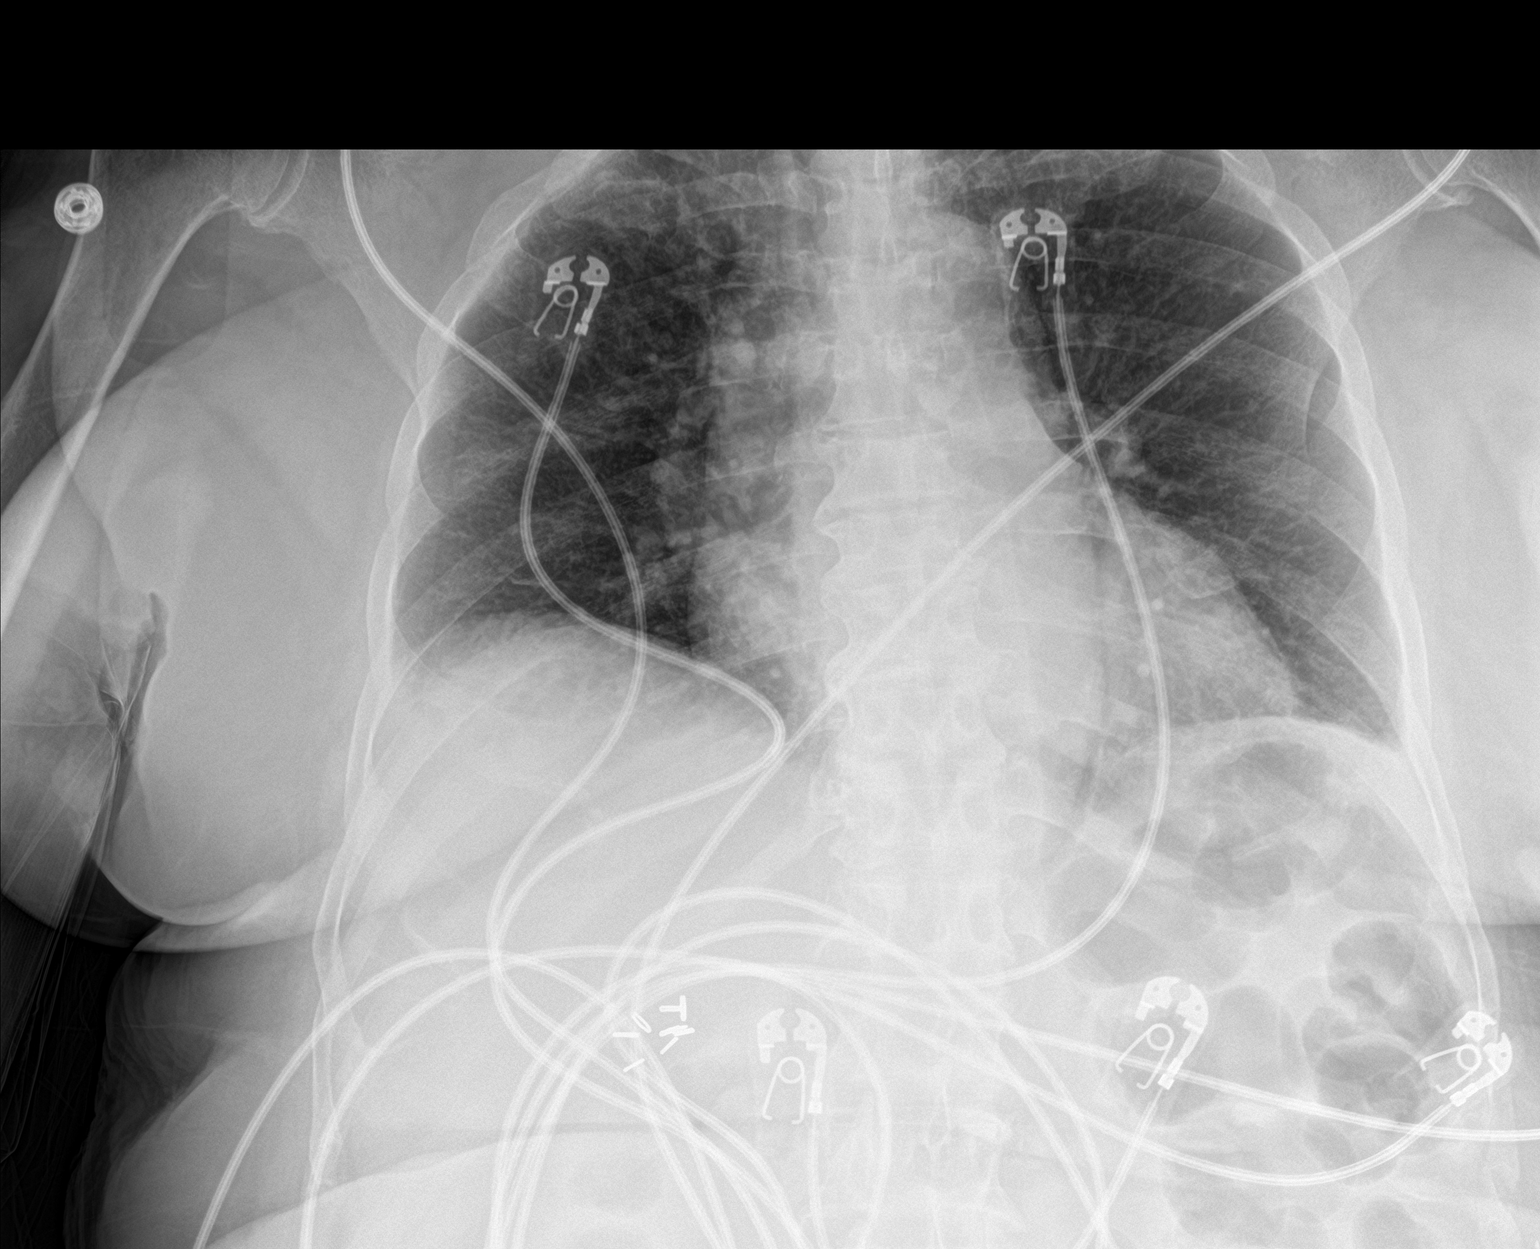

[1 of 1 positions shown; findings below may reference images not displayed]

FINDINGS: The heart is at the upper limits of normal for size. The upper
mediastinal contours are normal.

There is no focal consolidation or pulmonary edema. There is no
pleural effusion or pneumothorax

There is no acute osseous abnormality.
IMPRESSION: No radiographic evidence of acute cardiopulmonary process.

## 2021-12-20 IMAGING — CR DG LUMBAR SPINE 2-3V
3 series · 3 of 3 positions shown · non-contrast
Comparison: None.

CLINICAL DATA: Possible fall

EXAM:
LUMBAR SPINE - 2-3 VIEW

[l-spine ap]
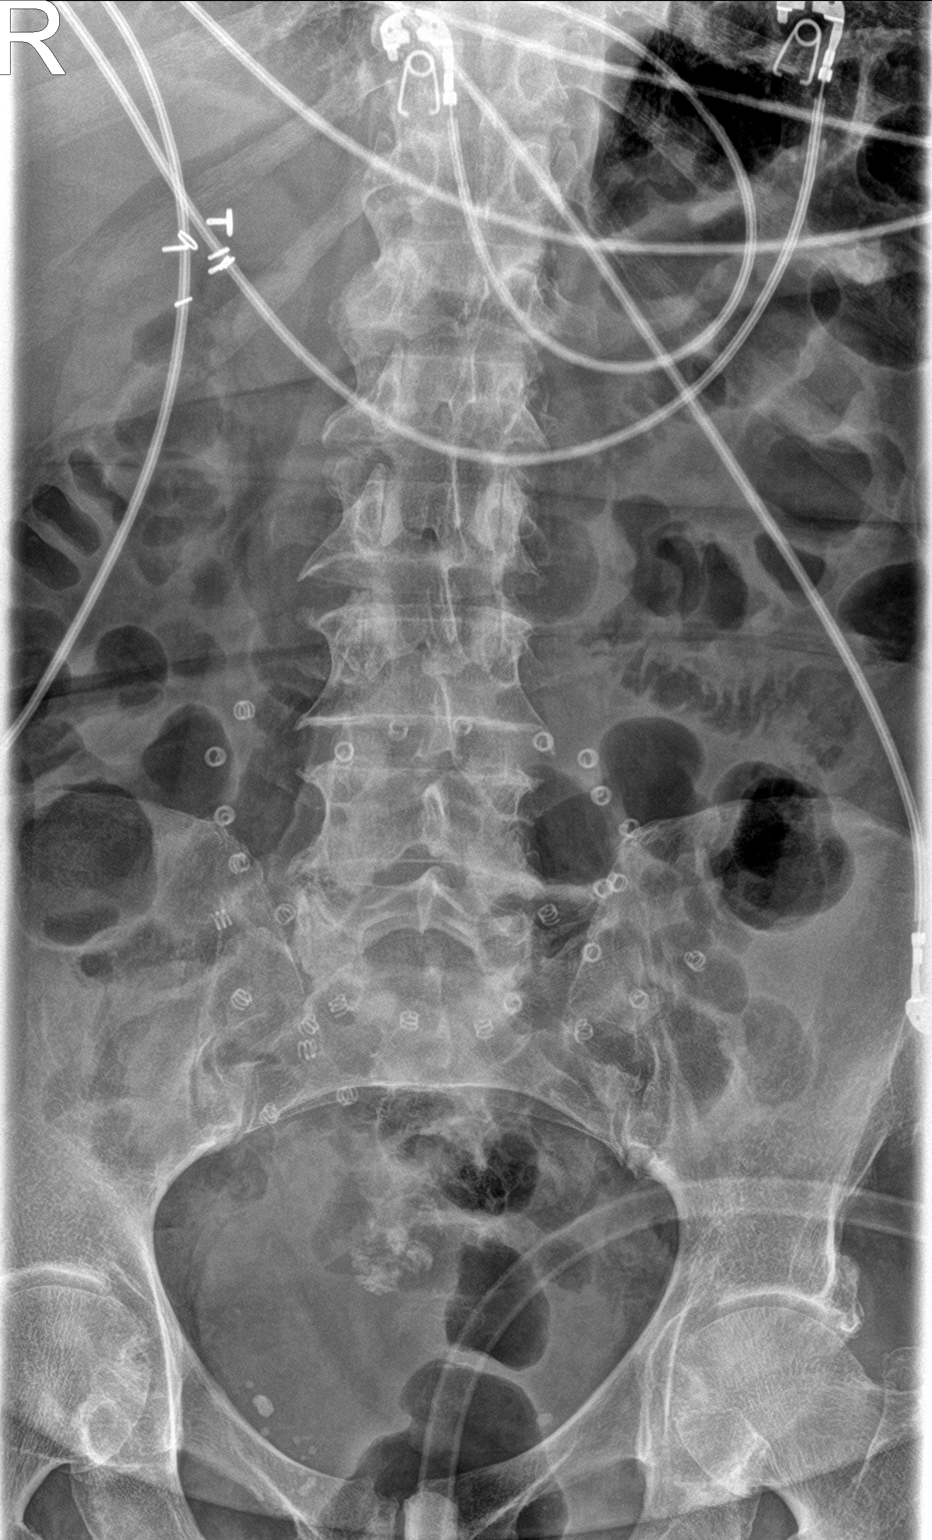

[l-spine lat]
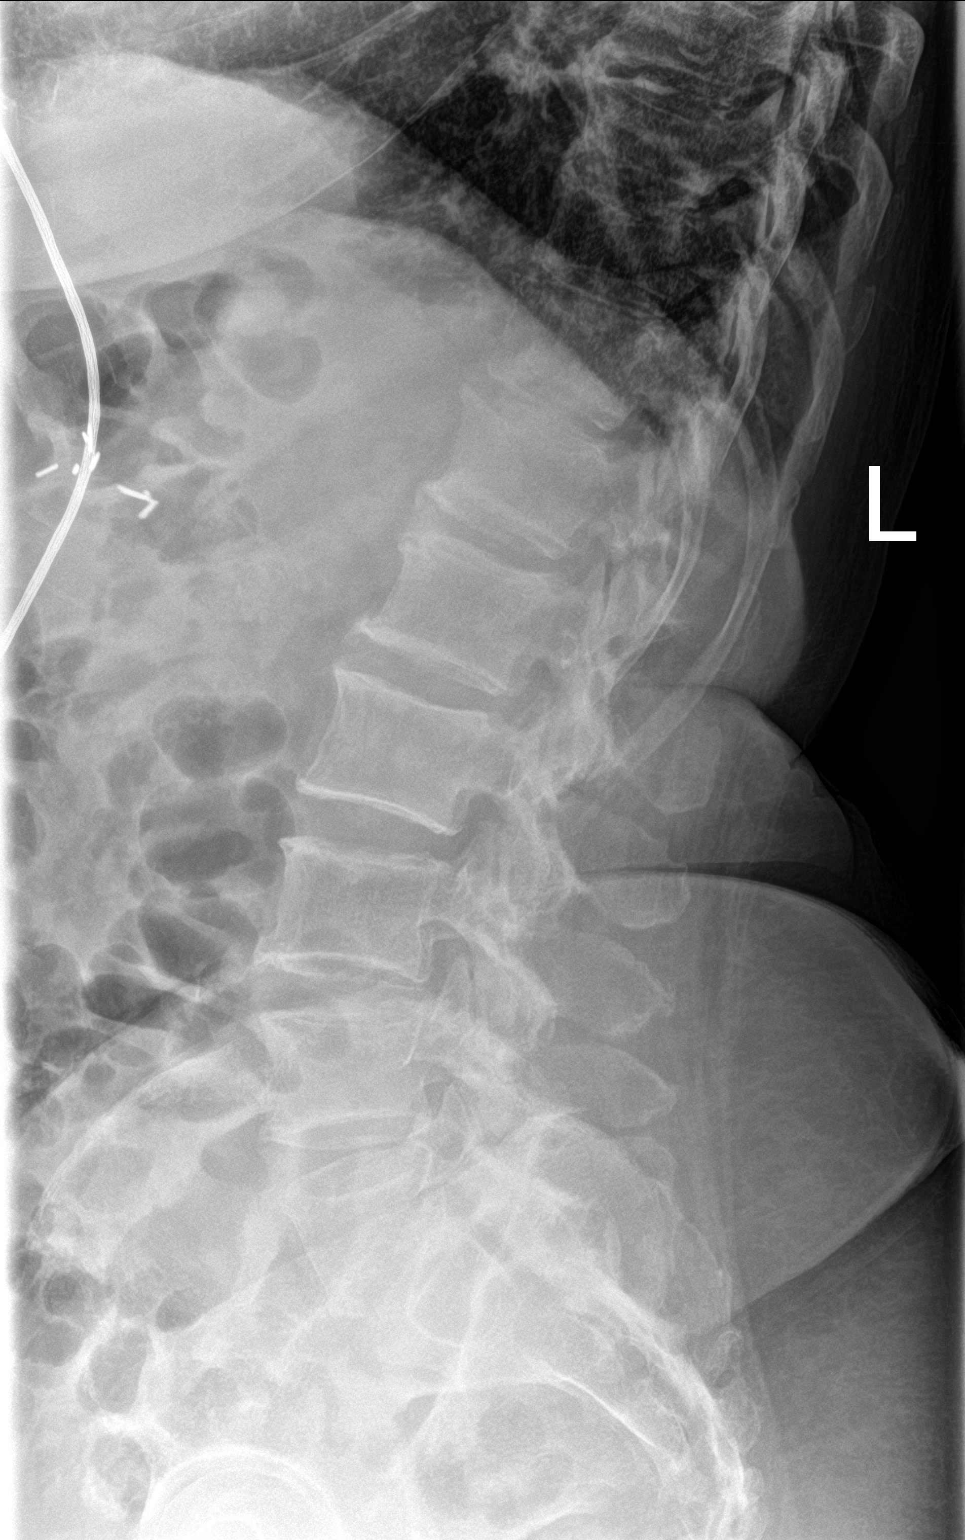

[l-spine spot]
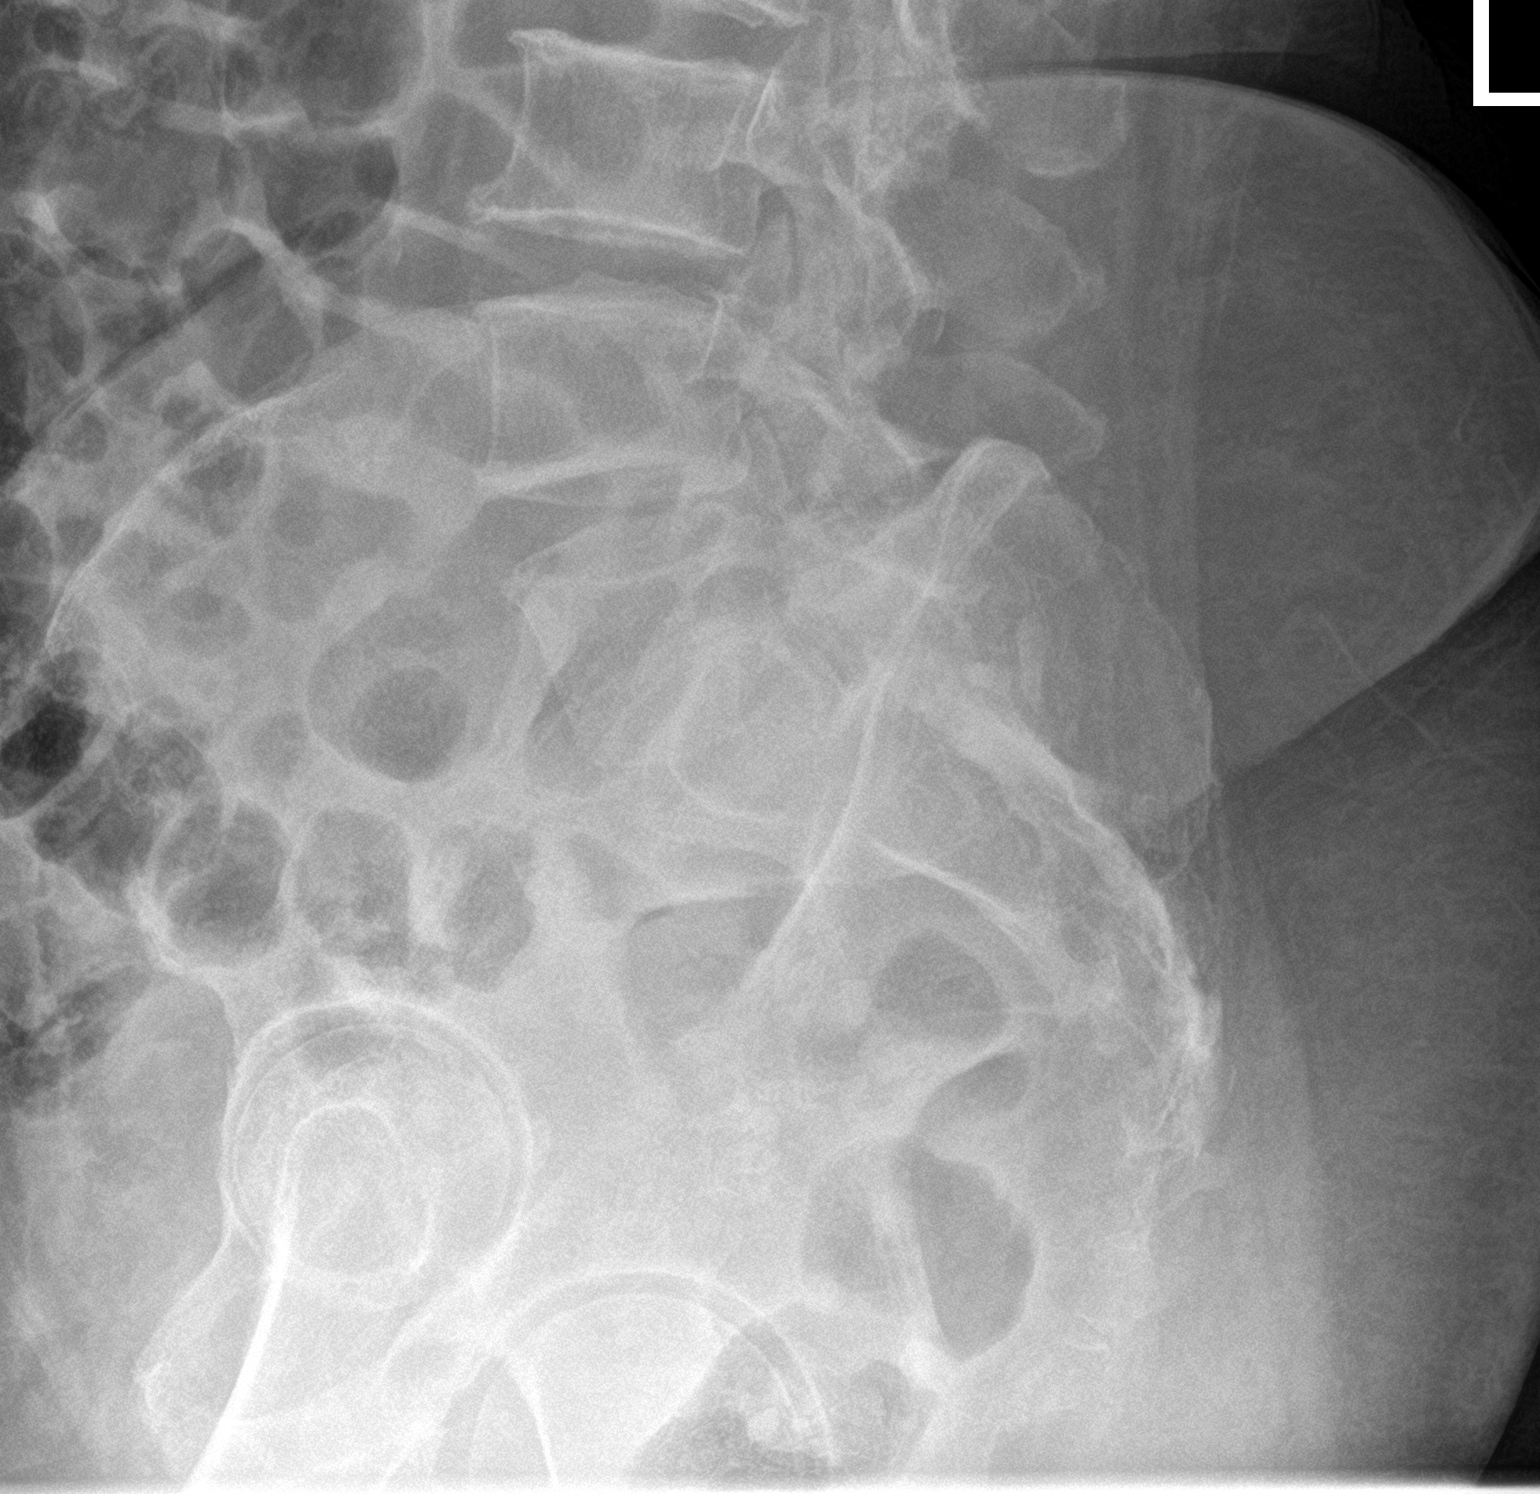

[3 of 3 positions shown; findings below may reference images not displayed]

FINDINGS: Normal alignment. No fracture. Degenerative disc and facet disease.
SI joints symmetric and unremarkable.
IMPRESSION: No acute bony abnormality.

## 2021-12-20 IMAGING — CR DG SHOULDER 2+V*L*
3 series · 3 of 3 positions shown · non-contrast
Comparison: None.

CLINICAL DATA: Possible fall.

EXAM:
LEFT SHOULDER - 2+ VIEW

[shoulder grashey]
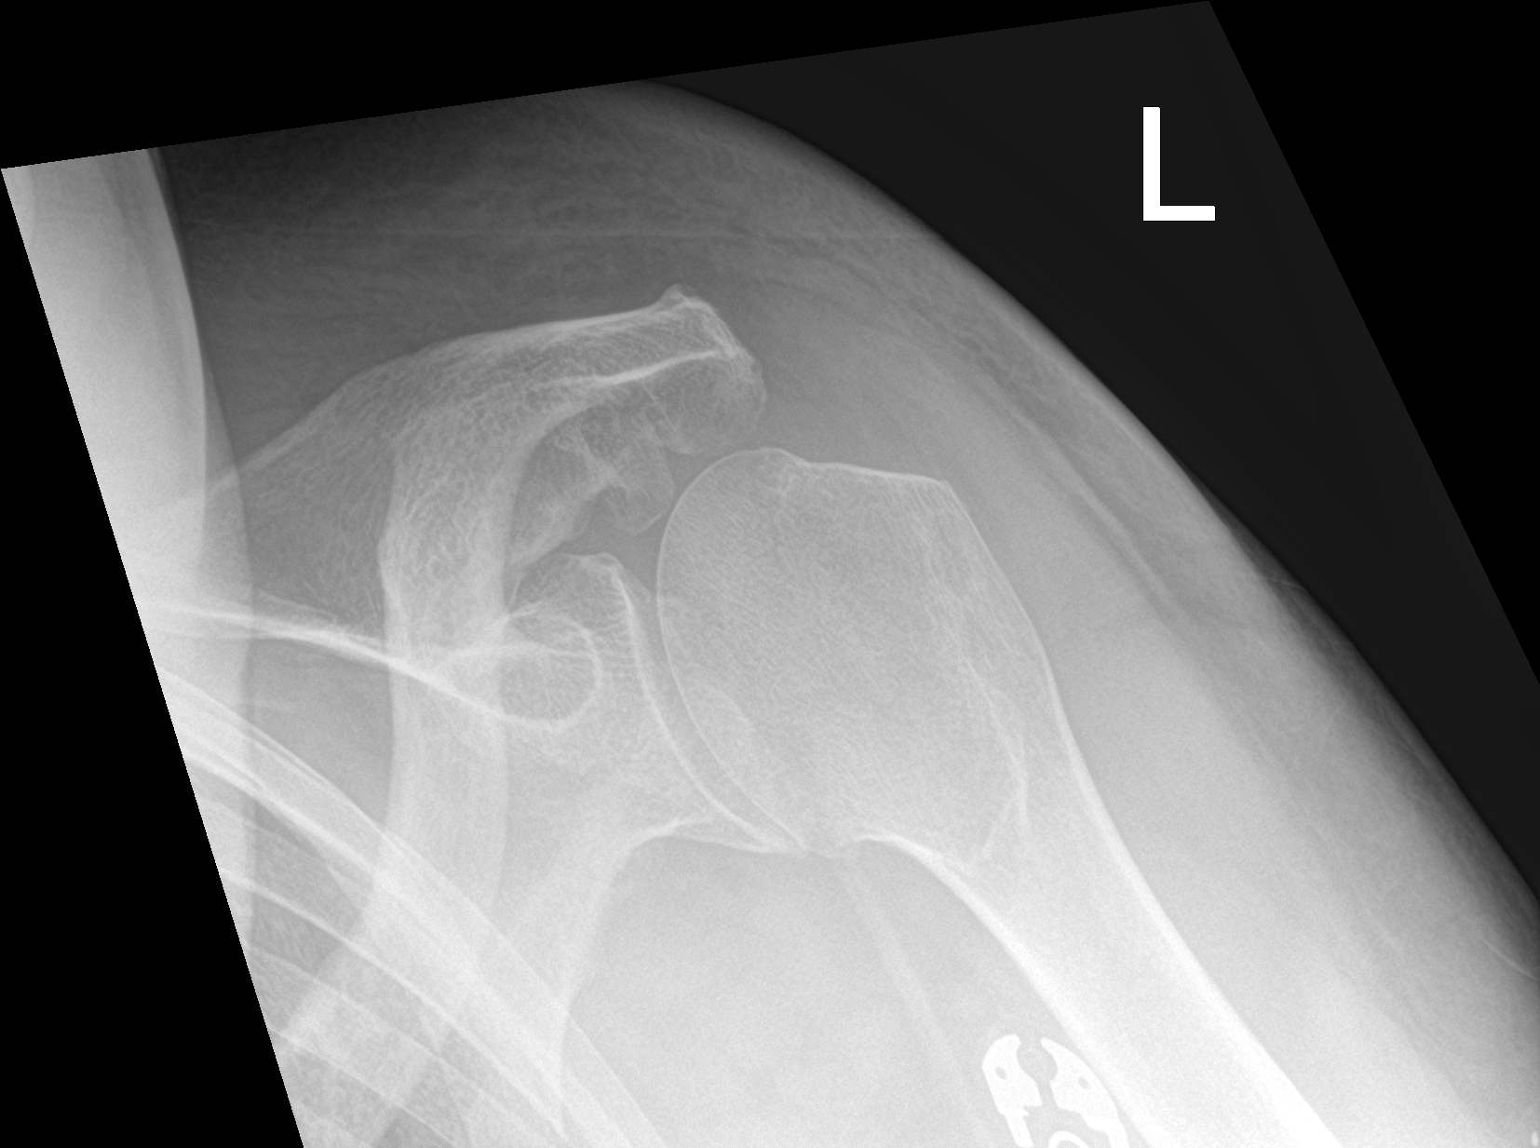

[shoulder y view]
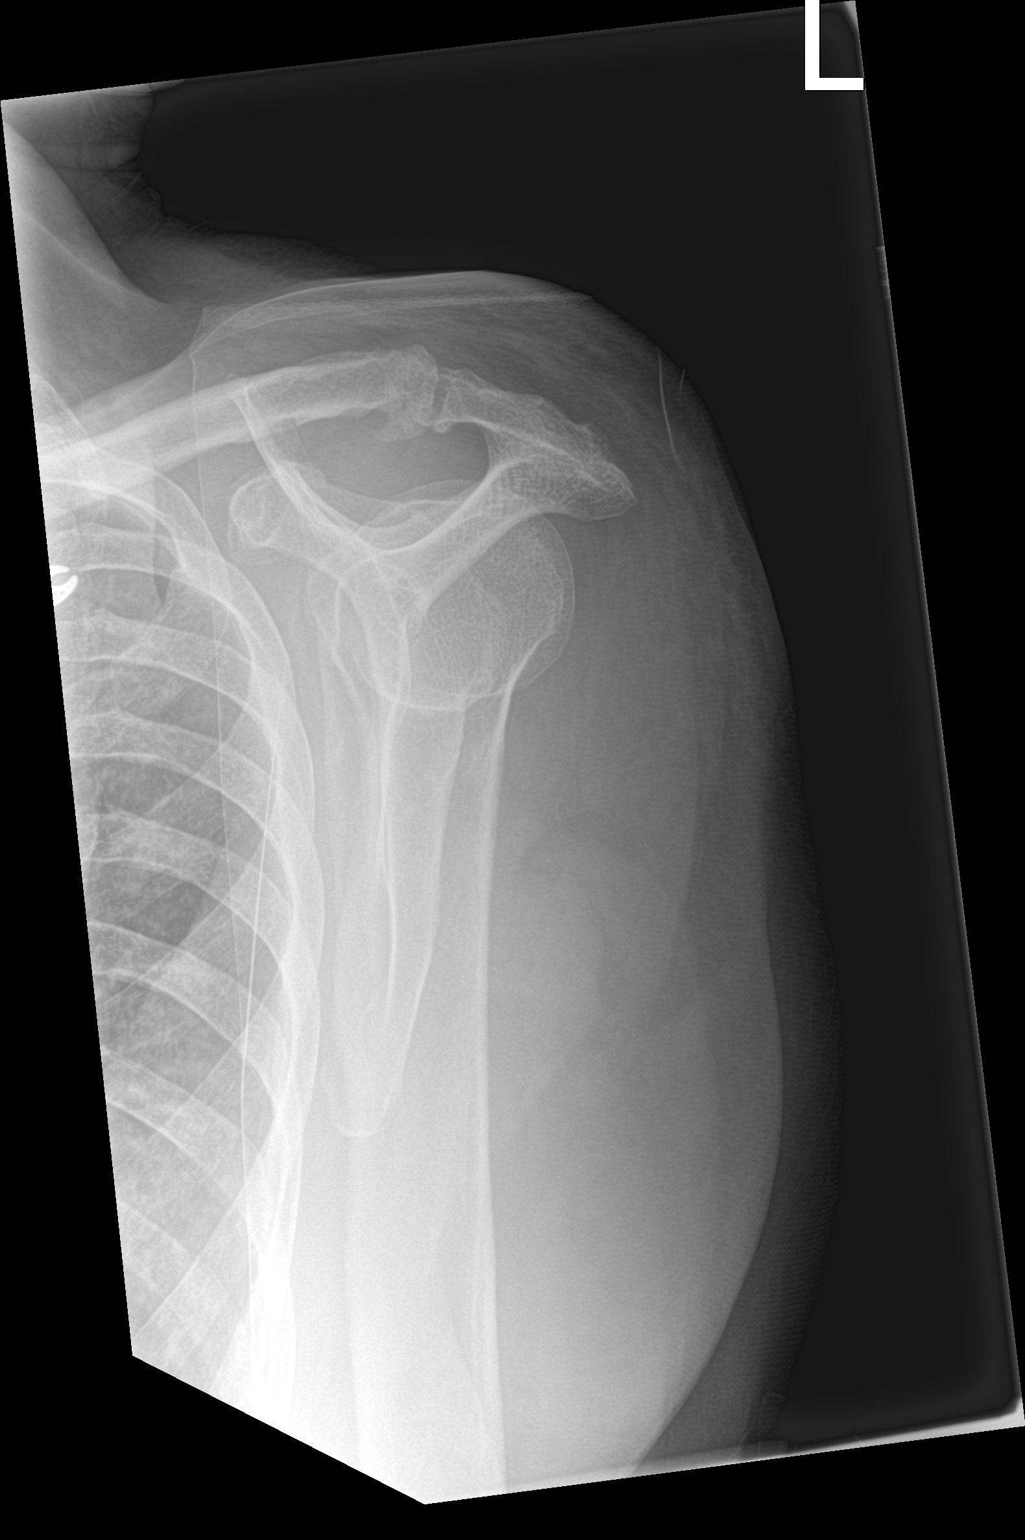

[shoulder ap neutral]
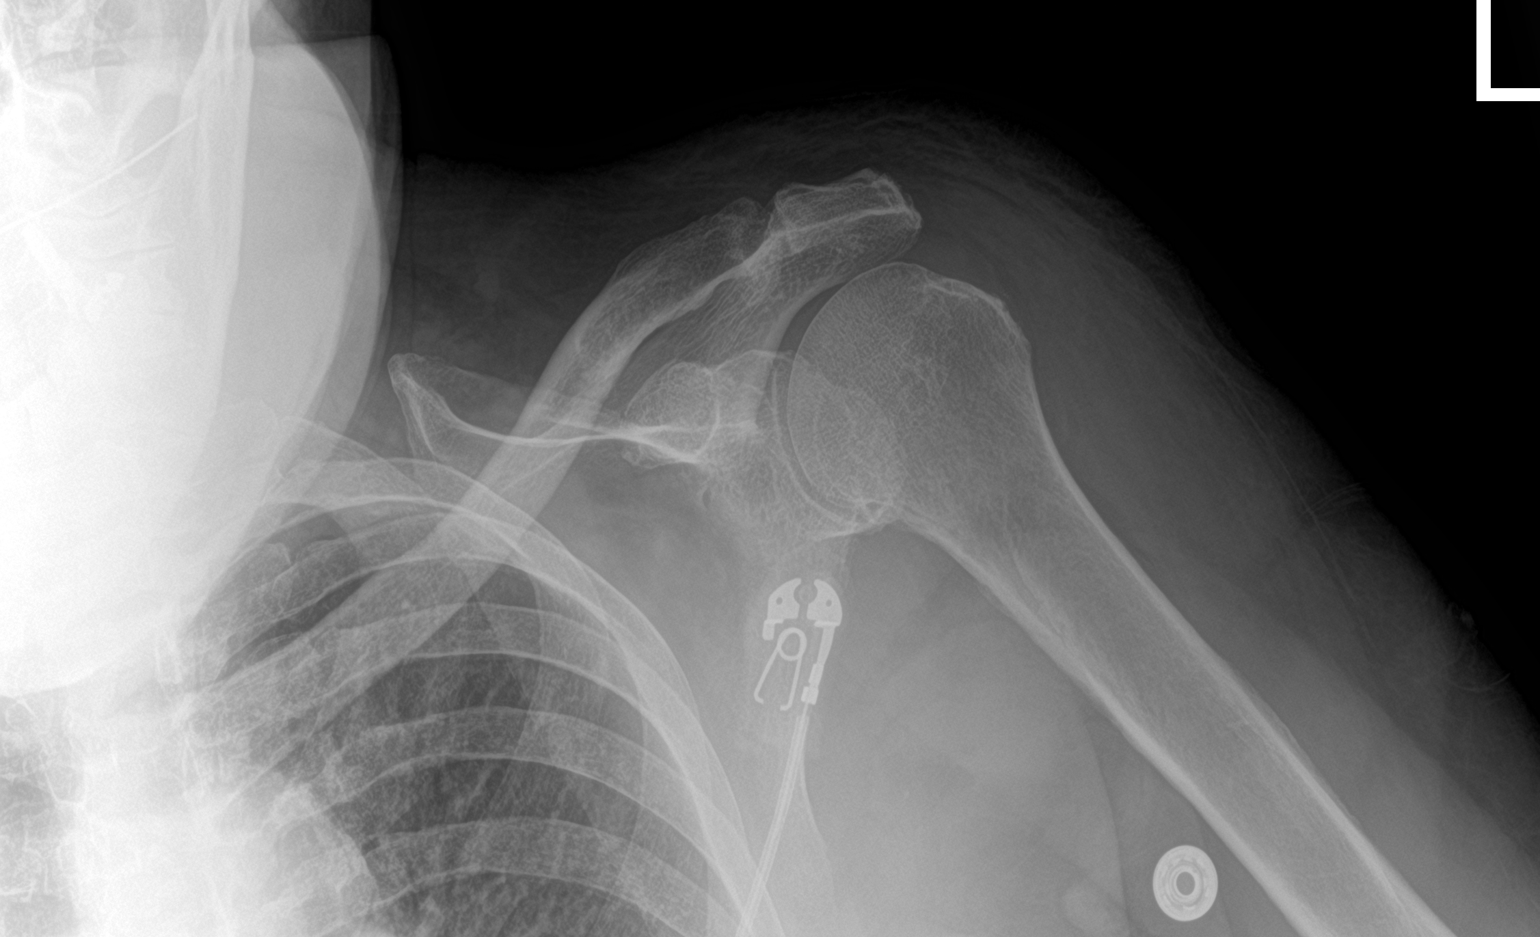

[3 of 3 positions shown; findings below may reference images not displayed]

FINDINGS: Degenerative changes in the left shoulder, most pronounced in the AC
joint with joint space narrowing and spurring. No acute bony
abnormality. Specifically, no fracture, subluxation, or dislocation.
IMPRESSION: No acute bony abnormality.

## 2021-12-20 IMAGING — MR MR HEAD W/O CM
11 of 15 series · 34 of 48 positions shown · non-contrast
Comparison: Head CT [DATE].
COMPARISON: Head CT [DATE].

Addendum:
CLINICAL DATA: Provided history: Fall. Additional history provided:
Fall with weakness. History of hypertension, PAF, DM2, HLD and gout.
Additional history obtained from electronic medical record: Patient
febrile and with mild leukocytosis.

EXAM:
MRI HEAD WITHOUT CONTRAST
TECHNIQUE: Multiplanar, multiecho pulse sequences of the brain and surrounding
structures were obtained without intravenous contrast.

[Series 5: DWI · axial · 3.0mm · 0.92mm/px · z∈[-83,+78]mm · 7 of 112 slices shown (1 of 4)]
[im 1/112]
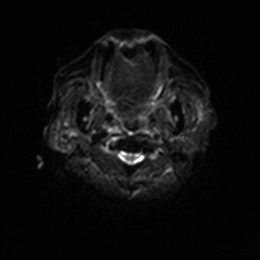
[im 19/112]
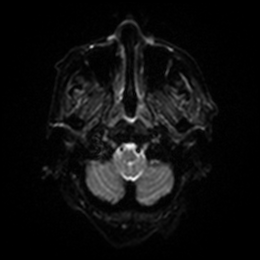
[im 38/112]
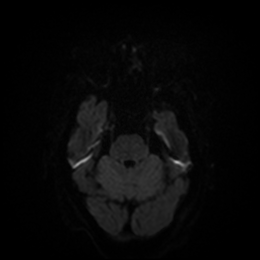
[im 56/112]
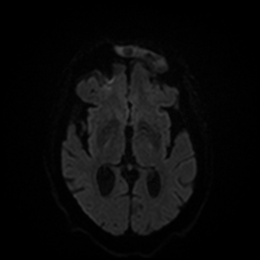
[im 75/112]
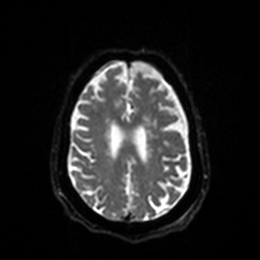
[im 93/112]
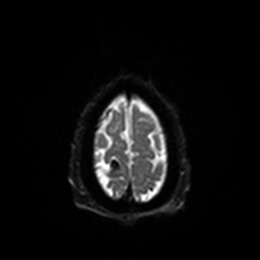
[im 112/112]
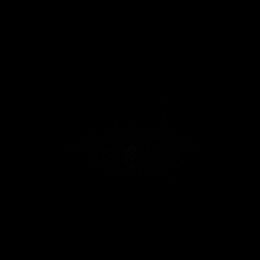

[Series 6: DWI · axial · 3.0mm · 0.92mm/px · z∈[-83,+75]mm · 3 of 51 slices shown (2 of 4)]
[im 1/51]
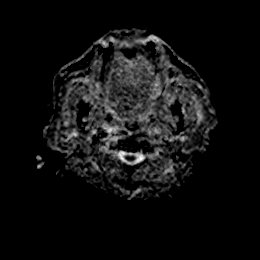
[im 26/51]
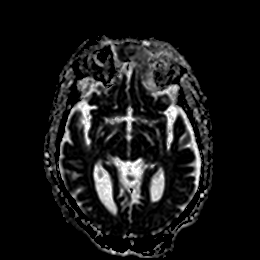
[im 51/51]
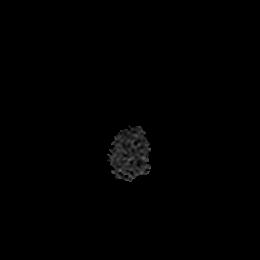

[Series 7: DWI · coronal · 4.0mm · 0.88mm/px · 5 of 80 slices shown (3 of 4)]
[im 1/80]
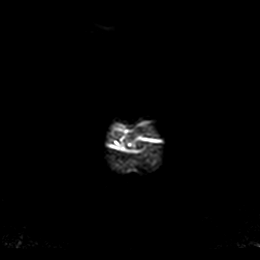
[im 20/80]
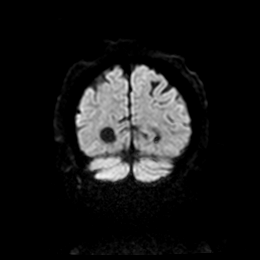
[im 40/80]
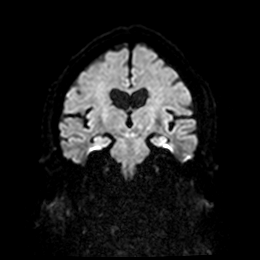
[im 60/80]
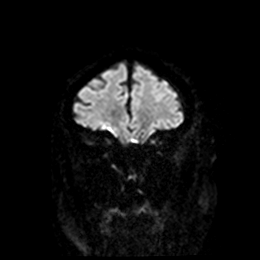
[im 80/80]
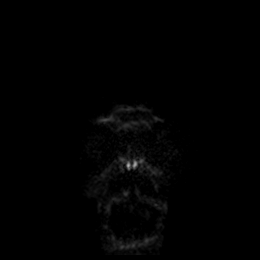

[Series 8: DWI · coronal · 4.0mm · 0.88mm/px · 3 of 40 slices shown (4 of 4)]
[im 1/40]
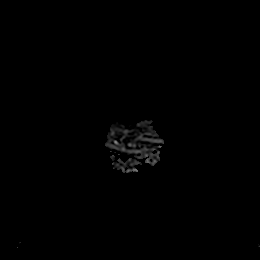
[im 20/40]
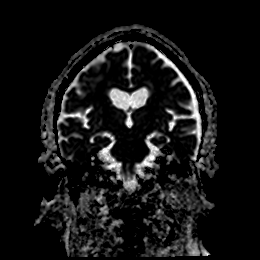
[im 40/40]
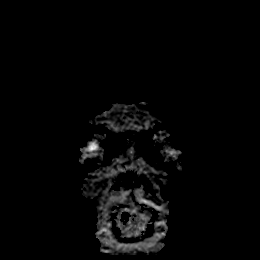

[Series 9: T1 · sagittal · 5.0mm · 0.75mm/px · 2 of 29 slices shown (1 of 2)]
[im 1/29]
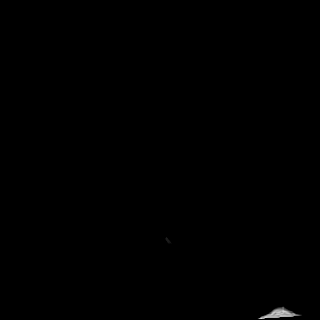
[im 29/29]
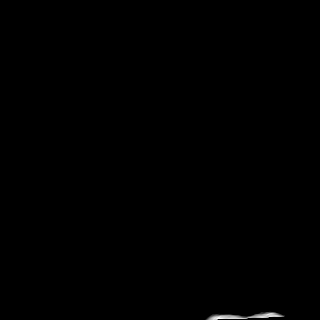

[Series 10: T2 · axial · 5.0mm · 0.75mm/px · z∈[-88,+76]mm · 2 of 29 slices shown (1 of 3)]
[im 1/29]
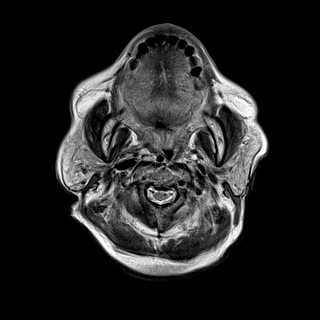
[im 29/29]
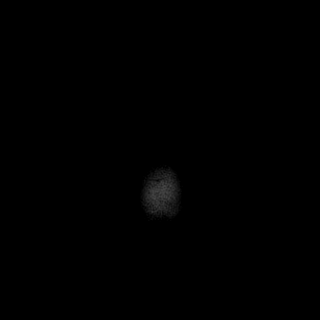

[Series 11: FLAIR · axial · 5.0mm · 0.94mm/px · z∈[-82,+76]mm · 2 of 28 slices shown]
[im 1/28]
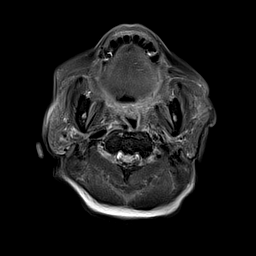
[im 28/28]
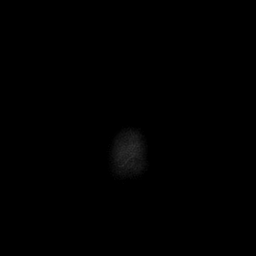

[Series 12: mag_images · axial · 3.0mm · 0.94mm/px · z∈[-82,+79]mm · 4 of 56 slices shown]
[im 1/56]
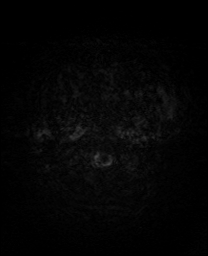
[im 19/56]
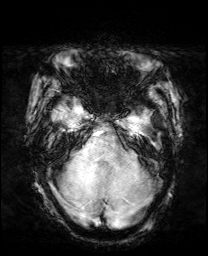
[im 37/56]
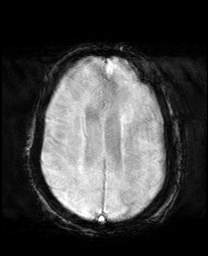
[im 56/56]
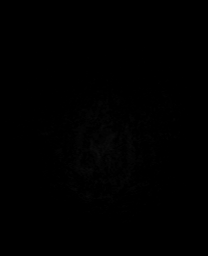

[Series 17: T2 · coronal · 5.0mm · 0.34mm/px · 2 of 34 slices shown (2 of 3)]
[im 1/34]
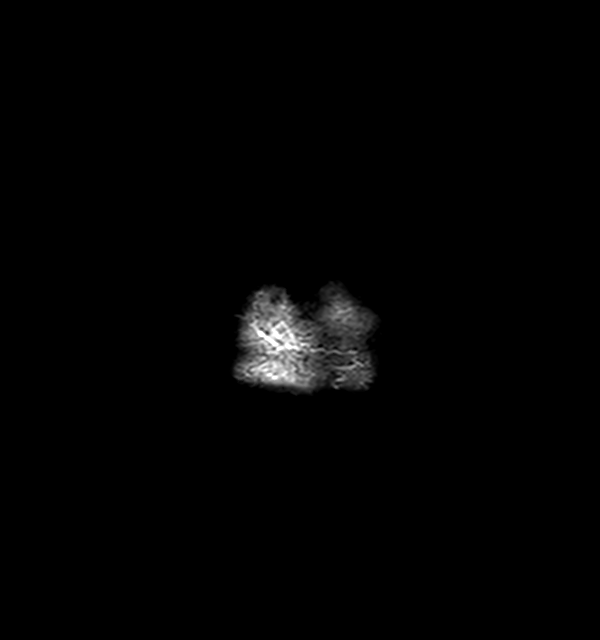
[im 34/34]
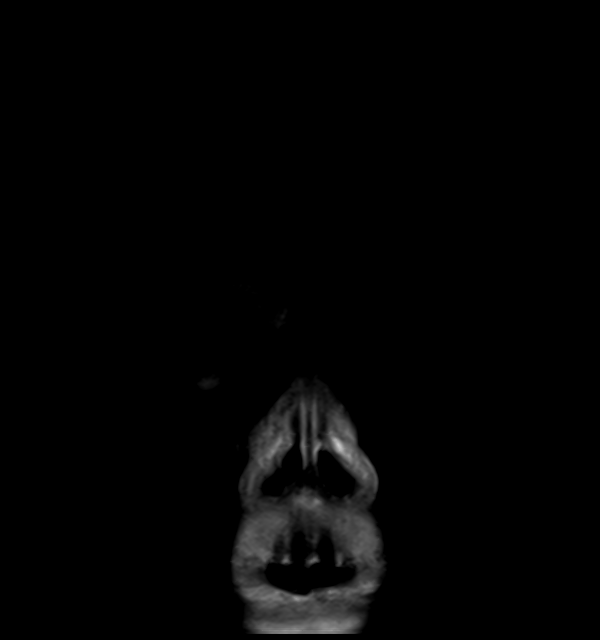

[Series 18: T1 · sagittal · 5.0mm · 0.94mm/px · 2 of 25 slices shown (2 of 2)]
[im 1/25]
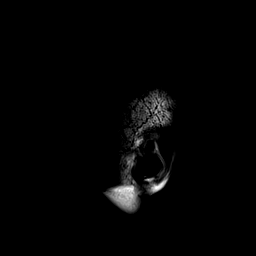
[im 25/25]
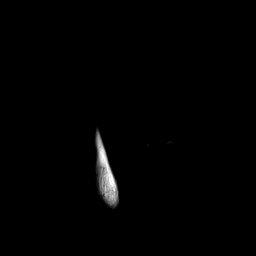

[Series 19: T2 · coronal · 5.0mm · 0.34mm/px · 2 of 34 slices shown (3 of 3)]
[im 1/34]
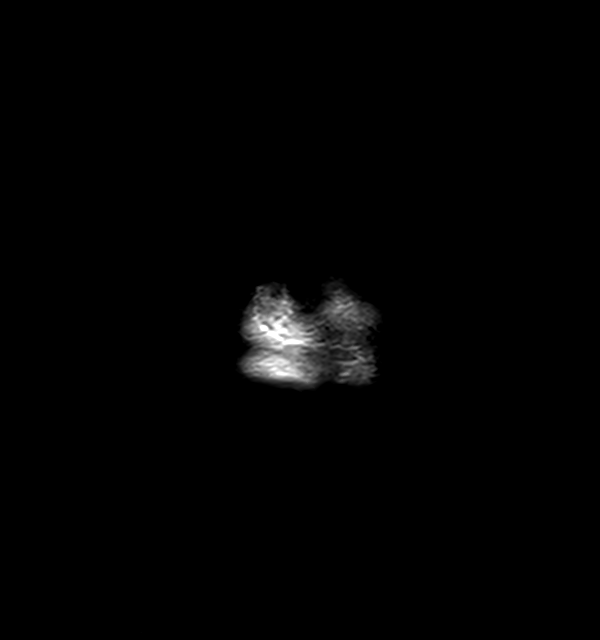
[im 34/34]
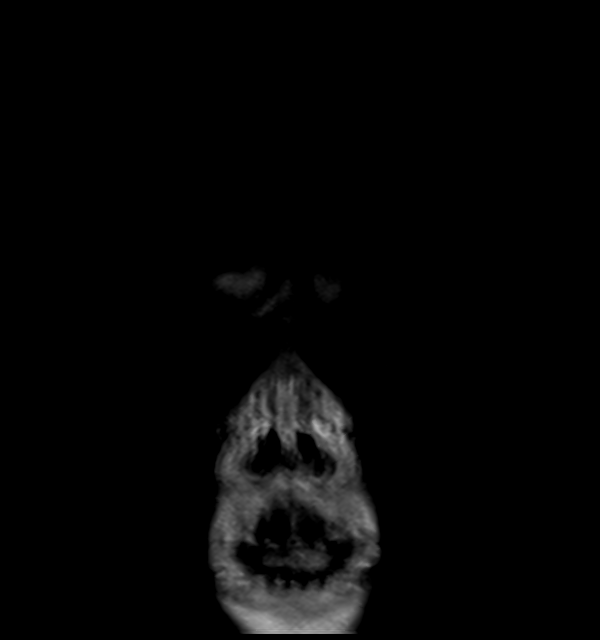

[34 of 48 positions shown; findings below may reference images not displayed]

FINDINGS: Brain:

Mild intermittent motion degradation.

Mild generalized cerebral and cerebellar atrophy.

There is extra-axial T2 FLAIR hyperintense signal abnormality and
corresponding restricted diffusion overlying the anterior left
frontal lobe, measuring up to 7 mm in thickness (for instance as
seen on series 11, image 18) (series 5, image 94). This signal
abnormality appears contiguous with extensive signal abnormality
opacifying the left frontal sinus. Additionally, irregular lucency
of the inner table of the left frontal sinus is noted on yesterday's
head CT (for instance as seen on series 4, image 38 of this prior
exam). This is highly suspicious for subdural empyema resulting from
severe left frontal sinusitis.

Redemonstrated 1.6 x 1.3 cm densely calcified lobular extra-axial
focus overlying posterior right frontal lobe, likely reflecting a
calcified meningioma. There is mild local mass effect upon the
underlying posterior right frontal lobe without appreciable
underlying parenchymal edema.

Mild for age multifocal T2 FLAIR hyperintense signal abnormality
within the cerebral white matter, nonspecific but compatible with
chronic small vessel ischemic disease.

There is no acute infarct.

No chronic intracranial blood products.

No midline shift.

Vascular: Maintained flow voids within the proximal large arterial
vessels.

Skull and upper cervical spine: Irregular lucency/erosion of the
inner table of the left frontal sinus better appreciated on same-day
head CT. Incompletely assessed cervical spondylosis.

Sinuses/Orbits: Visualized orbits show no acute finding. Complete T2
hyperintense opacification of the left frontal sinus. Extensive T2
hyperintense opacification of the anterior left ethmoid air cells.
Mild mucosal thickening elsewhere within the bilateral ethmoid
sinuses. Mild mucosal thickening within the bilateral maxillary
sinuses.

Other: Trace fluid within the left mastoid air cells.
IMPRESSION: Extra-axial T2 FLAIR hyperintense signal abnormality and
corresponding restricted diffusion overlying the anterior left
frontal lobe, measuring up to 7 mm in thickness. This signal
abnormality appears contiguous with severe left frontal sinus
disease. Additionally, irregular lucency/erosion of the inner table
of the left frontal sinus is noted on yesterday's head CT. These
findings are highly suspicious for subdural empyema resulting from
severe left frontal sinusitis. Neurosurgical consultation is
recommended.

1.6 x 1.3 cm densely calcified meningioma overlying the posterior
right frontal lobe. Local mass effect upon the underlying right
frontal lobe without underlying parenchymal edema.

Mild chronic small vessel ischemic changes within the cerebral white
matter.

Mild generalized parenchymal atrophy.

Additional paranasal sinus disease, as described, some of which has
a left ostiomeatal unit obstructive pattern.

ADDENDUM:
These results were called by telephone at the time of interpretation
on [DATE] at [DATE] to provider Dr. KASUME of the emergency
department, who verbally acknowledged these results.

*** End of Addendum ***
FINDINGS: Brain:

Mild intermittent motion degradation.

Mild generalized cerebral and cerebellar atrophy.

There is extra-axial T2 FLAIR hyperintense signal abnormality and
corresponding restricted diffusion overlying the anterior left
frontal lobe, measuring up to 7 mm in thickness (for instance as
seen on series 11, image 18) (series 5, image 94). This signal
abnormality appears contiguous with extensive signal abnormality
opacifying the left frontal sinus. Additionally, irregular lucency
of the inner table of the left frontal sinus is noted on yesterday's
head CT (for instance as seen on series 4, image 38 of this prior
exam). This is highly suspicious for subdural empyema resulting from
severe left frontal sinusitis.

Redemonstrated 1.6 x 1.3 cm densely calcified lobular extra-axial
focus overlying posterior right frontal lobe, likely reflecting a
calcified meningioma. There is mild local mass effect upon the
underlying posterior right frontal lobe without appreciable
underlying parenchymal edema.

Mild for age multifocal T2 FLAIR hyperintense signal abnormality
within the cerebral white matter, nonspecific but compatible with
chronic small vessel ischemic disease.

There is no acute infarct.

No chronic intracranial blood products.

No midline shift.

Vascular: Maintained flow voids within the proximal large arterial
vessels.

Skull and upper cervical spine: Irregular lucency/erosion of the
inner table of the left frontal sinus better appreciated on same-day
head CT. Incompletely assessed cervical spondylosis.

Sinuses/Orbits: Visualized orbits show no acute finding. Complete T2
hyperintense opacification of the left frontal sinus. Extensive T2
hyperintense opacification of the anterior left ethmoid air cells.
Mild mucosal thickening elsewhere within the bilateral ethmoid
sinuses. Mild mucosal thickening within the bilateral maxillary
sinuses.

Other: Trace fluid within the left mastoid air cells.
IMPRESSION: Extra-axial T2 FLAIR hyperintense signal abnormality and
corresponding restricted diffusion overlying the anterior left
frontal lobe, measuring up to 7 mm in thickness. This signal
abnormality appears contiguous with severe left frontal sinus
disease. Additionally, irregular lucency/erosion of the inner table
of the left frontal sinus is noted on yesterday's head CT. These
findings are highly suspicious for subdural empyema resulting from
severe left frontal sinusitis. Neurosurgical consultation is
recommended.

1.6 x 1.3 cm densely calcified meningioma overlying the posterior
right frontal lobe. Local mass effect upon the underlying right
frontal lobe without underlying parenchymal edema.

Mild chronic small vessel ischemic changes within the cerebral white
matter.

Mild generalized parenchymal atrophy.

Additional paranasal sinus disease, as described, some of which has
a left ostiomeatal unit obstructive pattern.

## 2021-12-20 MED ORDER — VANCOMYCIN HCL 750 MG/150ML IV SOLN
750.0000 mg | INTRAVENOUS | Status: DC
  Administered 2021-12-21 – 2021-12-23 (×3): 750 mg via INTRAVENOUS
  Filled 2021-12-20 (×3): qty 150

## 2021-12-20 MED ORDER — LACTATED RINGERS IV BOLUS
1000.0000 mL | Freq: Once | INTRAVENOUS | Status: AC
  Administered 2021-12-20: 1000 mL via INTRAVENOUS

## 2021-12-20 MED ORDER — VANCOMYCIN HCL 1500 MG/300ML IV SOLN
1500.0000 mg | Freq: Once | INTRAVENOUS | Status: AC
  Administered 2021-12-20: 1500 mg via INTRAVENOUS
  Filled 2021-12-20: qty 300

## 2021-12-20 MED ORDER — APIXABAN 5 MG PO TABS
5.0000 mg | ORAL_TABLET | Freq: Two times a day (BID) | ORAL | Status: DC
  Administered 2021-12-20 – 2021-12-23 (×6): 5 mg via ORAL
  Filled 2021-12-20 (×6): qty 1

## 2021-12-20 MED ORDER — THIAMINE HCL 100 MG/ML IJ SOLN: 100.0000 mg | Freq: Every day | INTRAMUSCULAR | Status: DC

## 2021-12-20 MED ORDER — VANCOMYCIN HCL IN DEXTROSE 1-5 GM/200ML-% IV SOLN: 1000.0000 mg | Freq: Once | INTRAVENOUS | Status: DC

## 2021-12-20 MED ORDER — SODIUM CHLORIDE 0.9 % IV SOLN: INTRAVENOUS | Status: DC

## 2021-12-20 MED ORDER — ADULT MULTIVITAMIN W/MINERALS CH
1.0000 | ORAL_TABLET | Freq: Every day | ORAL | Status: DC
  Administered 2021-12-21 – 2021-12-23 (×3): 1 via ORAL
  Filled 2021-12-20 (×3): qty 1

## 2021-12-20 MED ORDER — FOLIC ACID 1 MG PO TABS
1.0000 mg | ORAL_TABLET | Freq: Every day | ORAL | Status: DC
  Administered 2021-12-21 – 2021-12-23 (×3): 1 mg via ORAL
  Filled 2021-12-20 (×3): qty 1

## 2021-12-20 MED ORDER — SODIUM CHLORIDE 0.9 % IV SOLN
2.0000 g | Freq: Two times a day (BID) | INTRAVENOUS | Status: DC
  Administered 2021-12-21 – 2021-12-23 (×6): 2 g via INTRAVENOUS
  Filled 2021-12-20 (×6): qty 2

## 2021-12-20 MED ORDER — METRONIDAZOLE 500 MG/100ML IV SOLN
500.0000 mg | Freq: Once | INTRAVENOUS | Status: AC
  Administered 2021-12-20: 500 mg via INTRAVENOUS
  Filled 2021-12-20: qty 100

## 2021-12-20 MED ORDER — LACTATED RINGERS IV BOLUS
1000.0000 mL | Freq: Once | INTRAVENOUS | Status: AC
Start: 2021-12-20 — End: 2021-12-20
  Administered 2021-12-20: 1000 mL via INTRAVENOUS

## 2021-12-20 MED ORDER — INSULIN ASPART 100 UNIT/ML IJ SOLN
0.0000 [IU] | Freq: Three times a day (TID) | INTRAMUSCULAR | Status: DC
  Administered 2021-12-22 (×2): 1 [IU] via SUBCUTANEOUS
  Administered 2021-12-22: 5 [IU] via SUBCUTANEOUS
  Administered 2021-12-23: 1 [IU] via SUBCUTANEOUS

## 2021-12-20 MED ORDER — POTASSIUM CHLORIDE CRYS ER 20 MEQ PO TBCR
40.0000 meq | EXTENDED_RELEASE_TABLET | Freq: Once | ORAL | Status: AC
  Administered 2021-12-20: 40 meq via ORAL
  Filled 2021-12-20: qty 2

## 2021-12-20 MED ORDER — POTASSIUM CHLORIDE 10 MEQ/100ML IV SOLN
10.0000 meq | INTRAVENOUS | Status: AC
  Administered 2021-12-20 (×2): 10 meq via INTRAVENOUS
  Filled 2021-12-20 (×2): qty 100

## 2021-12-20 MED ORDER — SODIUM CHLORIDE 0.9 % IV SOLN
2.0000 g | Freq: Once | INTRAVENOUS | Status: AC
  Administered 2021-12-20: 2 g via INTRAVENOUS
  Filled 2021-12-20: qty 2

## 2021-12-20 MED ORDER — THIAMINE HCL 100 MG PO TABS
100.0000 mg | ORAL_TABLET | Freq: Every day | ORAL | Status: DC
  Administered 2021-12-21 – 2021-12-23 (×3): 100 mg via ORAL
  Filled 2021-12-20 (×3): qty 1

## 2021-12-20 NOTE — Progress Notes (Signed)
° °  [facility] Attending Admission Note: Dorris Singh, MD   For questions about this patient, please use [email or web address] to page the family medicine resident on call. Pager number [phone].    I  have seen and examined this patient, reviewed their chart. I have discussed this patient with the resident. Will sign resident note as able.   88 yo woman with history of atrial fibrillation on apixaban, type 2 diabetes, and hypertension presenting with a fall at home, found to have a febrile illness resulting in sepsis. Patient reports increasing falls approximately 1 month ago. Has had two falls this week. Has noticed worsening back pain. She thinks left leg pain and weakness started this week. Also endorses L shoulder pain after fall in bathroom today. No headaches, chest pain, dyspnea, nausea, vomiting. Has chronic L eye ptosis. She is not taking any medication currently. Denies symptoms of infection including cough, dysuria, hematuria, flank pain, vomiting, nausea. Febrile on arrival of EMS and here in ED.   ED course noted--- given 2 L fluid, noted to be febrile, blood and urine cultures collected. Started on vancomycin, cefepime and flagyl. Also given IV K.   PMH- Atrial fibrillation, type 2 DM, stroke, gout, takes no medication currently  PSH- noted  Socially-identifies as Jehovah's witness Lives alone. Has many friends. Likes to go on cruises-most recently to [place] (December). Drink 6-8 ounces of rum (or possibly another hard alcohol) each day. No smoking, no EtOH. Has 1 daughter (Copywriter, advertising in [place]), 3 grandsons. Does not drive. Daughter brings groceries. Ambulates independently at baseline.  Labs- notable for elevated CK,   Imaging- CT head, CXR, x-ray knee and  hip reviewed, has right sided small calcified meningioma   EKG- SR   Exam Vitals:   12/20/21 1500 12/20/21 1600  BP: 132/72 129/75  Pulse: 67 70  Resp: 17 (!) 21  Temp:    SpO2: 100% 100%   Tired elderly woman no  distress Talkative L eye with ptosis-- chronic per daughter  Normal fluent speech Alert to person, place, date, and why she is here RRR no murmurs Lungs coarse but without crackles Abdomen soft 1+ edema of L ankle as compared to right Reduced LLE as compared to right  Excellent UE strength   Recurrent falls, unclear if related to new left sided weakness or if left sided weakness new due to fall. Given findings on exam, suspect ankle may be culprit. Will proceed with additional imaging of L ankle and back. If negative or relatively unrevealing, will consider additional imaging. This has been ongoing 1 month, doubt acute stroke but does have many risk factors and off anticoagulation. May need additional home services to be independent again. PT/OT as below.  Sepsis, unclear source, no end organ damage, monitor cultures. Doubt intra-abdominal source, can discontinue Flagyl. Can likely de-escalate to ceftriaxone and monitor cultures for 24-48 hours.  Alcohol use, add CIWA and thiamine supplement.  Rhabdomyolysis, trend CK until <1500, aggressive IVF.  History of atrial fibrillation, telemetry monitoring, will discuss long term anticoagulation as patient has not been taking clearly at high risk for falls if living independently.  Hypokalemia, suspect very low intake + alcohol use, repeat, will supplement as needed.  Will sign resident note as available.

## 2021-12-20 NOTE — ED Provider Notes (Signed)
[facility] EM[facility] DEPARTMENT Provider Note   CSN: [id number] Arrival date & time: 12/20/21  1114     History  Chief Complaint  Patient presents with   Weakness    Chelsea Gallegos is a 88 y.o. female.  HPI 88 year old female presents with weakness and difficulty getting up.  History is from the patient as well as the nurse who spoke to EMS.  The patient fell in the shower.  It does not seem like she injured anything but then she states she did not have the strength to get up.  She was in the tub for several hours before family got there.  EMS was called and they found a temperature between 100-104 and gave her Tylenol.  Patient denies any other complaints and no painful complaint such as headache, chest pain or abdominal pain.  She states she has had some back pain for a few weeks.  Home Medications Prior to Admission medications   Medication Sig Start Date End Date Taking? Authorizing Provider  apixaban (ELIQUIS) 5 MG TABS tablet Take 1 tablet (5 mg total) by mouth 2 (two) times daily. 10/22/18   Glenis Smoker, MD  aspirin 81 MG chewable tablet Chew 162 mg by mouth once.    [provider]  atorvastatin (LIPITOR) 40 MG tablet Take 1 tablet (40 mg total) by mouth daily at 6 PM. Patient not taking: Reported on 07/09/2019 10/22/18   Glenis Smoker, MD  COSOPT PF 2-0.5 % SOLN ophthalmic solution Place 1 drop into both eyes 3 (three) times daily. 09/23/19   [provider]  digoxin (LANOXIN) 0.125 MG tablet Take 1 tablet (0.125 mg total) by mouth every Monday, Wednesday, and Friday. 10/23/18   Glenis Smoker, MD  diltiazem (CARDIZEM) 60 MG tablet Take 1 tablet (60 mg total) by mouth 2 (two) times daily. 10/22/18   Glenis Smoker, MD  dorzolamide (TRUSOPT) 2 % ophthalmic solution Place 1 drop into both eyes 2 (two) times daily. 09/09/18   [provider]  dorzolamide-timolol (COSOPT) 22.3-6.8 MG/ML ophthalmic  solution Place 1 drop into both eyes 2 (two) times daily. 09/11/18   [provider]  fluorometholone (FML) 0.1 % ophthalmic suspension Place 1 drop into both eyes 2 (two) times daily. 09/30/19   [provider]  metFORMIN (GLUCOPHAGE) 500 MG tablet Take 1 tablet (500 mg total) by mouth 2 (two) times daily with a meal. Patient not taking: Reported on 07/09/2019 10/22/18   Glenis Smoker, MD  potassium chloride (K-DUR,KLOR-CON) 10 MEQ tablet Take 1 tablet (10 mEq total) by mouth 2 (two) times daily. Patient not taking: Reported on 11/02/2019 10/22/18   Glenis Smoker, MD  prednisoLONE acetate (PRED FORTE) 1 % ophthalmic suspension INSTILL 1 DROP INTO EACH EYE TWICE DAILY 05/31/19   [provider]  prednisoLONE acetate (PRED FORTE) 1 % ophthalmic suspension 1 drop 4 times daily in the left eye for 7 days then stop Patient not taking: Reported on 11/02/2019 06/10/19   [name], MD      Allergies    Patient has no known allergies.    Review of Systems   Review of Systems  Constitutional:  Positive for fever (according to EMS).  Respiratory:  Negative for cough and shortness of breath.   Cardiovascular:  Negative for chest pain.  Gastrointestinal:  Negative for abdominal pain.  Musculoskeletal:  Positive for back pain.  Neurological:  Positive for weakness. Negative for headaches.   Physical Exam  Updated Vital Signs BP 129/75    Pulse 70    Temp (!) 100.6 F (38.1 C) (Rectal)    Resp (!) 21    Ht 5\' 2"  (1.575 m)    Wt 76 kg    SpO2 100%    BMI 30.65 kg/m  Physical Exam Vitals and nursing note reviewed.  Constitutional:      General: She is not in acute distress.    Appearance: She is well-developed. She is not ill-appearing or diaphoretic.  HENT:     Head: Normocephalic and atraumatic.  Cardiovascular:     Rate and Rhythm: Normal rate and regular rhythm.     Heart sounds: Normal heart sounds.  Pulmonary:     Effort: Pulmonary effort is  normal.     Breath sounds: Normal breath sounds. No wheezing or rales.  Abdominal:     General: There is no distension.     Palpations: Abdomen is soft.     Tenderness: There is no abdominal tenderness. There is no right CVA tenderness or left CVA tenderness.  Musculoskeletal:     Thoracic back: No tenderness.     Lumbar back: No tenderness.     Left hip: Tenderness present. Normal range of motion.     Left upper leg: No tenderness.     Left knee: No swelling. Normal range of motion. Tenderness present.     Comments: On exam she reports some discomfort to palpation throughout her left hip, thigh, and knee but no focal tenderness.  Skin:    General: Skin is warm and dry.  Neurological:     Mental Status: She is alert.     Comments: Patient is awake, alert, no slurred speech. 5/5 strength in BUE. RLE mildly weak but LLE has difficulty getting off stretcher. Some pain aspect in left leg    ED Results / Procedures / Treatments   Labs (all labs ordered are listed, but only abnormal results are displayed) Labs Reviewed  LACTIC ACID, PLASMA - Abnormal; Notable for the following components:      Result Value   Lactic Acid, Venous 2.0 (*)    All other components within normal limits  COMPREHENSIVE METABOLIC PANEL - Abnormal; Notable for the following components:   Potassium 2.6 (*)    Glucose, Bld 222 (*)    Calcium 8.3 (*)    Albumin 2.7 (*)    AST 72 (*)    GFR, Estimated 59 (*)    All other components within normal limits  CBC WITH DIFFERENTIAL/PLATELET - Abnormal; Notable for the following components:   WBC 11.9 (*)    Neutro Abs 10.1 (*)    All other components within normal limits  URINALYSIS, ROUTINE W REFLEX MICROSCOPIC - Abnormal; Notable for the following components:   APPearance HAZY (*)    Hgb urine dipstick LARGE (*)    Protein, ur 100 (*)    Leukocytes,Ua TRACE (*)    Bacteria, UA RARE (*)    All other components within normal limits  CK - Abnormal; Notable for the  following components:   Total CK 3,327 (*)    All other components within normal limits  RESP PANEL BY RT-PCR (FLU A&B, COVID) ARPGX2  CULTURE, BLOOD (ROUTINE X 2)  CULTURE, BLOOD (ROUTINE X 2)  URINE CULTURE  LACTIC ACID, PLASMA  PROTIME-INR  APTT  MAGNESIUM    EKG EKG Interpretation  Date/Time:  Thursday December 20 2021 11:23:31 EST Ventricular Rate:  89 PR Interval:  128 QRS  Duration: 199 QT Interval:  404 QTC Calculation: 492 R Axis:   -35 Text Interpretation: Sinus rhythm Consider right atrial enlargement Left ventricular hypertrophy Borderline prolonged QT interval Confirmed by Sherwood Gambler [id number]) on 12/20/2021 12:31:02 PM  Radiology DG Chest 1 View  Result Date: 12/20/2021 CLINICAL DATA:  Weakness, possible fall EXAM: CHEST  1 VIEW COMPARISON:  Chest radiograph 10/20/2018 FINDINGS: The heart is at the upper limits of normal for size. The upper mediastinal contours are normal. There is no focal consolidation or pulmonary edema. There is no pleural effusion or pneumothorax There is no acute osseous abnormality. IMPRESSION: No radiographic evidence of acute cardiopulmonary process. Electronically Signed   By: Valetta Mole M.D.   On: 12/20/2021 12:18   CT Head Wo Contrast  Result Date: 12/20/2021 CLINICAL DATA:  Acute neuro deficit with weakness. Suspected stroke. EXAM: CT HEAD WITHOUT CONTRAST TECHNIQUE: Contiguous axial images were obtained from the base of the skull through the vertex without intravenous contrast. RADIATION DOSE REDUCTION: This exam was performed according to the departmental dose-optimization program which includes automated exposure control, adjustment of the mA and/or kV according to patient size and/or use of iterative reconstruction technique. COMPARISON:  None. FINDINGS: Brain: Mildly enlarged ventricles and cortical sulci. Mild patchy white matter low density in both cerebral hemispheres. Oval, densely calcified mass at the right vertex measuring 1.6 x  1.2 cm on sagittal image number 26/6. No intracranial hemorrhage or CT findings suspicious for acute infarction. Vascular: No hyperdense vessel or unexpected calcification. Skull: Normal. Negative for fracture or focal lesion. Sinuses/Orbits: Completely opacified left frontal sinus. Extensive mucosal thickening with air cell opacification in the left ethmoid sinus. Mucosal thickening in the inferior right frontal sinus and anterior right ethmoid sinus. Small bilateral maxillary sinus retention cysts. Status post bilateral cataract extraction. Other: None. IMPRESSION: 1. No acute abnormality. 2. 1.6 cm densely calcified meningioma at the right vertex with no associated edema. 3. Mild diffuse cerebral and cerebellar atrophy. 4. Mild chronic white matter ischemic changes in both cerebral hemispheres, slightly more pronounced and confluent in the left frontal lobe. 5. Chronic sinusitis. Electronically Signed   By: Claudie Revering M.D.   On: 12/20/2021 12:30   DG Knee Complete 4 Views Left  Result Date: 12/20/2021 CLINICAL DATA:  Fall. EXAM: LEFT KNEE - COMPLETE 4+ VIEW COMPARISON:  None. FINDINGS: No evidence of fracture, dislocation, or joint effusion. Mild narrowing of medial joint space is noted. Soft tissues are unremarkable. IMPRESSION: Mild degenerative joint disease is noted medially. No acute abnormality seen. Electronically Signed   By: Marijo Conception M.D.   On: 12/20/2021 12:16   DG Hip Unilat W or Wo Pelvis 2-3 Views Left  Result Date: 12/20/2021 CLINICAL DATA:  Fall. EXAM: DG HIP (WITH OR WITHOUT PELVIS) 2-3V LEFT COMPARISON:  None. FINDINGS: There is no evidence of hip fracture or dislocation. Mild osteophyte formation is seen involving the left hip. 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 No acute abnormality is noted. Electronically Signed   By: Marijo Conception M.D.   On: 12/20/2021 12:17    Procedures .Critical Care Performed by: Sherwood Gambler, MD Authorized by:  Sherwood Gambler, MD   Critical care provider statement:    Critical care time (minutes):  35   Critical care time was exclusive of:  Separately billable procedures and treating other patients   Critical care was necessary to treat or prevent imminent or life-threatening deterioration of the following conditions:  Sepsis   Critical  care was time spent personally by me on the following activities:  Development of treatment plan with patient or surrogate, discussions with consultants, evaluation of patient's response to treatment, examination of patient, ordering and review of laboratory studies, ordering and review of radiographic studies, ordering and performing treatments and interventions, pulse oximetry, re-evaluation of patient's condition and review of old charts    Medications Ordered in ED Medications  vancomycin (VANCOREADY) IVPB 1500 mg/300 mL (1,500 mg Intravenous New Bag/Given 12/20/21 1452)  potassium chloride 10 mEq in 100 mL IVPB (10 mEq Intravenous New Bag/Given 12/20/21 1546)  vancomycin (VANCOREADY) IVPB 750 mg/150 mL (has no administration in time range)  ceFEPIme (MAXIPIME) 2 g in sodium chloride 0.9 % 100 mL IVPB (has no administration in time range)  lactated ringers bolus 1,000 mL (0 mLs Intravenous Stopped 12/20/21 1402)  ceFEPIme (MAXIPIME) 2 g in sodium chloride 0.9 % 100 mL IVPB (0 g Intravenous Stopped 12/20/21 1401)  metroNIDAZOLE (FLAGYL) IVPB 500 mg (0 mg Intravenous Stopped 12/20/21 1451)  lactated ringers bolus 1,000 mL (1,000 mLs Intravenous New Bag/Given 12/20/21 1402)  potassium chloride SA (KLOR-CON M) CR tablet 40 mEq (40 mEq Oral Given 12/20/21 1453)    ED Course/ Medical Decision Making/ A&P Clinical Course as of 12/20/21 1617  Thu Dec 20, 2021  1304 Lactate 2.0 [SG]    Clinical Course User Index [SG] Sherwood Gambler, MD                           Medical Decision Making  Patient initially presents with a low-grade fever and stable vital signs.  Thus  sepsis work-up was started though no code sepsis until her lactate has come back at 2.0.  I think her generalized weakness is likely stemming from her febrile illness.  Due to this, she was given broad IV antibiotics for unclear source at this time.  She does have a mild leukocytosis as well as a significant hypokalemia that was repleted with oral and IV potassium.  Magnesium is normal.  First lactate is 2.0 which would qualify as severe sepsis.  I have interpreted and reviewed the CT and x-ray images and there are no acute findings including no pneumonia or obvious head bleed.  No fractures.  I discussed with the daughter at the bedside.  Patient has fallen a couple times in the last couple days.  They found her in the tub when I went to get her to take her to the doctor today.  She has been a little bit confused according to the daughter though she seems oriented here.  Patient does have an elevated CK that would go along with some mild rhabdomyolysis.  At this point, she will need continued work-up and treatment.  I have discussed with the family practice service who will admit.  She may need MRIs given she has some left-sided leg weakness though they would like to evaluate her first.        Final Clinical Impression(s) / ED Diagnoses Final diagnoses:  Sepsis with encephalopathy without septic shock, due to unspecified organism [facility])    Rx / DC Orders ED Discharge Orders     None         Sherwood Gambler, MD 12/20/21 1619

## 2021-12-20 NOTE — Sepsis Progress Note (Signed)
[facility] is monitoring this code sepsis 

## 2021-12-20 NOTE — H&P (Addendum)
[facility] Admission History and Physical Service Pager: [phone]  Patient name: Chelsea Gallegos Medical record number: [id number] Date of birth: 1934/02/21 Age: 88 y.o. Gender: female  Primary Care Provider: Zola Button, MD Consultants: None Code Status: DNI which was confirmed with family and patient.  Preferred Emergency Contact: Beecher Mcardle (daughter) [phone]  Chief Complaint: Fall  Assessment and Plan: DIVINE IMBER is a 88 y.o. female presenting with fall and weakness. PMH is significant for HTN, PAF, DM2, HLD, and gout.   Fall   Weakness Patient presents after a fall where she lost her balance while getting out of the tub at 4 AM this morning. Patient was down until family found her, and called EMS to get her back up. When EMS arrived she was reported to have a fever of 104. EMS brought her to [facility]. In the ED her BP was 129/75 with a pulse of 70, a RR of 21, and a temperature of 100.6. Her labs were significant for a lactic acid of 2.0, a WBC of 11.9, with Abs Neut 10.1, K of 2.6, Glc 222, AST 72, CK of 3,327, and a UA that was positive for large hgb, 100 protein, rare bacteria, and trace leukocytes. Her hip, knee, and chest x-ray imaging were benign with no acute abnormality. She also received a CT head that showed no acute abnormality. She met SIRS criteria and was started on IV vancomycin and cefepime. Her K was repleted with oral and IV K. Her neuro exam was largely benign except for some left leg weakness. Differential for this patient's weakness includes SIRS, as she meets criteria, but lacks a source of infection. Patient unlikely to be suffering from PNA given normal chest imaging, and lack of symptoms. Urine labs were concerning for trace leukocytes and rare bacteria, but UTI less likely.  We will however obtain urine culture.  Patient weakness could be 2/2 a previous stroke, explaining her recurrent falls.  She does however have less  ankle edema that is unexplained as well as pain on the dorsum of the foot.  Will obtain x-ray.  If bony x-ray negative will obtain MRI brain and CT spine.  Patient weakness could also be 2/2 deconditioning.  We will plan to have her evaluated by PT OT.  Of note patient lives alone and does not use any assistive devices for walking. -Admit to FPTS, Dr. Dorris Singh - Partial code; no intubation confirmed with patient - Carb modified diet -Continue Vanc and Cefipime - A.m. CBC, BMP - A.m. CK - NS IV 175 mL/HR - Follow-up urine culture, blood culture -PT/OT eval and treat -Follow-up xray: L. Foot, L. Ankle, L. Shoulder, Lumbar spine -Consider MRI brain and CT cervical spine if x-rays normal  SIRS Patient arrived febrile to 100.6, with a respiratory rate of 21. Blood cultures were collected, and patient was started on broad spectrum antibiotics with Vanc and Cefipime. Patient lac on admission was 2.0, but trended down to 1.8. WBC was 11.9. Patient UA with low concern for UTI. PT CXR with no concern for PNA. Source of infection undetermined.  -Continue Vanc and Cefipime -Monitor for fever -Start mIVF - Follow-up urine culture, blood culture  Elevated CK   Rhabdomyolysis Patient had fall this morning around 4 am, and was found down in the bathroom by her daughter around 10 am. Patient CK on admission was 3,327, with a UA positive for large hgb, and 100 protein.  -Continue to trend CK -Start mIVF  HTN BP on admission 129/75. Patient home med diltiazem 60 mg twice daily. -Hold home BP med; consider adding back home meds if becomes hypertensive - Routine vital signs  PAF Patient EKG normal sinus on admission. Patient on Eliquis 5 mg twice daily, aspirin 162 mg daily, and digoxin 0.125. Patient reports she is not taking her medicine.  -Start Eliquis 5 mg -Hold digoxin & ASA - Continue to monitor med telemetry  T2DM Patient with history of type 2 diabetes, on metformin 500 mg BID. Hgb A1c  6.6 in 2019. Patient reports she is not taking her medicine. -Hold home meds -Hgb A1c -initiate sSSI - CBG monitoring 4 times daily before meals and at bedtime  Hypokalemia Patient K was 2.6 on admission. Patient received repletion with IV and oral K -AM BMP -Continue to trend replete as needed  HLD Patient last lipid panel was 2019 LDL 123, and has home medication: Atorvastatin 40 mg daily. Patient reports she is not taking her medicine. -Follow-up AM Lipid panel - Restart home medication when appropriate  ETOH use Patient with history of drinking ETOH daily, last drink was Sunday. Patient drinks 2-3 shots worth of liquor in a sitting, x1 a day. -CIWA precautions  FEN/GI: Carb modified/heart healthy Prophylaxis: Full dose eliquis  Disposition: Admit, INP med telemetry  History of Present Illness:  Chelsea Gallegos is a 88 y.o. female presenting with weakness 2/2 being stuck in the bathtub after a fall. Hx of previous fall 2 days prior and took an hour to get up. Patient got up early to take a shower around 4 am, slipped and fell in the bathtub. She remained there until neighbors called her daughter, neighbors were there to take patient to doctor's appointment for first fall, and noticed she was not answering the door. Daughter found her in the tub and called EMS to help get her up around 1030 am. Patient complained of back and foot pain. Patient states she did not feel dizzy, lightheaded, or weak at time of fall. Patient denied any nausea, vomiting, diarrhea, headache, or cough. Patient did note she's had increased rhinorrhea, but attributed it to a tea she drank.    Review Of Systems: Per HPI with the following additions:   Review of Systems  Constitutional:  Positive for activity change. Negative for chills and fever.  HENT:  Negative for congestion and sore throat.   Respiratory:  Negative for cough and shortness of breath.   Cardiovascular:  Negative for chest pain.   Gastrointestinal:  Negative for abdominal pain, diarrhea, nausea and vomiting.  Musculoskeletal:  Positive for arthralgias and back pain.  Neurological:  Positive for weakness. Negative for dizziness, light-headedness, numbness and headaches.    Patient Active Problem List   Diagnosis Date Noted   Healthcare maintenance 12/14/2018   Hypokalemia 10/29/2018   Hypotension 10/21/2018   Atrial fibrillation with rapid ventricular response ([place]) 10/20/2018   GERD (gastroesophageal reflux disease)    Paroxysmal atrial fibrillation ([place]) 01/08/2017   Right ankle pain 01/07/2017   Acute gout of right ankle 01/07/2017   Chest pain 05/20/2015   Hypertension 02/27/2012   Hyperlipidemia 02/27/2012   Diabetes type 2, controlled ([place]) 02/27/2012    Past Medical History: Past Medical History:  Diagnosis Date   Arthritis    "arms" (10/20/2018)   Asthma    Diabetic retinopathy ([place])    NPDR OU   GERD (gastroesophageal reflux disease)    Glaucoma    POAG OU   History of gout  History of hiatal hernia    History of kidney stones    Hyperlipidemia ~2005   Hypertension    Hypertensive retinopathy    OU   Refusal of blood transfusions as patient is Jehovah's Witness    Stroke [facility]) 04/2017   "dr told me I had a stroke in my left eye" (10/20/2018)   Type II diabetes mellitus ([place])     Past Surgical History: Past Surgical History:  Procedure Laterality Date   CATARACT EXTRACTION Bilateral    [name]   CATARACT EXTRACTION, BILATERAL Bilateral 2018   CHOLECYSTECTOMY     pt does not recall this OR (10/20/2018)   CYSTOSCOPY W/ STONE MANIPULATION     EXCISIONAL HEMORRHOIDECTOMY  1980s/1990s   "& he cut my muscle that controls my bowels"   EYE SURGERY     FRACTURE SURGERY     GLAUCOMA SURGERY Bilateral 2018   HERNIA REPAIR     KNEE ARTHROSCOPY Left 04/2012    WITH MEDIAL MENISECTOMY/notes 04/16/2012   LEFT HEART CATH AND CORONARY ANGIOGRAPHY N/A 10/21/2018   Procedure: LEFT HEART  CATH AND CORONARY ANGIOGRAPHY;  Surgeon: Dixie Dials, MD;  Location: [facility];  Service: Cardiovascular;  Laterality: N/A;   PATELLA FRACTURE SURGERY Right ~ 4536   UMBILICAL HERNIA REPAIR  2000s    Social History: Social History   Tobacco Use   Smoking status: Former    Packs/day: 3.00    Years: 20.00    Pack years: 60.00    Types: Cigarettes    Quit date: 12/09/1972    Years since quitting: 49.0   Smokeless tobacco: Never  Vaping Use   Vaping Use: Never used  Substance Use Topics   Alcohol use: Yes    Alcohol/week: 1.0 standard drink    Types: 1 Standard drinks or equivalent per week   Drug use: Never   Additional social history: Patient drinks 2-3 standard drinks worth of ETOH a night in one sitting. Last drink was Sunday.    Family History: Family History  Problem Relation Age of Onset   Hypertension Mother    Hypertension Father    Breast cancer Daughter     Allergies and Medications: No Known Allergies No current facility-administered medications on file prior to encounter.   Current Outpatient Medications on File Prior to Encounter  Medication Sig Dispense Refill   apixaban (ELIQUIS) 5 MG TABS tablet Take 1 tablet (5 mg total) by mouth 2 (two) times daily. 60 tablet 0   aspirin 81 MG chewable tablet Chew 162 mg by mouth once.     atorvastatin (LIPITOR) 40 MG tablet Take 1 tablet (40 mg total) by mouth daily at 6 PM. (Patient not taking: Reported on 07/09/2019) 30 tablet 1   COSOPT PF 2-0.5 % SOLN ophthalmic solution Place 1 drop into both eyes 3 (three) times daily.     digoxin (LANOXIN) 0.125 MG tablet Take 1 tablet (0.125 mg total) by mouth every Monday, Wednesday, and Friday. 15 tablet 3   diltiazem (CARDIZEM) 60 MG tablet Take 1 tablet (60 mg total) by mouth 2 (two) times daily. 60 tablet 3   dorzolamide (TRUSOPT) 2 % ophthalmic solution Place 1 drop into both eyes 2 (two) times daily.  12   dorzolamide-timolol (COSOPT) 22.3-6.8 MG/ML ophthalmic  solution Place 1 drop into both eyes 2 (two) times daily.  4   fluorometholone (FML) 0.1 % ophthalmic suspension Place 1 drop into both eyes 2 (two) times daily.  metFORMIN (GLUCOPHAGE) 500 MG tablet Take 1 tablet (500 mg total) by mouth 2 (two) times daily with a meal. (Patient not taking: Reported on 07/09/2019) 60 tablet 0   potassium chloride (K-DUR,KLOR-CON) 10 MEQ tablet Take 1 tablet (10 mEq total) by mouth 2 (two) times daily. (Patient not taking: Reported on 11/02/2019) 60 tablet 3   prednisoLONE acetate (PRED FORTE) 1 % ophthalmic suspension INSTILL 1 DROP INTO EACH EYE TWICE DAILY     prednisoLONE acetate (PRED FORTE) 1 % ophthalmic suspension 1 drop 4 times daily in the left eye for 7 days then stop (Patient not taking: Reported on 11/02/2019) 10 mL 0    Objective: BP 132/72    Pulse 67    Temp (!) 100.6 F (38.1 C) (Rectal)    Resp 17    Ht '5\' 2"'  (1.575 m)    Wt 76 kg    SpO2 100%    BMI 30.65 kg/m  Exam: General: Elderly, frail, cooperative, jovial, NAD, African American woman Eyes: EOM intact, pupils reactive to light ENTM: MMM Neck: Full range of motion Cardiovascular: RRR, NRMG Respiratory: CTABL, no wheezes or rhonci Gastrointestinal: Soft, NTTP, non-distended MSK: Left shoulder pain with use/palpation, lower back pain, left ankle slightly more swollen then right, non ttp Derm: No rash or lesion Neuro: Strength 5/5 in bilateral UE, 4/5 in RLE, 3/5 in LLE. No facial asymmetry or dysarthria. CN II: PERRL CN III, IV,VI: EOMI CV V: Normal sensation in V1, V2, V3 CVII: Symmetric smile and brow raise CN VIII: Normal hearing CN IX,X: Symmetric palate raise  CN XI: 5/5 shoulder shrug CN XII: Symmetric tongue protrusion  Normal sensation in UE and LE bilaterally   Psych: Good mood, jovial affect  Labs and Imaging: CBC BMET  Recent Labs  Lab 12/20/21 1138  WBC 11.9*  HGB 12.8  HCT 37.9  PLT 209   Recent Labs  Lab 12/20/21 1138  NA 136  K 2.6*  CL 103  CO2 23   BUN 10  CREATININE 0.94  GLUCOSE 222*  CALCIUM 8.3*     EKG: My own interpretation (not copied from electronic read) Sinus Rhythm with borderline prolonged QTC at 492.    Holley Bouche, MD PGY-1, [facility] pager: [phone], text pages welcome  FPTS Upper-Level Resident Addendum   I have independently interviewed and examined the patient. I have discussed the above with the original author and agree with their documentation. My edits for correction/addition/clarification are in within the documentation. Please see also any attending notes.   Gerlene Fee, DO PGY-3, [facility] 12/20/2021 8:31 PM  FPTS Service pager: [phone] (text pages welcome through [facility])

## 2021-12-20 NOTE — Progress Notes (Signed)
Pharmacy Antibiotic Note  Chelsea Gallegos is a 88 y.o. female admitted on 12/20/2021 with sepsis of unknown source.  Pharmacy has been consulted for vancomycin and cefepime dosing.  WBC elevated today at 11.9, patient currently febrile with T 100.80F, lactic acid elevated at 2. Renal function appears to be at baseline.  Plan: Cefepime 2g IV q 12h Vancomycin 1500 mg IV x 1 Vancomycin 750mg  IV q 24h (estimated AUC 413) Metronidazole per MD Monitor renal function and signs of clinical improvement Follow-up cultures  Height: 5\' 2"  (157.5 cm) Weight: 76 kg (167 lb 8.8 oz) IBW/kg (Calculated) : 50.1  Temp (24hrs), Avg:100.2 F (37.9 C), Min:99.8 F (37.7 C), Max:100.6 F (38.1 C)  Recent Labs  Lab 12/20/21 1138  WBC 11.9*  LATICACIDVEN 2.0*    CrCl cannot be calculated (Patient's most recent lab result is older than the maximum 21 days allowed.).    No Known Allergies  Antimicrobials this admission: 1/12 vancomycin >>  1/12 cefepime >>  1/12 metronidazole >>  Dose adjustments this admission: none  Microbiology results: 1/12 BCx: pending 1/12 UCx: pending   Thank you for involving pharmacy in this patient's care.  Elita Quick, PharmD PGY1 Ambulatory Care Pharmacy Resident 12/20/2021 1:11 PM  **Pharmacist phone directory can be found on [email or web address] listed under [place]**

## 2021-12-20 NOTE — ED Triage Notes (Signed)
Pt to ER via EMS states she was in the tub around 4AM and was unable to get out of tub.  Pt may have had fall, but this is unclear.  Pt was unable to get out of tub until her family came to check on her.  Pt is alert and oriented, but family states she is more altered than normal.  Reports of no PO intake for up to 24 hours.  Reports that pt is also noncompliant with medications.

## 2021-12-20 NOTE — Hospital Course (Addendum)
Chelsea Gallegos is a 88 y.o. female presenting with fall and weakness. PMH is significant for HTN, PAF, DM2, HLD, and gout. Her medical course is below.  Fall   Weakness Patient presented with a fall after losing her balance the morning of admission.  In the ED she was noted to have a fever, started on broad-spectrum antibiotics including vancomycin and cefepime.  Because she had had multiple falls recently a more thorough work-up was done including CT head and cervical spine which showed no acute changes.  She then had an MRI brain which showed concern for subdural empyema with recommendation for neurosurgical consultation.  PT and OT were consulted and they recommended CIR.  This was however put on hold due to subdural empyema, see below.  SIRS   Sepsis 2/2 subdural empyema Patient presented to the ED after a fall but was found to have a fever.  She was started on vancomycin and cefepime.  Further investigation including MRI revealed what appeared to be a subdural empyema.  Neurology and ENT were consulted.  It was ultimately determined that the patient needed a higher level of care and she was transferred to [facility].  The transfer occurred on 1/15.  Elevated CK   Rhabdomyolysis Patient was noted to have an elevated CK at time of admission of 3327, this improved to 1111 on 1/14.  Thought likely due to trauma from her fall.  Patient was transferred to [facility] on 12/23/2021 for higher level of care for her subdural empyema.  Issues for PCP f/u: 1.6 cm densely calcified meningioma at the right vertex with no associated edema seen on MRI. Recommend outpatient physical therapy if she does not get an inpatient physical therapy rehab program.

## 2021-12-21 ENCOUNTER — Inpatient Hospital Stay (HOSPITAL_COMMUNITY): Payer: Medicare PPO

## 2021-12-21 DIAGNOSIS — A419 Sepsis, unspecified organism: Secondary | ICD-10-CM | POA: Diagnosis not present

## 2021-12-21 LAB — CBC
HCT: 35.5 % — ABNORMAL LOW (ref 36.0–46.0)
Hemoglobin: 11.9 g/dL — ABNORMAL LOW (ref 12.0–15.0)
MCH: 31.3 pg (ref 26.0–34.0)
MCHC: 33.5 g/dL (ref 30.0–36.0)
MCV: 93.4 fL (ref 80.0–100.0)
Platelets: 183 10*3/uL (ref 150–400)
RBC: 3.8 MIL/uL — ABNORMAL LOW (ref 3.87–5.11)
RDW: 13.2 % (ref 11.5–15.5)
WBC: 11.8 10*3/uL — ABNORMAL HIGH (ref 4.0–10.5)
nRBC: 0 % (ref 0.0–0.2)

## 2021-12-21 LAB — BASIC METABOLIC PANEL
Anion gap: 10 (ref 5–15)
Anion gap: 9 (ref 5–15)
BUN: 9 mg/dL (ref 8–23)
BUN: 10 mg/dL (ref 8–23)
CO2: 24 mmol/L (ref 22–32)
CO2: 21 mmol/L — ABNORMAL LOW (ref 22–32)
Calcium: 8.1 mg/dL — ABNORMAL LOW (ref 8.9–10.3)
Calcium: 8.2 mg/dL — ABNORMAL LOW (ref 8.9–10.3)
Chloride: 106 mmol/L (ref 98–111)
Chloride: 106 mmol/L (ref 98–111)
Creatinine, Ser: 0.82 mg/dL (ref 0.44–1.00)
Creatinine, Ser: 0.8 mg/dL (ref 0.44–1.00)
GFR, Estimated: >60 mL/min (ref >60)
GFR, Estimated: >60 mL/min (ref >60)
Glucose, Bld: 114 mg/dL — ABNORMAL HIGH (ref 70–99)
Glucose, Bld: 154 mg/dL — ABNORMAL HIGH (ref 70–99)
Potassium: 3.2 mmol/L — ABNORMAL LOW (ref 3.5–5.1)
Potassium: 3.5 mmol/L (ref 3.5–5.1)
Sodium: 140 mmol/L (ref 135–145)
Sodium: 136 mmol/L (ref 135–145)

## 2021-12-21 LAB — CK: Total CK: 2301 U/L — ABNORMAL HIGH (ref 38–234)

## 2021-12-21 LAB — LIPID PANEL
Cholesterol: 148 mg/dL (ref 0–200)
HDL: 52 mg/dL (ref >40)
LDL Cholesterol: 86 mg/dL (ref 0–99)
Total CHOL/HDL Ratio: 2.8 RATIO
Triglycerides: 49 mg/dL (ref <150)
VLDL: 10 mg/dL (ref 0–40)

## 2021-12-21 LAB — CBG MONITORING, ED
Glucose-Capillary: 72 mg/dL (ref 70–99)
Glucose-Capillary: 98 mg/dL (ref 70–99)

## 2021-12-21 LAB — HEMOGLOBIN A1C
Hgb A1c MFr Bld: 6.2 % — ABNORMAL HIGH (ref 4.8–5.6)
Mean Plasma Glucose: 131.24 mg/dL

## 2021-12-21 LAB — HIV ANTIBODY (ROUTINE TESTING W REFLEX): HIV Screen 4th Generation wRfx: NONREACTIVE

## 2021-12-21 MED ORDER — POTASSIUM CHLORIDE CRYS ER 20 MEQ PO TBCR
40.0000 meq | EXTENDED_RELEASE_TABLET | Freq: Two times a day (BID) | ORAL | Status: DC
  Administered 2021-12-21 (×2): 40 meq via ORAL
  Filled 2021-12-21 (×2): qty 2

## 2021-12-21 MED ORDER — ALBUTEROL SULFATE (2.5 MG/3ML) 0.083% IN NEBU
2.5000 mg | INHALATION_SOLUTION | Freq: Once | RESPIRATORY_TRACT | Status: AC
  Administered 2021-12-21: 2.5 mg via RESPIRATORY_TRACT
  Filled 2021-12-21: qty 3

## 2021-12-21 MED ORDER — ENSURE ENLIVE PO LIQD
237.0000 mL | Freq: Two times a day (BID) | ORAL | Status: DC
  Administered 2021-12-21 – 2021-12-23 (×5): 237 mL via ORAL

## 2021-12-21 MED ORDER — ACETAMINOPHEN 325 MG PO TABS
650.0000 mg | ORAL_TABLET | Freq: Four times a day (QID) | ORAL | Status: DC | PRN
  Administered 2021-12-21 – 2021-12-23 (×3): 650 mg via ORAL
  Filled 2021-12-21 (×3): qty 2

## 2021-12-21 NOTE — Progress Notes (Signed)
NEW ADMISSION NOTE New Admission Note:   Arrival Method: stretcher Mental Orientation: A&OX4 Telemetry: 5M20 Assessment: Completed Skin: intact dry IV: Lwrist LFA Pain: 0/10 Tubes: none Safety Measures: Safety Fall Prevention Plan has been given, discussed and signed Admission: Completed [facility] Orientation: Patient has been orientated to the room, unit and staff.  Family: none at bedside   Orders have been reviewed and implemented. Will continue to monitor the patient. Call light has been placed within reach and bed alarm has been activated.   Niang Mitcheltree S Davine Coba, RN

## 2021-12-21 NOTE — Progress Notes (Signed)
Initial Nutrition Assessment  DOCUMENTATION CODES:   Not applicable  INTERVENTION:   - Ensure Enlive po BID, each supplement provides 350 kcal and 20 grams of protein  - MVI with minerals daily  - Encourage PO intake  NUTRITION DIAGNOSIS:   Increased nutrient needs related to acute illness (sepsis) as evidenced by estimated needs.  GOAL:   Patient will meet greater than or equal to 90% of their needs  MONITOR:   PO intake, Supplement acceptance, Labs, Weight trends  REASON FOR ASSESSMENT:   Malnutrition Screening Tool    ASSESSMENT:   88 year old female who presented to the ED on 1/12 with weakness. PMH of HTN, PAF, T2DM, HLD, gout, EtOH abuse. Pt admitted with sepsis, rhabdomyolysis.  Spoke with pt in room. Pt on bedside commode at time of RD visit. RD offered to return at a later time but pt insisted RD could speak with her then. Pt reports appetite is okay. She states that she had milk, rice, and cake for lunch today and that this is the first meal that she has had since Wednesday of this week. RD noted pt confused at time, stating she will pay RD or laughing inappropriately. Difficult to determine how much of the history that pt provided is factual.  Pt states that she normally eats well. Pt states that she eats all throughout the day rather than eating full meals at one time. Pt thinks that she has lost weight but is unsure why this happened. Pt reports that she used to weigh 214 lbs and now weighs 168 lbs. Pt reports that her weight loss occurred over several years but is unsure of exact timeframe.  Reviewed weight history in chart. Last available weight PTA is from January 2020 and is 76.7 kg which is consistent with current weight of 76 kg. No evidence of weight loss.  Pt amenable to consuming oral nutrition supplement during admission. RD to order Ensure Enlive BID. Continue MVI with minerals daily.  Medications reviewed and include: folic acid, SSI, MVI with  minerals, klor-con 40 mEq BID, thiamine, IV abx IVF: NS @ 175 ml/hr  Labs reviewed: potassium 3.2, WBC 11.8 CBG's: 98, 72  I/O's: +3.5 L since admit  NUTRITION - FOCUSED PHYSICAL EXAM:  Flowsheet Row Most Recent Value  Orbital Region No depletion  Upper Arm Region No depletion  Thoracic and Lumbar Region No depletion  Buccal Region Mild depletion  Temple Region Mild depletion  Clavicle Bone Region Mild depletion  Clavicle and Acromion Bone Region Mild depletion  Scapular Bone Region No depletion  Dorsal Hand No depletion  Patellar Region No depletion  Anterior Thigh Region Mild depletion  Posterior Calf Region No depletion  Edema (RD Assessment) None  Hair Reviewed  Eyes Reviewed  Mouth Reviewed  Skin Reviewed  Nails Reviewed       Diet Order:   Diet Order             Diet Carb Modified Fluid consistency: Thin; Room service appropriate? Yes  Diet effective now                   EDUCATION NEEDS:   Not appropriate for education at this time  Skin:  Skin Assessment: Reviewed RN Assessment  Last BM:  no documented BM  Height:   Ht Readings from Last 1 Encounters:  12/20/21 5\' 2"  (1.575 m)    Weight:   Wt Readings from Last 1 Encounters:  12/20/21 76 kg    BMI:  Body  mass index is 30.65 kg/m.  Estimated Nutritional Needs:   Kcal:  1650-1850  Protein:  85-100 grams  Fluid:  1.6-1.8 L    Gustavus Bryant, MS, RD, LDN Inpatient Clinical Dietitian Please see [facility] for contact information.

## 2021-12-21 NOTE — Progress Notes (Signed)
FPTS Brief Progress Note  S: Patient resting in bed with no acute complaints or issues with pain.  HIV test came back nonreactive.  Neurosurgery was consulted by day team about concern for subdural empyema as possible source of infection.  O: BP 121/69 (BP Location: Right Arm)    Pulse 81    Temp 98.6 F (37 C)    Resp 18    Ht 5\' 2"  (1.575 m)    Wt 76 kg    SpO2 99%    BMI 30.65 kg/m    General: NAD, A&OX4, laughing Head: Normocephalic atraumatic CV: Regular rate and rhythm no murmurs rubs or gallops Respiratory: Clear to ausculation bilaterally,no increased work of breathing Abdomen: Soft, non-tender, non-distended  A/P: -Monitor fever curve -Will await neurosurgery recommendations  - Orders reviewed. Labs for AM ordered, which was adjusted as needed.   Gerrit Heck, MD 12/21/2021, 8:38 PM PGY-1, [facility] Night Resident  Please page [phone] with questions.

## 2021-12-21 NOTE — Progress Notes (Signed)
Family Medicine Teaching Service Daily Progress Note Intern Pager: [phone]  Patient name: Chelsea Gallegos Medical record number: [id number] Date of birth: 08-24-1934 Age: 88 y.o. Gender: female  Primary Care Provider: Zola Button, MD Consultants: None Code Status: DNI  Pt Overview and Major Events to Date:  12/20/21 - Patient admitted  Assessment and Plan:  Fall   Weakness Patient was complaining of foot, back and shoulder pain after fall yesterday. Imaging of L. Foot, L. Ankle, L. Shoulder, Lumbar spine, and CT cervical spine, all came back benign without acute issue. MRI brain concerning for subdural empyema resulting from severe left frontal sinusitis disease. Walked patient this morning, needed minimal support, good balance.  -Neurosurgery consult -PT/OT eval and treat  SIRS   Sepsis Patient was febrile to 100.5 overnight, and received tylenol 650 mg. Patient had a stable pulse, and tachypnea over night. BP was stable with a range of 90-120's/50-60's, notably more 90-100's/50's more recently. Will f/u Urine Cx & Blood Cx. Patient still meets SIRS criteria, but lacks a source of infection. MRI concerning for subdural empyema and possible source of infection. -HIV test -Continue Vanc and Cefipime -Continue mIVF -F/u Urine culture, blood culture  Elevated CK   Rhabdomyolysis Patient came in after fall with prolonged time down. CK on arrival was 3,327. UA was positive for large hgb and 100 protein. CK this morning was 2,301. -continue to trend until CK < 1500 -Continue mIVF  HTN   Hypotension BP was stable with a range of 90-120's/50-60's, notably more 90-100's/50's more recently.  PAF Patient not in A. Fib on tele. Rate was 60-70's -Continue Eliquis 5 mg  T2DM Patient home med metformin, held overnight, and placed on SSI. Last Hgb A1c was 6.6 in 2019. HgbA1c today is 6.2. Patient is prediabetic. CBG's have been 114 - 222. -Continue holding home metformin -Continue  sSSI -CBG qac&qhs  Hypokalemia K was 2.6 on admission. Patient received IV and oral K repletion. K this am was 3.2. Patient was repleted -40 meq K PO  HLD Patient last lipid panel was 2019, with LDL 123. Patient prescribed atorvastatin 40 mg daily, with poor med compliance. Lab Results  Component Value Date   CHOL 148 12/21/2021   HDL 52 12/21/2021   LDLCALC 86 12/21/2021   TRIG 49 12/21/2021   CHOLHDL 2.8 12/21/2021  -Restart statin when appropriate  ETOH use CIWA 0 this morning. Patient with history of ETOH use daily, multiple drinks in one sitting.  -Continue to monitor  FEN/GI: Carb modified PPx: Full dose Eliquis Dispo:Pending PT recommendations  pending clinical improvement .   Subjective:  Patient feels well and has no complaints. She's not been feeling ill, denies cough. Appreciates that foot, back, and ankle pain is decreased to soreness.   Objective: Temp:  [98.6 F (37 C)-100.6 F (38.1 C)] 98.6 F (37 C) (01/13 0400) Pulse Rate:  [64-92] 65 (01/13 0600) Resp:  [15-28] 18 (01/13 0600) BP: (92-141)/(52-104) 102/52 (01/13 0600) SpO2:  [96 %-100 %] 97 % (01/13 0600) Weight:  [76 kg] 76 kg (01/12 1128) Physical Exam: General: Well appearing, comfortable in bed, good mood, NAD, African American woman Cardiovascular: RRR, [facility] Respiratory: minimal diffuse wheezing, normal work of breathing Abdomen: Soft, NTTP, non-distended Extremities: Moving all extremities independently, no edema  Laboratory: Recent Labs  Lab 12/20/21 1138 12/21/21 0330  WBC 11.9* 11.8*  HGB 12.8 11.9*  HCT 37.9 35.5*  PLT 209 183   Recent Labs  Lab 12/20/21 1138 12/21/21 0330  NA  136 140  K 2.6* 3.2*  CL 103 106  CO2 23 24  BUN 10 9  CREATININE 0.94 0.82  CALCIUM 8.3* 8.1*  PROT 6.5  --   BILITOT 1.2  --   ALKPHOS 57  --   ALT 28  --   AST 72*  --   GLUCOSE 222* 114*      Imaging/Diagnostic Tests:   Holley Bouche, MD 12/21/2021, 6:35 AM PGY-1, [place] Intern pager: [phone], text pages welcome

## 2021-12-21 NOTE — Progress Notes (Signed)
FPTS Brief Progress Note  S:Met Chelsea Gallegos and she had no acute concerns or complaints.    O: BP (!) 114/104    Pulse 88    Temp 98.9 F (37.2 C) (Oral)    Resp 15    Ht _0  (1.575 m)    Wt 76 kg    SpO2 97%    BMI 30.65 kg/m    General: Well appearing, NAD, awake, alert, responsive to questions CV: Sinus rhythm on tele Respiratory: no increased work of breathing  A/P: -F/u MRI and CT spine  -Plan per H&P - Orders reviewed. Labs for AM ordered, which was adjusted as needed.    Gerrit Heck, MD 12/21/2021, 12:14 AM PGY-1, [facility] Night Resident  Please page [phone] with questions.

## 2021-12-21 NOTE — Plan of Care (Signed)
  Problem: Education: Goal: Knowledge of General Education information will improve Description Including pain rating scale, medication(s)/side effects and non-pharmacologic comfort measures Outcome: Progressing   

## 2021-12-21 NOTE — ED Notes (Signed)
ED TO INPATIENT HANDOFF REPORT  ED Nurse Name and Phone #: Cindie Laroche [phone]  S Name/Age/Gender Chelsea Gallegos 88 y.o. female Room/Bed: 046C/046C  Code Status   Code Status: Partial Code  Home/SNF/Other Home Patient oriented to: self, place, time, and situation Is this baseline? Yes   Triage Complete: Triage complete  Chief Complaint Weakness [R53.1]  Triage Note Pt to ER via EMS states she was in the tub around 4AM and was unable to get out of tub.  Pt may have had fall, but this is unclear.  Pt was unable to get out of tub until her family came to check on her.  Pt is alert and oriented, but family states she is more altered than normal.  Reports of no PO intake for up to 24 hours.  Reports that pt is also noncompliant with medications.   Allergies Allergies  Allergen Reactions   Netarsudil Other (See Comments)    Other reaction(s): Other (See Comments) Red with like blood running out of her eyes Red with like blood running out of her eyes    Tafluprost (Pf) Other (See Comments)    Other reaction(s): Other (See Comments) Red with like blood running out of her eyes Red with like blood running out of her eyes     Level of Care/Admitting Diagnosis ED Disposition     ED Disposition  [place]: [place] [[id number]]  Level of Care: Telemetry Medical [104]  May admit patient to [facility] or [facility] if equivalent level of care is available:: No  Covid Evaluation: Confirmed COVID Negative  Diagnosis: Weakness [[id number]]  Admitting Physician: Martyn Malay [[id number]]  Attending Physician: Martyn Malay [[id number]]  Estimated length of stay: past midnight tomorrow  Certification:: I certify this patient will need inpatient services for at least 2 midnights          B Medical/Surgery History Past Medical History:  Diagnosis Date   Arthritis    "arms" (10/20/2018)   Asthma    Diabetic retinopathy  ([place])    NPDR OU   GERD (gastroesophageal reflux disease)    Glaucoma    POAG OU   History of gout    History of hiatal hernia    History of kidney stones    Hyperlipidemia ~2005   Hypertension    Hypertensive retinopathy    OU   Refusal of blood transfusions as patient is Jehovah's Witness    Stroke [facility]) 04/2017   "dr told me I had a stroke in my left eye" (10/20/2018)   Type II diabetes mellitus ([place])    Past Surgical History:  Procedure Laterality Date   CATARACT EXTRACTION Bilateral    [name]   CATARACT EXTRACTION, BILATERAL Bilateral 2018   CHOLECYSTECTOMY     pt does not recall this OR (10/20/2018)   CYSTOSCOPY W/ STONE MANIPULATION     EXCISIONAL HEMORRHOIDECTOMY  1980s/1990s   "& he cut my muscle that controls my bowels"   EYE SURGERY     FRACTURE SURGERY     GLAUCOMA SURGERY Bilateral 2018   HERNIA REPAIR     KNEE ARTHROSCOPY Left 04/2012    WITH MEDIAL MENISECTOMY/notes 04/16/2012   LEFT HEART CATH AND CORONARY ANGIOGRAPHY N/A 10/21/2018   Procedure: LEFT HEART CATH AND CORONARY ANGIOGRAPHY;  Surgeon: Dixie Dials, MD;  Location: [facility];  Service: Cardiovascular;  Laterality: N/A;   PATELLA FRACTURE SURGERY Right ~  4097   UMBILICAL HERNIA REPAIR  2000s     A IV Location/Drains/Wounds Patient Lines/Drains/Airways Status     Active Line/Drains/Airways     Name Placement date Placement time Site Days   Peripheral IV 12/20/21 18 G Left Antecubital 12/20/21  1123  Antecubital  1   Peripheral IV 12/20/21 20 G Left;Posterior Wrist 12/20/21  --  Wrist  1            Intake/Output Last 24 hours No intake or output data in the 24 hours ending 12/21/21 1237  Labs/Imaging Results for orders placed or performed during the hospital encounter of 12/20/21 (from the past 48 hour(s))  Blood Culture (routine x 2)     Status: None (Preliminary result)   Collection Time: 12/20/21 11:31 AM   Specimen: BLOOD  Result Value Ref Range   Specimen  Description BLOOD BLOOD RIGHT HAND    Special Requests      BOTTLES DRAWN AEROBIC AND ANAEROBIC Blood Culture results may not be optimal due to an excessive volume of blood received in culture bottles   Culture      NO GROWTH < 24 HOURS Performed at [facility] [address], [place], [place] [address]    Report Status PENDING   Resp Panel by RT-PCR (Flu A&B, Covid) Nasopharyngeal Swab     Status: None   Collection Time: 12/20/21 11:34 AM   Specimen: Nasopharyngeal Swab; Nasopharyngeal(NP) swabs in vial transport medium  Result Value Ref Range   SARS Coronavirus 2 by RT PCR NEGATIVE NEGATIVE    Comment: (NOTE) SARS-CoV-2 target nucleic acids are NOT DETECTED.  The SARS-CoV-2 RNA is generally detectable in upper respiratory specimens during the acute phase of infection. The lowest concentration of SARS-CoV-2 viral copies this assay can detect is 138 copies/mL. A negative result does not preclude SARS-Cov-2 infection and should not be used as the sole basis for treatment or other patient management decisions. A negative result may occur with  improper specimen collection/handling, submission of specimen other than nasopharyngeal swab, presence of viral mutation(s) within the areas targeted by this assay, and inadequate number of viral copies(<138 copies/mL). A negative result must be combined with clinical observations, patient history, and epidemiological information. The expected result is Negative.  Fact Sheet for Patients:  EntrepreneurPulse.com.au  Fact Sheet for Healthcare Providers:  IncredibleEmployment.be  This test is no t yet approved or cleared by the [place] FDA and  has been authorized for detection and/or diagnosis of SARS-CoV-2 by FDA under an Emergency Use Authorization (EUA). This EUA will remain  in effect (meaning this test can be used) for the duration of the COVID-19 declaration under Section 564(b)(1) of the  Act, 21 U.S.C.section 360bbb-3(b)(1), unless the authorization is terminated  or revoked sooner.       Influenza A by PCR NEGATIVE NEGATIVE   Influenza B by PCR NEGATIVE NEGATIVE    Comment: (NOTE) The Xpert Xpress SARS-CoV-2/FLU/RSV plus assay is intended as an aid in the diagnosis of influenza from Nasopharyngeal swab specimens and should not be used as a sole basis for treatment. Nasal washings and aspirates are unacceptable for Xpert Xpress SARS-CoV-2/FLU/RSV testing.  Fact Sheet for Patients: EntrepreneurPulse.com.au  Fact Sheet for Healthcare Providers: IncredibleEmployment.be  This test is not yet approved or cleared by the [place] FDA and has been authorized for detection and/or diagnosis of SARS-CoV-2 by FDA under an Emergency Use Authorization (EUA). This EUA will remain in effect (meaning this test can be used)  for the duration of the COVID-19 declaration under Section 564(b)(1) of the Act, 21 U.S.C. section 360bbb-3(b)(1), unless the authorization is terminated or revoked.  Performed at [facility], [place] [address] [place], [place] [address]   Blood Culture (routine x 2)     Status: None (Preliminary result)   Collection Time: 12/20/21 11:36 AM   Specimen: BLOOD  Result Value Ref Range   Specimen Description BLOOD BLOOD RIGHT FOREARM    Special Requests      BOTTLES DRAWN AEROBIC AND ANAEROBIC Blood Culture results may not be optimal due to an excessive volume of blood received in culture bottles   Culture      NO GROWTH < 24 HOURS Performed at [facility] [address] [place], [place] [address]    Report Status PENDING   Lactic acid, plasma     Status: Abnormal   Collection Time: 12/20/21 11:38 AM  Result Value Ref Range   Lactic Acid, Venous 2.0 (HH) 0.5 - 1.9 mmol/L    Comment: CRITICAL RESULT CALLED TO, READ BACK BY AND VERIFIED WITH: W.Kerri Perches, RN 12/20/2021 1253 DAVISB Performed at [facility] [address], [place], [place] [address]   Comprehensive metabolic panel     Status: Abnormal   Collection Time: 12/20/21 11:38 AM  Result Value Ref Range   Sodium 136 135 - 145 mmol/L    Comment: 44   Potassium 2.6 (LL) 3.5 - 5.1 mmol/L    Comment: CRITICAL RESULT CALLED TO, READ BACK BY AND VERIFIED WITH: J.WHITLEY,RN 12/20/2021 1343 DAVISB    Chloride 103 98 - 111 mmol/L   CO2 23 22 - 32 mmol/L   Glucose, Bld 222 (H) 70 - 99 mg/dL    Comment: Glucose reference range applies only to samples taken after fasting for at least 8 hours.   BUN 10 8 - 23 mg/dL   Creatinine, Ser 0.94 0.44 - 1.00 mg/dL   Calcium 8.3 (L) 8.9 - 10.3 mg/dL   Total Protein 6.5 6.5 - 8.1 g/dL   Albumin 2.7 (L) 3.5 - 5.0 g/dL   AST 72 (H) 15 - 41 U/L   ALT 28 0 - 44 U/L   Alkaline Phosphatase 57 38 - 126 U/L   Total Bilirubin 1.2 0.3 - 1.2 mg/dL   GFR, Estimated 59 (L) >60 mL/min    Comment: (NOTE) Calculated using the CKD-EPI Creatinine Equation (2021)    Anion gap 10 5 - 15    Comment: Performed at [facility], [place] [address], [place], [place] [address]  CBC WITH DIFFERENTIAL     Status: Abnormal   Collection Time: 12/20/21 11:38 AM  Result Value Ref Range   WBC 11.9 (H) 4.0 - 10.5 K/uL   RBC 4.13 3.87 - 5.11 MIL/uL   Hemoglobin 12.8 12.0 - 15.0 g/dL   HCT 37.9 36.0 - 46.0 %   MCV 91.8 80.0 - 100.0 fL   MCH 31.0 26.0 - 34.0 pg   MCHC 33.8 30.0 - 36.0 g/dL   RDW 13.1 11.5 - 15.5 %   Platelets 209 150 - 400 K/uL   nRBC 0.0 0.0 - 0.2 %   Neutrophils Relative % 85 %   Neutro Abs 10.1 (H) 1.7 - 7.7 K/uL   Lymphocytes Relative 9 %   Lymphs Abs 1.0 0.7 - 4.0 K/uL   Monocytes Relative 6 %   Monocytes Absolute 0.7 0.1 - 1.0 K/uL   Eosinophils Relative 0 %  Eosinophils Absolute 0.0 0.0 - 0.5 K/uL   Basophils Relative 0 %   Basophils Absolute 0.0 0.0 - 0.1 K/uL   Immature Granulocytes 0 %   Abs Immature Granulocytes 0.05 0.00 - 0.07 K/uL    Comment: Performed at [facility] [address], [place], [place] [address]  Protime-INR     Status: None   Collection Time: 12/20/21 11:38 AM  Result Value Ref Range   Prothrombin Time 14.1 11.4 - 15.2 seconds   INR 1.1 0.8 - 1.2    Comment: (NOTE) INR goal varies based on device and disease states. Performed at [facility], [place] [address], [place], [place] [address]   APTT     Status: None   Collection Time: 12/20/21 11:38 AM  Result Value Ref Range   aPTT 29 24 - 36 seconds    Comment: Performed at [facility] [address], [place], [place] [address]  CK     Status: Abnormal   Collection Time: 12/20/21 11:38 AM  Result Value Ref Range   Total CK 3,327 (H) 38 - 234 U/L    Comment: Performed at [facility], [place] [address] [place], [place] [address]  Lactic acid, plasma     Status: None   Collection Time: 12/20/21  2:33 PM  Result Value Ref Range   Lactic Acid, Venous 1.8 0.5 - 1.9 mmol/L    Comment: Performed at [facility] [address] [place], [place] [address]  Magnesium     Status: None   Collection Time: 12/20/21  2:33 PM  Result Value Ref Range   Magnesium 1.9 1.7 - 2.4 mg/dL    Comment: Performed at [facility], [place] [address], [place],  [address]  Urinalysis, Routine w reflex microscopic     Status: Abnormal   Collection Time: 12/20/21  2:42 PM  Result Value Ref Range   Color, Urine YELLOW YELLOW   APPearance HAZY (A) CLEAR   Specific Gravity, Urine 1.013 1.005 - 1.030   pH 5.0 5.0 - 8.0   Glucose, UA NEGATIVE NEGATIVE mg/dL   Hgb urine dipstick LARGE (A) NEGATIVE   Bilirubin Urine NEGATIVE NEGATIVE   Ketones, ur NEGATIVE NEGATIVE mg/dL   Protein, ur 100 (A) NEGATIVE mg/dL   Nitrite NEGATIVE NEGATIVE   Leukocytes,Ua TRACE (A) NEGATIVE   RBC / HPF 0-5 0 - 5 RBC/hpf   WBC, UA 6-10 0 - 5 WBC/hpf   Bacteria, UA RARE (A) NONE SEEN    Comment: Performed at [facility] [address], [place],  [address]  Basic metabolic panel      Status: Abnormal   Collection Time: 12/21/21  3:30 AM  Result Value Ref Range   Sodium 140 135 - 145 mmol/L   Potassium 3.2 (L) 3.5 - 5.1 mmol/L    Comment: DELTA CHECK NOTED   Chloride 106 98 - 111 mmol/L   CO2 24 22 - 32 mmol/L   Glucose, Bld 114 (H) 70 - 99 mg/dL    Comment: Glucose reference range applies only to samples taken after fasting for at least 8 hours.   BUN 9 8 - 23 mg/dL   Creatinine, Ser 0.82 0.44 - 1.00 mg/dL   Calcium 8.1 (L) 8.9 - 10.3 mg/dL   GFR, Estimated >60 >60 mL/min    Comment: (NOTE) Calculated using the CKD-EPI Creatinine Equation (2021)    Anion gap 10 5 - 15  Comment: Performed at [facility], [place] [address], [place], [place] [address]  CBC     Status: Abnormal   Collection Time: 12/21/21  3:30 AM  Result Value Ref Range   WBC 11.8 (H) 4.0 - 10.5 K/uL   RBC 3.80 (L) 3.87 - 5.11 MIL/uL   Hemoglobin 11.9 (L) 12.0 - 15.0 g/dL   HCT 35.5 (L) 36.0 - 46.0 %   MCV 93.4 80.0 - 100.0 fL   MCH 31.3 26.0 - 34.0 pg   MCHC 33.5 30.0 - 36.0 g/dL   RDW 13.2 11.5 - 15.5 %   Platelets 183 150 - 400 K/uL   nRBC 0.0 0.0 - 0.2 %    Comment: Performed at [facility], [place] [address] [place], [place] [address]  Lipid panel     Status: None   Collection Time: 12/21/21  3:30 AM  Result Value Ref Range   Cholesterol 148 0 - 200 mg/dL   Triglycerides 49 <150 mg/dL   HDL 52 >40 mg/dL   Total CHOL/HDL Ratio 2.8 RATIO   VLDL 10 0 - 40 mg/dL   LDL Cholesterol 86 0 - 99 mg/dL    Comment:        Total Cholesterol/HDL:CHD Risk Coronary Heart Disease Risk Table                     Men   Women  1/2 Average Risk   3.4   3.3  Average Risk       5.0   4.4  2 X Average Risk   9.6   7.1  3 X Average Risk  23.4   11.0        Use the calculated Patient Ratio above and the CHD Risk Table to determine the patient's CHD Risk.        ATP III CLASSIFICATION (LDL):  <100     mg/dL   Optimal  100-129  mg/dL   Near or Above                    Optimal  130-159   mg/dL   Borderline  160-189  mg/dL   High  >190     mg/dL   Very High Performed at [facility] [address], [place], [place] [address]   CK     Status: Abnormal   Collection Time: 12/21/21  3:30 AM  Result Value Ref Range   Total CK 2,301 (H) 38 - 234 U/L    Comment: Performed at [facility], [place] [address], [place], [place] [address]  Hemoglobin A1c     Status: Abnormal   Collection Time: 12/21/21  3:30 AM  Result Value Ref Range   Hgb A1c MFr Bld 6.2 (H) 4.8 - 5.6 %    Comment: (NOTE) Pre diabetes:          5.7%-6.4%  Diabetes:              >6.4%  Glycemic control for   <7.0% adults with diabetes    Mean Plasma Glucose 131.24 mg/dL    Comment: Performed at [facility] [address], [place], [place] [address]  CBG monitoring, ED     Status: None   Collection Time: 12/21/21  7:56 AM  Result Value Ref Range   Glucose-Capillary 72 70 - 99 mg/dL    Comment: Glucose reference range applies only to samples taken after fasting for at  least 8 hours.  CBG monitoring, ED     Status: None   Collection Time: 12/21/21 11:52 AM  Result Value Ref Range   Glucose-Capillary 98 70 - 99 mg/dL    Comment: Glucose reference range applies only to samples taken after fasting for at least 8 hours.   DG Chest 1 View  Result Date: 12/20/2021 CLINICAL DATA:  Weakness, possible fall EXAM: CHEST  1 VIEW COMPARISON:  Chest radiograph 10/20/2018 FINDINGS: The heart is at the upper limits of normal for size. The upper mediastinal contours are normal. There is no focal consolidation or pulmonary edema. There is no pleural effusion or pneumothorax There is no acute osseous abnormality. IMPRESSION: No radiographic evidence of acute cardiopulmonary process. Electronically Signed   By: Valetta Mole M.D.   On: 12/20/2021 12:18   DG Lumbar Spine 2-3 Views  Result Date: 12/20/2021 CLINICAL DATA:  Possible fall EXAM: LUMBAR SPINE - 2-3 VIEW COMPARISON:  None. FINDINGS: Normal alignment.  No fracture. Degenerative disc and facet disease. SI joints symmetric and unremarkable. IMPRESSION: No acute bony abnormality. Electronically Signed   By: Rolm Baptise M.D.   On: 12/20/2021 20:01   DG Ankle 2 Views Left  Result Date: 12/20/2021 CLINICAL DATA:  Possible fall EXAM: LEFT ANKLE - 2 VIEW COMPARISON:  None. FINDINGS: Plantar and posterior calcaneal spurs. No acute bony abnormality. Specifically, no fracture, subluxation, or dislocation. IMPRESSION: No acute bony abnormality. Electronically Signed   By: Rolm Baptise M.D.   On: 12/20/2021 20:02   CT Head Wo Contrast  Result Date: 12/20/2021 CLINICAL DATA:  Acute neuro deficit with weakness. Suspected stroke. EXAM: CT HEAD WITHOUT CONTRAST TECHNIQUE: Contiguous axial images were obtained from the base of the skull through the vertex without intravenous contrast. RADIATION DOSE REDUCTION: This exam was performed according to the departmental dose-optimization program which includes automated exposure control, adjustment of the mA and/or kV according to patient size and/or use of iterative reconstruction technique. COMPARISON:  None. FINDINGS: Brain: Mildly enlarged ventricles and cortical sulci. Mild patchy white matter low density in both cerebral hemispheres. Oval, densely calcified mass at the right vertex measuring 1.6 x 1.2 cm on sagittal image number 26/6. No intracranial hemorrhage or CT findings suspicious for acute infarction. Vascular: No hyperdense vessel or unexpected calcification. Skull: Normal. Negative for fracture or focal lesion. Sinuses/Orbits: Completely opacified left frontal sinus. Extensive mucosal thickening with air cell opacification in the left ethmoid sinus. Mucosal thickening in the inferior right frontal sinus and anterior right ethmoid sinus. Small bilateral maxillary sinus retention cysts. Status post bilateral cataract extraction. Other: None. IMPRESSION: 1. No acute abnormality. 2. 1.6 cm densely calcified meningioma  at the right vertex with no associated edema. 3. Mild diffuse cerebral and cerebellar atrophy. 4. Mild chronic white matter ischemic changes in both cerebral hemispheres, slightly more pronounced and confluent in the left frontal lobe. 5. Chronic sinusitis. Electronically Signed   By: Claudie Revering M.D.   On: 12/20/2021 12:30   CT Cervical Spine Wo Contrast  Result Date: 12/21/2021 CLINICAL DATA:  Fall. EXAM: CT CERVICAL SPINE WITHOUT CONTRAST TECHNIQUE: Multidetector CT imaging of the cervical spine was performed without intravenous contrast. Multiplanar CT image reconstructions were also generated. RADIATION DOSE REDUCTION: This exam was performed according to the departmental dose-optimization program which includes automated exposure control, adjustment of the mA and/or kV according to patient size and/or use of iterative reconstruction technique. COMPARISON:  None. FINDINGS: Alignment: No acute subluxation. Skull base and vertebrae: No acute fracture. Soft  tissues and spinal canal: No prevertebral fluid or swelling. No visible canal hematoma. Disc levels: Degenerative changes with disc space narrowing and endplate irregularity primarily at C6-C7. multilevel facet arthropathy. Upper chest: Negative. Other: None IMPRESSION: 1. No acute fracture or subluxation of the cervical spine. 2. Degenerative changes. Electronically Signed   By: Anner Crete M.D.   On: 12/21/2021 02:30   MR BRAIN WO CONTRAST  Addendum Date: 12/21/2021   ADDENDUM REPORT: 12/21/2021 08:51 ADDENDUM: These results were called by telephone at the time of interpretation on 12/21/2021 at 8:51 am to provider Dr. Linus Salmons of the emergency department, who verbally acknowledged these results. Electronically Signed   By: Kellie Simmering D.O.   On: 12/21/2021 08:51   Result Date: 12/21/2021 CLINICAL DATA:  Provided history: Fall. Additional history provided: Fall with weakness. History of hypertension, PAF, DM2, HLD and gout. Additional history  obtained from [place] febrile and with mild leukocytosis. EXAM: MRI HEAD WITHOUT CONTRAST TECHNIQUE: Multiplanar, multiecho pulse sequences of the brain and surrounding structures were obtained without intravenous contrast. COMPARISON:  Head CT 12/20/2021. FINDINGS: Brain: Mild intermittent motion degradation. Mild generalized cerebral and cerebellar atrophy. There is extra-axial T2 FLAIR hyperintense signal abnormality and corresponding restricted diffusion overlying the anterior left frontal lobe, measuring up to 7 mm in thickness (for instance as seen on series 11, image 18) (series 5, image 94). This signal abnormality appears contiguous with extensive signal abnormality opacifying the left frontal sinus. Additionally, irregular lucency of the inner table of the left frontal sinus is noted on yesterday's head CT (for instance as seen on series 4, image 38 of this prior exam). This is highly suspicious for subdural empyema resulting from severe left frontal sinusitis. Redemonstrated 1.6 x 1.3 cm densely calcified lobular extra-axial focus overlying posterior right frontal lobe, likely reflecting a calcified meningioma. There is mild local mass effect upon the underlying posterior right frontal lobe without appreciable underlying parenchymal edema. Mild for age multifocal T2 FLAIR hyperintense signal abnormality within the cerebral white matter, nonspecific but compatible with chronic small vessel ischemic disease. There is no acute infarct. No chronic intracranial blood products. No midline shift. Vascular: Maintained flow voids within the proximal large arterial vessels. Skull and upper cervical spine: Irregular lucency/erosion of the inner table of the left frontal sinus better appreciated on same-day head CT. Incompletely assessed cervical spondylosis. Sinuses/Orbits: Visualized orbits show no acute finding. Complete T2 hyperintense opacification of the left frontal sinus. Extensive  T2 hyperintense opacification of the anterior left ethmoid air cells. Mild mucosal thickening elsewhere within the bilateral ethmoid sinuses. Mild mucosal thickening within the bilateral maxillary sinuses. Other: Trace fluid within the left mastoid air cells. IMPRESSION: Extra-axial T2 FLAIR hyperintense signal abnormality and corresponding restricted diffusion overlying the anterior left frontal lobe, measuring up to 7 mm in thickness. This signal abnormality appears contiguous with severe left frontal sinus disease. Additionally, irregular lucency/erosion of the inner table of the left frontal sinus is noted on yesterday's head CT. These findings are highly suspicious for subdural empyema resulting from severe left frontal sinusitis. Neurosurgical consultation is recommended. 1.6 x 1.3 cm densely calcified meningioma overlying the posterior right frontal lobe. Local mass effect upon the underlying right frontal lobe without underlying parenchymal edema. Mild chronic small vessel ischemic changes within the cerebral white matter. Mild generalized parenchymal atrophy. Additional paranasal sinus disease, as described, some of which has a left ostiomeatal unit obstructive pattern. Electronically Signed: By: Kellie Simmering D.O. On: 12/21/2021 08:44  DG Shoulder Left  Result Date: 12/20/2021 CLINICAL DATA:  Possible fall. EXAM: LEFT SHOULDER - 2+ VIEW COMPARISON:  None. FINDINGS: Degenerative changes in the left shoulder, most pronounced in the [facility] joint with joint space narrowing and spurring. No acute bony abnormality. Specifically, no fracture, subluxation, or dislocation. IMPRESSION: No acute bony abnormality. Electronically Signed   By: Rolm Baptise M.D.   On: 12/20/2021 20:00   DG Knee Complete 4 Views Left  Result Date: 12/20/2021 CLINICAL DATA:  Fall. EXAM: LEFT KNEE - COMPLETE 4+ VIEW COMPARISON:  None. FINDINGS: No evidence of fracture, dislocation, or joint effusion. Mild narrowing of medial joint space  is noted. Soft tissues are unremarkable. IMPRESSION: Mild degenerative joint disease is noted medially. No acute abnormality seen. Electronically Signed   By: Marijo Conception M.D.   On: 12/20/2021 12:16   DG Foot 2 Views Left  Result Date: 12/20/2021 CLINICAL DATA:  Possible fall. EXAM: LEFT FOOT - 2 VIEW COMPARISON:  None. FINDINGS: No acute bony abnormality. Specifically, no fracture, subluxation, or dislocation. Plantar calcaneal spur. Mild degenerative changes at the 1st MTP joint. IMPRESSION: No acute bony abnormality. Electronically Signed   By: Rolm Baptise M.D.   On: 12/20/2021 20:01   DG Hip Unilat W or Wo Pelvis 2-3 Views Left  Result Date: 12/20/2021 CLINICAL DATA:  Fall. EXAM: DG HIP (WITH OR WITHOUT PELVIS) 2-3V LEFT COMPARISON:  None. FINDINGS: There is no evidence of hip fracture or dislocation. Mild osteophyte formation is seen involving the left hip. 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 No acute abnormality is noted. Electronically Signed   By: Marijo Conception M.D.   On: 12/20/2021 12:17    Pending Labs Unresulted Labs (From admission, onward)     Start     Ordered   12/22/21 0500  CK  Tomorrow morning,   R        12/21/21 1153   12/22/21 0500  CBC with Differential/Platelet  Tomorrow morning,   R        12/21/21 1153   12/21/21 5397  Basic metabolic panel  Once,   R        12/21/21 0931   12/21/21 1030  HIV Antibody (routine testing w rflx)  (HIV Antibody (Routine testing w reflex) panel)  Once,   R        12/21/21 1029   12/20/21 1131  Urine Culture  (Undifferentiated presentation (screening labs and basic nursing orders))  ONCE - STAT,   STAT       Question:  Indication  Answer:  Sepsis   12/20/21 1131            Vitals/Pain Today's Vitals   12/21/21 0500 12/21/21 0600 12/21/21 0800 12/21/21 1100  BP: (!) 92/57 (!) 102/52 129/75 (!) 107/56  Pulse: 65 65 81 76  Resp: 18 18 18 17   Temp:   98.4 F (36.9 C)   TempSrc:   Oral   SpO2: 96%  97% 97% 98%  Weight:      Height:      PainSc:        Isolation Precautions No active isolations  Medications Medications  vancomycin (VANCOREADY) IVPB 750 mg/150 mL (0 mg Intravenous Stopped 12/21/21 1203)  ceFEPIme (MAXIPIME) 2 g in sodium chloride 0.9 % 100 mL IVPB (0 g Intravenous Stopped 12/21/21 0209)  apixaban (ELIQUIS) tablet 5 mg (5 mg Oral Given 12/21/21 0934)  0.9 %  sodium chloride infusion ( Intravenous New Bag/Given  12/21/21 0757)  insulin aspart (novoLOG) injection 0-9 Units (0 Units Subcutaneous Not Given 12/21/21 1203)  thiamine tablet 100 mg (100 mg Oral Given 12/21/21 0935)    Or  thiamine (B-1) injection 100 mg ( Intravenous See Alternative 0/25/42 7062)  folic acid (FOLVITE) tablet 1 mg (1 mg Oral Given 12/21/21 0934)  multivitamin with minerals tablet 1 tablet (1 tablet Oral Given 12/21/21 0934)  acetaminophen (TYLENOL) tablet 650 mg (650 mg Oral Given 12/21/21 0145)  potassium chloride SA (KLOR-CON M) CR tablet 40 mEq (40 mEq Oral Given 12/21/21 0934)  lactated ringers bolus 1,000 mL (0 mLs Intravenous Stopped 12/20/21 1402)  ceFEPIme (MAXIPIME) 2 g in sodium chloride 0.9 % 100 mL IVPB (0 g Intravenous Stopped 12/20/21 1401)  metroNIDAZOLE (FLAGYL) IVPB 500 mg (0 mg Intravenous Stopped 12/20/21 1451)  vancomycin (VANCOREADY) IVPB 1500 mg/300 mL (0 mg Intravenous Stopped 12/20/21 1657)  lactated ringers bolus 1,000 mL (0 mLs Intravenous Stopped 12/20/21 1657)  potassium chloride SA (KLOR-CON M) CR tablet 40 mEq (40 mEq Oral Given 12/20/21 1453)  potassium chloride 10 mEq in 100 mL IVPB (0 mEq Intravenous Stopped 12/20/21 1657)  albuterol (PROVENTIL) (2.5 MG/3ML) 0.083% nebulizer solution 2.5 mg (2.5 mg Nebulization Given 12/21/21 0934)    Mobility walks with person assist Low fall risk   Focused Assessments Neuro Assessment Handoff:  Swallow screen pass? Yes  Cardiac Rhythm: Normal sinus rhythm       Neuro Assessment: Within Defined Limits Neuro Checks:      Last  Documented NIHSS Modified Score:   Has TPA been given? No If patient is a Neuro Trauma and patient is going to OR before floor call report to [place] nurse: [phone] or [phone]   R Recommendations: See Admitting Provider Note  Report given to:   Additional Notes: none

## 2021-12-22 DIAGNOSIS — A419 Sepsis, unspecified organism: Secondary | ICD-10-CM | POA: Diagnosis not present

## 2021-12-22 LAB — CBC WITH DIFFERENTIAL/PLATELET
Abs Immature Granulocytes: 0.05 10*3/uL (ref 0.00–0.07)
Basophils Absolute: 0 10*3/uL (ref 0.0–0.1)
Basophils Relative: 0 %
Eosinophils Absolute: 0.1 10*3/uL (ref 0.0–0.5)
Eosinophils Relative: 1 %
HCT: 31.1 % — ABNORMAL LOW (ref 36.0–46.0)
Hemoglobin: 10.6 g/dL — ABNORMAL LOW (ref 12.0–15.0)
Immature Granulocytes: 0 %
Lymphocytes Relative: 15 %
Lymphs Abs: 1.8 10*3/uL (ref 0.7–4.0)
MCH: 31.4 pg (ref 26.0–34.0)
MCHC: 34.1 g/dL (ref 30.0–36.0)
MCV: 92 fL (ref 80.0–100.0)
Monocytes Absolute: 1 10*3/uL (ref 0.1–1.0)
Monocytes Relative: 8 %
Neutro Abs: 9.1 10*3/uL — ABNORMAL HIGH (ref 1.7–7.7)
Neutrophils Relative %: 76 %
Platelets: 168 10*3/uL (ref 150–400)
RBC: 3.38 MIL/uL — ABNORMAL LOW (ref 3.87–5.11)
RDW: 13.1 % (ref 11.5–15.5)
WBC: 12.1 10*3/uL — ABNORMAL HIGH (ref 4.0–10.5)
nRBC: 0 % (ref 0.0–0.2)

## 2021-12-22 LAB — BASIC METABOLIC PANEL
Anion gap: 5 (ref 5–15)
BUN: 9 mg/dL (ref 8–23)
CO2: 23 mmol/L (ref 22–32)
Calcium: 7.7 mg/dL — ABNORMAL LOW (ref 8.9–10.3)
Chloride: 108 mmol/L (ref 98–111)
Creatinine, Ser: 0.73 mg/dL (ref 0.44–1.00)
GFR, Estimated: >60 mL/min (ref >60)
Glucose, Bld: 155 mg/dL — ABNORMAL HIGH (ref 70–99)
Potassium: 3.7 mmol/L (ref 3.5–5.1)
Sodium: 136 mmol/L (ref 135–145)

## 2021-12-22 LAB — URINE CULTURE

## 2021-12-22 LAB — CK: Total CK: 1111 U/L — ABNORMAL HIGH (ref 38–234)

## 2021-12-22 LAB — GLUCOSE, CAPILLARY
Glucose-Capillary: 131 mg/dL — ABNORMAL HIGH (ref 70–99)
Glucose-Capillary: 161 mg/dL — ABNORMAL HIGH (ref 70–99)
Glucose-Capillary: 121 mg/dL — ABNORMAL HIGH (ref 70–99)
Glucose-Capillary: 136 mg/dL — ABNORMAL HIGH (ref 70–99)

## 2021-12-22 LAB — ALBUMIN: Albumin: 2.1 g/dL — ABNORMAL LOW (ref 3.5–5.0)

## 2021-12-22 NOTE — Consult Note (Signed)
Reason for Consult:sinusitis Referring Physician: FP  Chelsea Gallegos is an 88 y.o. female.  HPI: hx of sepsis arriving to ER 1/12. She has a left frontal sinusitis with erosion of the posterior table and empyema of the subdural space. She has not has sinusitis hx. No headaches. No previous sinus surgery. Hx of cataract surgery bilaterally and chronic ptosis of the left eye after eye surgery 2 years ago. She denies nasal drainage.   Past Medical History:  Diagnosis Date   Arthritis    "arms" (10/20/2018)   Asthma    Diabetic retinopathy (HCC)    NPDR OU   GERD (gastroesophageal reflux disease)    Glaucoma    POAG OU   History of gout    History of hiatal hernia    History of kidney stones    Hyperlipidemia ~2005   Hypertension    Hypertensive retinopathy    OU   Refusal of blood transfusions as patient is Jehovah's Witness    Stroke [facility]) 04/2017   "dr told me I had a stroke in my left eye" (10/20/2018)   Type II diabetes mellitus ([place])     Past Surgical History:  Procedure Laterality Date   CATARACT EXTRACTION Bilateral    [name]   CATARACT EXTRACTION, BILATERAL Bilateral 2018   CHOLECYSTECTOMY     pt does not recall this OR (10/20/2018)   CYSTOSCOPY W/ STONE MANIPULATION     EXCISIONAL HEMORRHOIDECTOMY  1980s/1990s   "& he cut my muscle that controls my bowels"   EYE SURGERY     FRACTURE SURGERY     GLAUCOMA SURGERY Bilateral 2018   HERNIA REPAIR     KNEE ARTHROSCOPY Left 04/2012    WITH MEDIAL MENISECTOMY/notes 04/16/2012   LEFT HEART CATH AND CORONARY ANGIOGRAPHY N/A 10/21/2018   Procedure: LEFT HEART CATH AND CORONARY ANGIOGRAPHY;  Surgeon: Dixie Dials, MD;  Location: [facility];  Service: Cardiovascular;  Laterality: N/A;   PATELLA FRACTURE SURGERY Right ~ 1660   UMBILICAL HERNIA REPAIR  2000s    Family History  Problem Relation Age of Onset   Hypertension Mother    Hypertension Father    Breast cancer Daughter     Social History:  reports  that she quit smoking about 49 years ago. Her smoking use included cigarettes. She has a 60.00 pack-year smoking history. She has never used smokeless tobacco. She reports current alcohol use of about 1.0 standard drink per week. She reports that she does not use drugs.  Allergies:  Allergies  Allergen Reactions   Netarsudil Other (See Comments)    Other reaction(s): Other (See Comments) Red with like blood running out of her eyes Red with like blood running out of her eyes    Tafluprost (Pf) Other (See Comments)    Other reaction(s): Other (See Comments) Red with like blood running out of her eyes Red with like blood running out of her eyes     Medications: I have reviewed the patient's current medications.  Results for orders placed or performed during the hospital encounter of 12/20/21 (from the past 48 hour(s))  Basic metabolic panel     Status: Abnormal   Collection Time: 12/21/21  3:30 AM  Result Value Ref Range   Sodium 140 135 - 145 mmol/L   Potassium 3.2 (L) 3.5 - 5.1 mmol/L    Comment: DELTA CHECK NOTED   Chloride 106 98 - 111 mmol/L   CO2 24 22 - 32 mmol/L   Glucose,  Bld 114 (H) 70 - 99 mg/dL    Comment: Glucose reference range applies only to samples taken after fasting for at least 8 hours.   BUN 9 8 - 23 mg/dL   Creatinine, Ser 0.82 0.44 - 1.00 mg/dL   Calcium 8.1 (L) 8.9 - 10.3 mg/dL   GFR, Estimated >60 >60 mL/min    Comment: (NOTE) Calculated using the CKD-EPI Creatinine Equation (2021)    Anion gap 10 5 - 15    Comment: Performed at [facility] [address] [place], [place] [address]  CBC     Status: Abnormal   Collection Time: 12/21/21  3:30 AM  Result Value Ref Range   WBC 11.8 (H) 4.0 - 10.5 K/uL   RBC 3.80 (L) 3.87 - 5.11 MIL/uL   Hemoglobin 11.9 (L) 12.0 - 15.0 g/dL   HCT 35.5 (L) 36.0 - 46.0 %   MCV 93.4 80.0 - 100.0 fL   MCH 31.3 26.0 - 34.0 pg   MCHC 33.5 30.0 - 36.0 g/dL   RDW 13.2 11.5 - 15.5 %   Platelets 183 150 - 400 K/uL    nRBC 0.0 0.0 - 0.2 %    Comment: Performed at [facility], [place] [address] [place], [place] [address]  Lipid panel     Status: None   Collection Time: 12/21/21  3:30 AM  Result Value Ref Range   Cholesterol 148 0 - 200 mg/dL   Triglycerides 49 <150 mg/dL   HDL 52 >40 mg/dL   Total CHOL/HDL Ratio 2.8 RATIO   VLDL 10 0 - 40 mg/dL   LDL Cholesterol 86 0 - 99 mg/dL    Comment:        Total Cholesterol/HDL:CHD Risk Coronary Heart Disease Risk Table                     Men   Women  1/2 Average Risk   3.4   3.3  Average Risk       5.0   4.4  2 X Average Risk   9.6   7.1  3 X Average Risk  23.4   11.0        Use the calculated Patient Ratio above and the CHD Risk Table to determine the patient's CHD Risk.        ATP III CLASSIFICATION (LDL):  <100     mg/dL   Optimal  100-129  mg/dL   Near or Above                    Optimal  130-159  mg/dL   Borderline  160-189  mg/dL   High  >190     mg/dL   Very High Performed at [facility] [address] [place], [place] [address]   CK     Status: Abnormal   Collection Time: 12/21/21  3:30 AM  Result Value Ref Range   Total CK 2,301 (H) 38 - 234 U/L    Comment: Performed at [facility], [place] [address], [place], [place] [address]  Hemoglobin A1c     Status: Abnormal   Collection Time: 12/21/21  3:30 AM  Result Value Ref Range   Hgb A1c MFr Bld 6.2 (H) 4.8 - 5.6 %    Comment: (NOTE) Pre diabetes:          5.7%-6.4%  Diabetes:              >  6.4%  Glycemic control for   <7.0% adults with diabetes    Mean Plasma Glucose 131.24 mg/dL    Comment: Performed at [facility] [address], [place], [place] [address]  CBG monitoring, ED     Status: None   Collection Time: 12/21/21  7:56 AM  Result Value Ref Range   Glucose-Capillary 72 70 - 99 mg/dL    Comment: Glucose reference range applies only to samples taken after fasting for at least 8 hours.  CBG monitoring, ED     Status: None   Collection Time:  12/21/21 11:52 AM  Result Value Ref Range   Glucose-Capillary 98 70 - 99 mg/dL    Comment: Glucose reference range applies only to samples taken after fasting for at least 8 hours.  Basic metabolic panel     Status: Abnormal   Collection Time: 12/21/21  2:59 PM  Result Value Ref Range   Sodium 136 135 - 145 mmol/L   Potassium 3.5 3.5 - 5.1 mmol/L   Chloride 106 98 - 111 mmol/L   CO2 21 (L) 22 - 32 mmol/L   Glucose, Bld 154 (H) 70 - 99 mg/dL    Comment: Glucose reference range applies only to samples taken after fasting for at least 8 hours.   BUN 10 8 - 23 mg/dL   Creatinine, Ser 0.80 0.44 - 1.00 mg/dL   Calcium 8.2 (L) 8.9 - 10.3 mg/dL   GFR, Estimated >60 >60 mL/min    Comment: (NOTE) Calculated using the CKD-EPI Creatinine Equation (2021)    Anion gap 9 5 - 15    Comment: Performed at [facility] [address] [place], [place] [address]  HIV Antibody (routine testing w rflx)     Status: None   Collection Time: 12/21/21  2:59 PM  Result Value Ref Range   HIV Screen 4th Generation wRfx Non Reactive Non Reactive    Comment: Performed at [facility], [place] [address] [place], [place] [address]  CK     Status: Abnormal   Collection Time: 12/22/21  2:19 AM  Result Value Ref Range   Total CK 1,111 (H) 38 - 234 U/L    Comment: Performed at [facility], [place] [address], [place], [place] [address]  CBC with Differential/Platelet     Status: Abnormal   Collection Time: 12/22/21  2:19 AM  Result Value Ref Range   WBC 12.1 (H) 4.0 - 10.5 K/uL   RBC 3.38 (L) 3.87 - 5.11 MIL/uL   Hemoglobin 10.6 (L) 12.0 - 15.0 g/dL   HCT 31.1 (L) 36.0 - 46.0 %   MCV 92.0 80.0 - 100.0 fL   MCH 31.4 26.0 - 34.0 pg   MCHC 34.1 30.0 - 36.0 g/dL   RDW 13.1 11.5 - 15.5 %   Platelets 168 150 - 400 K/uL   nRBC 0.0 0.0 - 0.2 %   Neutrophils Relative % 76 %   Neutro Abs 9.1 (H) 1.7 - 7.7 K/uL   Lymphocytes Relative 15 %   Lymphs Abs 1.8 0.7 - 4.0 K/uL   Monocytes Relative 8 %    Monocytes Absolute 1.0 0.1 - 1.0 K/uL   Eosinophils Relative 1 %   Eosinophils Absolute 0.1 0.0 - 0.5 K/uL   Basophils Relative 0 %   Basophils Absolute 0.0 0.0 - 0.1 K/uL   Immature Granulocytes 0 %   Abs Immature Granulocytes 0.05 0.00 - 0.07 K/uL    Comment: Performed  at [facility], [place] [address], [place], [place] [address]  Basic metabolic panel     Status: Abnormal   Collection Time: 12/22/21  2:19 AM  Result Value Ref Range   Sodium 136 135 - 145 mmol/L   Potassium 3.7 3.5 - 5.1 mmol/L   Chloride 108 98 - 111 mmol/L   CO2 23 22 - 32 mmol/L   Glucose, Bld 155 (H) 70 - 99 mg/dL    Comment: Glucose reference range applies only to samples taken after fasting for at least 8 hours.   BUN 9 8 - 23 mg/dL   Creatinine, Ser 0.73 0.44 - 1.00 mg/dL   Calcium 7.7 (L) 8.9 - 10.3 mg/dL   GFR, Estimated >60 >60 mL/min    Comment: (NOTE) Calculated using the CKD-EPI Creatinine Equation (2021)    Anion gap 5 5 - 15    Comment: Performed at [facility] [address], [place], [place] [address]  Albumin     Status: Abnormal   Collection Time: 12/22/21  6:31 AM  Result Value Ref Range   Albumin 2.1 (L) 3.5 - 5.0 g/dL    Comment: Performed at [facility], [place] [address], [place], [place] [address]  Glucose, capillary     Status: Abnormal   Collection Time: 12/22/21  7:02 AM  Result Value Ref Range   Glucose-Capillary 131 (H) 70 - 99 mg/dL    Comment: Glucose reference range applies only to samples taken after fasting for at least 8 hours.  Glucose, capillary     Status: Abnormal   Collection Time: 12/22/21 11:26 AM  Result Value Ref Range   Glucose-Capillary 161 (H) 70 - 99 mg/dL    Comment: Glucose reference range applies only to samples taken after fasting for at least 8 hours.    DG Lumbar Spine 2-3 Views  Result Date: 12/20/2021 CLINICAL DATA:  Possible fall EXAM: LUMBAR SPINE - 2-3 VIEW COMPARISON:  None. FINDINGS: Normal alignment. No fracture. Degenerative  disc and facet disease. SI joints symmetric and unremarkable. IMPRESSION: No acute bony abnormality. Electronically Signed   By: Rolm Baptise M.D.   On: 12/20/2021 20:01   DG Ankle 2 Views Left  Result Date: 12/20/2021 CLINICAL DATA:  Possible fall EXAM: LEFT ANKLE - 2 VIEW COMPARISON:  None. FINDINGS: Plantar and posterior calcaneal spurs. No acute bony abnormality. Specifically, no fracture, subluxation, or dislocation. IMPRESSION: No acute bony abnormality. Electronically Signed   By: Rolm Baptise M.D.   On: 12/20/2021 20:02   CT Cervical Spine Wo Contrast  Result Date: 12/21/2021 CLINICAL DATA:  Fall. EXAM: CT CERVICAL SPINE WITHOUT CONTRAST TECHNIQUE: Multidetector CT imaging of the cervical spine was performed without intravenous contrast. Multiplanar CT image reconstructions were also generated. RADIATION DOSE REDUCTION: This exam was performed according to the departmental dose-optimization program which includes automated exposure control, adjustment of the mA and/or kV according to patient size and/or use of iterative reconstruction technique. COMPARISON:  None. FINDINGS: Alignment: No acute subluxation. Skull base and vertebrae: No acute fracture. Soft tissues and spinal canal: No prevertebral fluid or swelling. No visible canal hematoma. Disc levels: Degenerative changes with disc space narrowing and endplate irregularity primarily at C6-C7. multilevel facet arthropathy. Upper chest: Negative. Other: None IMPRESSION: 1. No acute fracture or subluxation of the cervical spine. 2. Degenerative changes. Electronically Signed   By: Anner Crete M.D.   On: 12/21/2021 02:30   MR BRAIN WO CONTRAST  Addendum Date: 12/21/2021  ADDENDUM REPORT: 12/21/2021 08:51 ADDENDUM: These results were called by telephone at the time of interpretation on 12/21/2021 at 8:51 am to provider Dr. Linus Salmons of the emergency department, who verbally acknowledged these results. Electronically Signed   By: Kellie Simmering D.O.    On: 12/21/2021 08:51   Result Date: 12/21/2021 CLINICAL DATA:  Provided history: Fall. Additional history provided: Fall with weakness. History of hypertension, PAF, DM2, HLD and gout. Additional history obtained from [place] febrile and with mild leukocytosis. EXAM: MRI HEAD WITHOUT CONTRAST TECHNIQUE: Multiplanar, multiecho pulse sequences of the brain and surrounding structures were obtained without intravenous contrast. COMPARISON:  Head CT 12/20/2021. FINDINGS: Brain: Mild intermittent motion degradation. Mild generalized cerebral and cerebellar atrophy. There is extra-axial T2 FLAIR hyperintense signal abnormality and corresponding restricted diffusion overlying the anterior left frontal lobe, measuring up to 7 mm in thickness (for instance as seen on series 11, image 18) (series 5, image 94). This signal abnormality appears contiguous with extensive signal abnormality opacifying the left frontal sinus. Additionally, irregular lucency of the inner table of the left frontal sinus is noted on yesterday's head CT (for instance as seen on series 4, image 38 of this prior exam). This is highly suspicious for subdural empyema resulting from severe left frontal sinusitis. Redemonstrated 1.6 x 1.3 cm densely calcified lobular extra-axial focus overlying posterior right frontal lobe, likely reflecting a calcified meningioma. There is mild local mass effect upon the underlying posterior right frontal lobe without appreciable underlying parenchymal edema. Mild for age multifocal T2 FLAIR hyperintense signal abnormality within the cerebral white matter, nonspecific but compatible with chronic small vessel ischemic disease. There is no acute infarct. No chronic intracranial blood products. No midline shift. Vascular: Maintained flow voids within the proximal large arterial vessels. Skull and upper cervical spine: Irregular lucency/erosion of the inner table of the left frontal sinus better  appreciated on same-day head CT. Incompletely assessed cervical spondylosis. Sinuses/Orbits: Visualized orbits show no acute finding. Complete T2 hyperintense opacification of the left frontal sinus. Extensive T2 hyperintense opacification of the anterior left ethmoid air cells. Mild mucosal thickening elsewhere within the bilateral ethmoid sinuses. Mild mucosal thickening within the bilateral maxillary sinuses. Other: Trace fluid within the left mastoid air cells. IMPRESSION: Extra-axial T2 FLAIR hyperintense signal abnormality and corresponding restricted diffusion overlying the anterior left frontal lobe, measuring up to 7 mm in thickness. This signal abnormality appears contiguous with severe left frontal sinus disease. Additionally, irregular lucency/erosion of the inner table of the left frontal sinus is noted on yesterday's head CT. These findings are highly suspicious for subdural empyema resulting from severe left frontal sinusitis. Neurosurgical consultation is recommended. 1.6 x 1.3 cm densely calcified meningioma overlying the posterior right frontal lobe. Local mass effect upon the underlying right frontal lobe without underlying parenchymal edema. Mild chronic small vessel ischemic changes within the cerebral white matter. Mild generalized parenchymal atrophy. Additional paranasal sinus disease, as described, some of which has a left ostiomeatal unit obstructive pattern. Electronically Signed: By: Kellie Simmering D.O. On: 12/21/2021 08:44   DG Shoulder Left  Result Date: 12/20/2021 CLINICAL DATA:  Possible fall. EXAM: LEFT SHOULDER - 2+ VIEW COMPARISON:  None. FINDINGS: Degenerative changes in the left shoulder, most pronounced in the [facility], In joint with joint space narrowing and spurring. No acute bony abnormality. Specifically, no fracture, subluxation, or dislocation. IMPRESSION: No acute bony abnormality. Electronically Signed   By: Rolm Baptise M.D.   On: 12/20/2021 20:00   DG Foot 2 Views  Left  Result Date: 12/20/2021 CLINICAL DATA:  Possible fall. EXAM: LEFT FOOT - 2 VIEW COMPARISON:  None. FINDINGS: No acute bony abnormality. Specifically, no fracture, subluxation, or dislocation. Plantar calcaneal spur. Mild degenerative changes at the 1st MTP joint. IMPRESSION: No acute bony abnormality. Electronically Signed   By: Rolm Baptise M.D.   On: 12/20/2021 20:01    ROS Blood pressure 111/64, pulse 87, temperature 98.9 F (37.2 C), temperature source Oral, resp. rate 19, height 5\' 2"  (1.575 m), weight 76 kg, SpO2 100 %. Physical Exam HENT:     Head: Normocephalic.     Comments: No tenderness or swelling over the forehead.     Nose: Nose normal.  Eyes:     Conjunctiva/sclera: Conjunctivae normal.     Comments: Ptosis of the left eye. No swelling or erythema.   Musculoskeletal:     Cervical back: Neck supple.  Neurological:     Mental Status: She is alert.      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 This situation needs a rhinologist for evaluation and treatment which I recommended [facility]. Apparently ENT at [facility] says this is a neurosurgery problem and neurosurgery says it is an ENT problem at [facility]. It likely is a combination of the two.  I do not have the expertise to manage this problem properly here in [place]. It needs a tertiary care center. Let me know if ENT refuses to accept this patient and we can get administration involved. Continue her IV antibiotics for now  Melissa Montane 12/22/2021, 4:04 PM

## 2021-12-22 NOTE — Evaluation (Signed)
Occupational Therapy Evaluation Patient Details Name: Chelsea Gallegos MRN: [id number] DOB: 05-23-1934 Today's Date: 12/22/2021   History of Present Illness Pt is a 88 y.o. F who presents with a fall and weakness. Found to have SIRS, rhabdomyolysis, MRI brain concerning for subdural empyema resulting from left frontal sinusitis disease.  PMH significant for HTN, PAF, DM2, HLD, gout.   Clinical Impression   Patient admitted for the above diagnosis.  PTA she lives alone, and has some assist from family for home management and community mobility, but is largely independent with ADL/IADL and mobility.  Patient has had a significant decline in her function since last Tuesday, and deficits impacting independence are listed below.  Currently, she is needing up to +2 for basic mobility, and Max A for lower body ADL.  OT to follow her in the acute setting, but the patient would benefit from AIR, and a more aggressive approach to rehab post acute prior to returning home.       Recommendations for follow up therapy are one component of a multi-disciplinary discharge planning process, led by the attending physician.  Recommendations may be updated based on patient status, additional functional criteria and insurance authorization.   Follow Up Recommendations  Acute inpatient rehab (3hours/day)    Assistance Recommended at Discharge Frequent or constant Supervision/Assistance  Patient can return home with the following A lot of help with walking and/or transfers;A lot of help with bathing/dressing/bathroom;Direct supervision/assist for medications management;Assist for transportation;Direct supervision/assist for financial management;Assistance with cooking/housework    Functional Status Assessment  Patient has had a recent decline in their functional status and demonstrates the ability to make significant improvements in function in a reasonable and predictable amount of time.  Equipment  Recommendations  Tub/shower seat;BSC/3in1    Recommendations for Other Services       Precautions / Restrictions Precautions Precautions: Fall Restrictions Weight Bearing Restrictions: No      Mobility Bed Mobility Overal bed mobility: Needs Assistance Bed Mobility: Sidelying to Sit   Sidelying to sit: Mod assist;HOB elevated Supine to sit: Mod assist Sit to supine: Mod assist   General bed mobility comments: assist for BLE's, cues for reaching for rail, pushing to sit up    Transfers Overall transfer level: Needs assistance Equipment used: Ambulation equipment used Transfers: Sit to/from Stand;Bed to chair/wheelchair/BSC Sit to Stand: Mod assist;+2 physical assistance           General transfer comment: STEDY      Balance Overall balance assessment: Needs assistance Sitting-balance support: Feet supported Sitting balance-Leahy Scale: Fair Sitting balance - Comments: right lateral lean; able to correct with min cueing   Standing balance support: Bilateral upper extremity supported Standing balance-Leahy Scale: Poor Standing balance comment: heavy forward trunk flexion                           ADL either performed or assessed with clinical judgement   ADL Overall ADL's : Needs assistance/impaired Eating/Feeding: Set up;Sitting   Grooming: Wash/dry hands;Wash/dry face;Min guard;Sitting Grooming Details (indicate cue type and reason): Edge of bed Upper Body Bathing: Moderate assistance;Sitting   Lower Body Bathing: Maximal assistance;Bed level   Upper Body Dressing : Moderate assistance;Sitting   Lower Body Dressing: Maximal assistance;Bed level       Toileting- Clothing Manipulation and Hygiene: Bed level;Maximal assistance       Functional mobility during ADLs: +2 for physical assistance;Moderate assistance General ADL Comments: STEDY  Vision Patient Visual Report: No change from baseline       Perception  Perception Perception: Not tested   Praxis Praxis Praxis: Not tested    Pertinent Vitals/Pain Pain Assessment: Faces Faces Pain Scale: Hurts a little bit Pain Location: back Pain Descriptors / Indicators: Tightness;Discomfort Pain Intervention(s): Monitored during session     Hand Dominance Right   Extremity/Trunk Assessment Upper Extremity Assessment Upper Extremity Assessment: Generalized weakness   Lower Extremity Assessment Lower Extremity Assessment: Defer to PT evaluation RLE Deficits / Details: Grossly 3/5, edema distally LLE Deficits / Details: Grossly 2/5, edema distally   Cervical / Trunk Assessment Cervical / Trunk Assessment: Kyphotic   Communication Communication Communication: No difficulties   Cognition Arousal/Alertness: Lethargic Behavior During Therapy: WFL for tasks assessed/performed Overall Cognitive Status: Impaired/Different from baseline Area of Impairment: Problem solving                             Problem Solving: Decreased initiation General Comments: Daughter states the patient forgets things, and generally will sleep all day long unless they encourage her.     General Comments   VSS on RA    Exercises     Shoulder Instructions      Home Living Family/patient expects to be discharged to:: Private residence Living Arrangements: Alone Available Help at Discharge: Family;Available PRN/intermittently Type of Home: House Home Access: Level entry     Home Layout: One level     Bathroom Shower/Tub: International aid/development worker Accessibility: Yes How Accessible: Accessible via walker Home Equipment: Rolling Walker (2 wheels)          Prior Functioning/Environment Prior Level of Function : Independent/Modified Independent             Mobility Comments: Per PT eval: in past week had to start using RW, typically independent. Enjoys playing cards ADLs Comments: Stating last Tuesday she did not need any help  with self care, meals or medications.  Family will assist with home mangement and community mobility.        OT Problem List: Decreased strength;Decreased activity tolerance;Impaired balance (sitting and/or standing);Decreased safety awareness;Pain      OT Treatment/Interventions: Self-care/ADL training;Therapeutic exercise;Balance training;Patient/family education;Therapeutic activities;DME and/or AE instruction    OT Goals(Current goals can be found in the care plan section) Acute Rehab OT Goals Patient Stated Goal: Wants to return home OT Goal Formulation: With patient Time For Goal Achievement: 01/05/22 Potential to Achieve Goals: Good ADL Goals Pt Will Perform Grooming: standing;with supervision Pt Will Perform Lower Body Bathing: with min assist;sit to/from stand Pt Will Perform Lower Body Dressing: with min assist;sit to/from stand Pt Will Transfer to Toilet: with min assist;stand pivot transfer;bedside commode Pt Will Perform Toileting - Clothing Manipulation and hygiene: with min guard assist;sit to/from stand Pt/caregiver will Perform Home Exercise Program: Increased strength;Both right and left upper extremity;With theraband;With Supervision;With written HEP provided  OT Frequency: Min 2X/week    Co-evaluation              AM-PAC OT "6 Clicks" Daily Activity     Outcome Measure Help from another person eating meals?: None Help from another person taking care of personal grooming?: A Little Help from another person toileting, which includes using toliet, bedpan, or urinal?: A Lot Help from another person bathing (including washing, rinsing, drying)?: A Lot Help from another person to put on and taking off regular upper body clothing?: A Lot  Help from another person to put on and taking off regular lower body clothing?: A Lot 6 Click Score: 15   End of Session Equipment Utilized During Treatment: Other (comment);Gait belt (STEDY) Nurse Communication: Need for lift  equipment  Activity Tolerance: Patient tolerated treatment well Patient left: in chair;with call bell/phone within reach;with nursing/sitter in room;with family/visitor present  OT Visit Diagnosis: Unsteadiness on feet (R26.81);Other abnormalities of gait and mobility (R26.89);Muscle weakness (generalized) (M62.81);Repeated falls (R29.6)                Time: 7354-3014 OT Time Calculation (min): 22 min Charges:  OT General Charges $OT Visit: 1 Visit OT Evaluation $OT Eval Moderate Complexity: 1 Mod  12/22/2021  RP, OTR/L  Acute Rehabilitation Services  Office:  [phone]   Metta Clines 12/22/2021, 5:00 PM

## 2021-12-22 NOTE — Progress Notes (Addendum)
Interim Progress Note Care Consult Phone Calls  Spoke with Neurosurgery, Dr. Kathyrn Sheriff, about patient MRI findings of subdural empyema. Neurosurgery noted that this finding is an extension of the sinuses, likely in the setting of severe frontal sinusitis. Given this, it was recommended that ENT be consulted to determine management. Neurosurgery didn't feel like patient needed surgery at this time, but was willing to be available for intraoperative debridement as assist to ENT. Appreciated Neurosurgery for their recs.   Spoke with [facility], Dr. Janace Hoard about patient and management of care. Dr. Etheleen Sia noted that he did not have expertise in this type of case and that patient would likely need ENT surgery. He recommended we reach out to [facility] ENT surgery.   I reached out to [facility]NT and spoke with a Dr. Terrilyn Saver. He felt like this case would be more appropriate for neurosurgery.   I reached out to [facility] Neurosurgery and spoke to Dr. Monika Salk who thought this case was appropriate for Neurosurgery, but felt like it should be something handled by [facility]. He encouraged [place] to reach out to [facility] Neurosurgery and was willing to be available to discuss case with [facility].   I reached out to [facility] Neurosurgery, Dr. Kathyrn Sheriff, who still felt like this case was most appropriate for ENT. He was willing to assist in any surgery, but felt like it would be best managed by ENT.   I reached out to [facility] ENT, Dr. Etheleen Sia, who saw the patient and again recommended [facility] ENT for case.   Spoke with [facility] Neurosurgery and ENT about transferring patient to [facility], but there was no room on the Medicine or ENT floor. [facility] Neurosurgery felt that patient should be able to have surgery through [facility] Neurosurgery, and was inappropriate for [facility] Neurosurgery. It was determined that patient would need to be transferred to alternate facility.   Holley Bouche, MD [place] PGY-1

## 2021-12-22 NOTE — Evaluation (Signed)
Physical Therapy Evaluation Patient Details Name: Chelsea Gallegos MRN: [id number] DOB: [birth year] Today's Date: 12/22/2021  History of Present Illness  Pt is a 88 y.o. F who presents with a fall and weakness. Found to have SIRS, rhabdomyolysis, MRI brain concerning for subdural empyema resulting from left frontal sinusitis disease.  PMH significant for HTN, PAF, DM2, HLD, gout.  Clinical Impression  PTA, pt lives alone and is independent. Pt presents with decreased functional mobility secondary to generalized weakness, poor sitting/standing balance, decreased activity tolerance. This is a significant change from her baseline. Pt unable to stand with a walker. Able to stand via face to face transfer and moderate assist. Would likely benefit from Denna Haggard for transfers out of bed initially. Recommend AIR to address deficits and maximize functional mobility.      Recommendations for follow up therapy are one component of a multi-disciplinary discharge planning process, led by the attending physician.  Recommendations may be updated based on patient status, additional functional criteria and insurance authorization.  Follow Up Recommendations Acute inpatient rehab (3hours/day)    Assistance Recommended at Discharge Frequent or constant Supervision/Assistance  Patient can return home with the following  Two people to help with walking and/or transfers;A lot of help with bathing/dressing/bathroom;Assistance with cooking/housework    Equipment Recommendations Rolling walker (2 wheels);BSC/3in1;Wheelchair (measurements PT);Wheelchair cushion (measurements PT)  Recommendations for Other Services  Rehab consult    Functional Status Assessment Patient has had a recent decline in their functional status and demonstrates the ability to make significant improvements in function in a reasonable and predictable amount of time.     Precautions / Restrictions Precautions Precautions:  Fall Restrictions Weight Bearing Restrictions: No      Mobility  Bed Mobility Overal bed mobility: Needs Assistance Bed Mobility: Supine to Sit;Sit to Supine     Supine to sit: Mod assist Sit to supine: Mod assist   General bed mobility comments: assist for BLE's, cues for reaching for rail, pushing to sit up    Transfers Overall transfer level: Needs assistance Equipment used: Rolling walker (2 wheels);None Transfers: Sit to/from Stand Sit to Stand: Mod assist;Max assist           General transfer comment: Initially attempted rising to walker x 2, with cues for hand/foot placement, rocking to gain momentum; pt ultimately unable to achieve standing position. Performed face to face transfer with heavy modA + 2, cues for glute activation and upper trunk control    Ambulation/Gait               General Gait Details: unable  Stairs            Wheelchair Mobility    Modified Rankin (Stroke Patients Only)       Balance Overall balance assessment: Needs assistance Sitting-balance support: Feet supported Sitting balance-Leahy Scale: Fair Sitting balance - Comments: right lateral lean; able to correct with min cueing   Standing balance support: Bilateral upper extremity supported Standing balance-Leahy Scale: Poor Standing balance comment: heavy forward trunk flexion                             Pertinent Vitals/Pain Pain Assessment: Faces Faces Pain Scale: Hurts little more Pain Location: back Pain Descriptors / Indicators: Grimacing;Guarding Pain Intervention(s): Monitored during session    Home Living Family/patient expects to be discharged to:: Private residence Living Arrangements: Alone Available Help at Discharge: Family;Available PRN/intermittently (daughter lives in [place]) Type of  Home: House Home Access: Level entry       Home Layout: One level Home Equipment: Conservation officer, nature (2 wheels) (borrowing from a friend)       Prior Function Prior Level of Function : Independent/Modified Independent             Mobility Comments: in past week had to start using RW, typically independent. Enjoys playing cards       Hand Dominance        Extremity/Trunk Assessment   Upper Extremity Assessment Upper Extremity Assessment: Defer to OT evaluation    Lower Extremity Assessment Lower Extremity Assessment: RLE deficits/detail;LLE deficits/detail RLE Deficits / Details: Grossly 3/5, edema distally LLE Deficits / Details: Grossly 2/5, edema distally       Communication   Communication: No difficulties  Cognition Arousal/Alertness: Awake/alert Behavior During Therapy: WFL for tasks assessed/performed Overall Cognitive Status: Impaired/Different from baseline Area of Impairment: Problem solving                             Problem Solving: Slow processing General Comments: A&Ox4, increased time to follow commands        General Comments      Exercises     Assessment/Plan    PT Assessment Patient needs continued PT services  PT Problem List Decreased strength;Decreased activity tolerance;Decreased balance;Decreased mobility;Decreased cognition;Decreased safety awareness       PT Treatment Interventions DME instruction;Gait training;Functional mobility training;Balance training;Therapeutic activities;Therapeutic exercise;Patient/family education    PT Goals (Current goals can be found in the Care Plan section)  Acute Rehab PT Goals Patient Stated Goal: did not state PT Goal Formulation: With patient Time For Goal Achievement: 01/05/22 Potential to Achieve Goals: Good    Frequency Min 3X/week     Co-evaluation               AM-PAC PT "6 Clicks" Mobility  Outcome Measure Help needed turning from your back to your side while in a flat bed without using bedrails?: A Little Help needed moving from lying on your back to sitting on the side of a flat bed without using  bedrails?: A Lot Help needed moving to and from a bed to a chair (including a wheelchair)?: A Lot Help needed standing up from a chair using your arms (e.g., wheelchair or bedside chair)?: A Lot Help needed to walk in hospital room?: Total Help needed climbing 3-5 steps with a railing? : Total 6 Click Score: 11    End of Session Equipment Utilized During Treatment: Gait belt Activity Tolerance: Patient tolerated treatment well Patient left: in bed;with call bell/phone within reach;with bed alarm set;with family/visitor present Nurse Communication: Mobility status PT Visit Diagnosis: Unsteadiness on feet (R26.81);Muscle weakness (generalized) (M62.81);Difficulty in walking, not elsewhere classified (R26.2)    Time: 6629-4765 PT Time Calculation (min) (ACUTE ONLY): 29 min   Charges:   PT Evaluation $PT Eval Moderate Complexity: 1 Mod PT Treatments $Therapeutic Activity: 8-22 mins        Wyona Almas, PT, DPT Acute Rehabilitation Services Pager [phone] Office [phone]   Deno Etienne 12/22/2021, 4:13 PM

## 2021-12-22 NOTE — Progress Notes (Signed)
Family Medicine Teaching Service Daily Progress Note Intern Pager: [phone]  Patient name: CHANDRIKA SANDLES Medical record number: [id number] Date of birth: 12/04/1934 Age: 88 y.o. Gender: female  Primary Care Provider: Zola Button, MD Consultants: Neurosurgery  Code Status: DNI  Pt Overview and Major Events to Date:  12/20/21 - Patient admitted  Assessment and Plan:  NOVA SCHMUHL is a 88 y.o. female presenting with fall and weakness. PMH is significant for HTN, PAF, DM2, HLD, and gout.   Fall   Weakness Patient complains of soreness in back and shoulder, but has good range of motion. Leg exam stable from yesterday with left leg slightly weaker than right.  -PT/OT eval and treat  SIRS   Sepsis HR stable, non tachycardic. Patient non tachypnic and afebrile overnight. Urine Cx showed multiple species, suggesting recollection. Will likely not repeat urine culture as patient is on antibiotics. Blood Cx NGTD. MRI concerning for subdural empyema, will consult neurosurgery/ENT -Consult neurosurgery  -Continue Vancomycin 750 IV daily -Continue Cefipime 2 g IV BID -Continue mIVF  Elevated CK   Rhabdomyolysis CK 1,111 this morning, down from 2,301. Can now consider resolved.  HTN   Hypotension BP stable ranging from 110-130's/60-70's.  PAF Pulse between 70's-90's.  -Continue apixiban 5 mg daily  T2DM BG range from 72 - 155 yesterday. Stable, patient received no short acting insulin.  Hypokalemia (resolved) K 3.7 today, improved from yesterday 3.5  HLD Consider resuming statin  ETOH use CIWA 0  yesterday evening, patient not withdrawing from EtOH -d/c CIWA  FEN/GI: Carb modified PPx: Full dose Eliquis Dispo:Home pending clinical improvement .    Subjective:  Patient sitting comfortably in bed. Complains of soreness in shoulder and back. Patient has no complaints or issues with sinuses/nose.   Objective: Temp:  [98.4 F (36.9 C)-99.8 F (37.7 C)] 99.5 F (37.5  C) (01/14 0602) Pulse Rate:  [76-96] 83 (01/14 0602) Resp:  [17-19] 17 (01/14 0602) BP: (107-130)/(56-78) 111/65 (01/14 0602) SpO2:  [95 %-100 %] 96 % (01/14 0602) Physical Exam: General: Well appearing, good mood, cooperative, NAD, African American female Cardiovascular: RRR, [place] Respiratory: CTABL Abdomen: Soft, nttp, non-distended Extremities: Moving extremities independently, no edema  Laboratory: Recent Labs  Lab 12/20/21 1138 12/21/21 0330 12/22/21 0219  WBC 11.9* 11.8* 12.1*  HGB 12.8 11.9* 10.6*  HCT 37.9 35.5* 31.1*  PLT 209 183 168   Recent Labs  Lab 12/20/21 1138 12/21/21 0330 12/21/21 1459 12/22/21 0219  NA 136 140 136 136  K 2.6* 3.2* 3.5 3.7  CL 103 106 106 108  CO2 23 24 21* 23  BUN 10 9 10 9   CREATININE 0.94 0.82 0.80 0.73  CALCIUM 8.3* 8.1* 8.2* 7.7*  PROT 6.5  --   --   --   BILITOT 1.2  --   --   --   ALKPHOS 57  --   --   --   ALT 28  --   --   --   AST 72*  --   --   --   GLUCOSE 222* 114* 154* 155*      Imaging/Diagnostic Tests:   Holley Bouche, MD 12/22/2021, 6:18 AM PGY-1, [place] Intern pager: [phone], text pages welcome

## 2021-12-23 DIAGNOSIS — E785 Hyperlipidemia, unspecified: Secondary | ICD-10-CM | POA: Diagnosis not present

## 2021-12-23 DIAGNOSIS — H269 Unspecified cataract: Secondary | ICD-10-CM | POA: Diagnosis not present

## 2021-12-23 DIAGNOSIS — E119 Type 2 diabetes mellitus without complications: Secondary | ICD-10-CM | POA: Diagnosis not present

## 2021-12-23 DIAGNOSIS — H401133 Primary open-angle glaucoma, bilateral, severe stage: Secondary | ICD-10-CM | POA: Diagnosis not present

## 2021-12-23 DIAGNOSIS — J329 Chronic sinusitis, unspecified: Secondary | ICD-10-CM | POA: Diagnosis not present

## 2021-12-23 DIAGNOSIS — G609 Hereditary and idiopathic neuropathy, unspecified: Secondary | ICD-10-CM | POA: Diagnosis not present

## 2021-12-23 DIAGNOSIS — Z743 Need for continuous supervision: Secondary | ICD-10-CM | POA: Diagnosis not present

## 2021-12-23 DIAGNOSIS — J309 Allergic rhinitis, unspecified: Secondary | ICD-10-CM | POA: Diagnosis not present

## 2021-12-23 DIAGNOSIS — J3489 Other specified disorders of nose and nasal sinuses: Secondary | ICD-10-CM | POA: Diagnosis not present

## 2021-12-23 DIAGNOSIS — R52 Pain, unspecified: Secondary | ICD-10-CM | POA: Diagnosis not present

## 2021-12-23 DIAGNOSIS — K5900 Constipation, unspecified: Secondary | ICD-10-CM | POA: Diagnosis not present

## 2021-12-23 DIAGNOSIS — J338 Other polyp of sinus: Secondary | ICD-10-CM | POA: Diagnosis not present

## 2021-12-23 DIAGNOSIS — K219 Gastro-esophageal reflux disease without esophagitis: Secondary | ICD-10-CM | POA: Diagnosis not present

## 2021-12-23 DIAGNOSIS — J0110 Acute frontal sinusitis, unspecified: Secondary | ICD-10-CM | POA: Diagnosis not present

## 2021-12-23 DIAGNOSIS — E113299 Type 2 diabetes mellitus with mild nonproliferative diabetic retinopathy without macular edema, unspecified eye: Secondary | ICD-10-CM | POA: Diagnosis not present

## 2021-12-23 DIAGNOSIS — G062 Extradural and subdural abscess, unspecified: Secondary | ICD-10-CM | POA: Diagnosis not present

## 2021-12-23 DIAGNOSIS — M7989 Other specified soft tissue disorders: Secondary | ICD-10-CM | POA: Diagnosis not present

## 2021-12-23 DIAGNOSIS — I480 Paroxysmal atrial fibrillation: Secondary | ICD-10-CM | POA: Diagnosis not present

## 2021-12-23 DIAGNOSIS — H34822 Venous engorgement, left eye: Secondary | ICD-10-CM | POA: Diagnosis not present

## 2021-12-23 DIAGNOSIS — J321 Chronic frontal sinusitis: Secondary | ICD-10-CM | POA: Diagnosis not present

## 2021-12-23 DIAGNOSIS — I10 Essential (primary) hypertension: Secondary | ICD-10-CM | POA: Diagnosis not present

## 2021-12-23 LAB — BASIC METABOLIC PANEL
Anion gap: 8 (ref 5–15)
BUN: 6 mg/dL — ABNORMAL LOW (ref 8–23)
CO2: 25 mmol/L (ref 22–32)
Calcium: 8 mg/dL — ABNORMAL LOW (ref 8.9–10.3)
Chloride: 107 mmol/L (ref 98–111)
Creatinine, Ser: 0.69 mg/dL (ref 0.44–1.00)
GFR, Estimated: >60 mL/min (ref >60)
Glucose, Bld: 141 mg/dL — ABNORMAL HIGH (ref 70–99)
Potassium: 3.2 mmol/L — ABNORMAL LOW (ref 3.5–5.1)
Sodium: 140 mmol/L (ref 135–145)

## 2021-12-23 LAB — CBC
HCT: 32.6 % — ABNORMAL LOW (ref 36.0–46.0)
Hemoglobin: 11 g/dL — ABNORMAL LOW (ref 12.0–15.0)
MCH: 31.2 pg (ref 26.0–34.0)
MCHC: 33.7 g/dL (ref 30.0–36.0)
MCV: 92.4 fL (ref 80.0–100.0)
Platelets: 199 10*3/uL (ref 150–400)
RBC: 3.53 MIL/uL — ABNORMAL LOW (ref 3.87–5.11)
RDW: 13.2 % (ref 11.5–15.5)
WBC: 10.1 10*3/uL (ref 4.0–10.5)
nRBC: 0 % (ref 0.0–0.2)

## 2021-12-23 LAB — GLUCOSE, CAPILLARY
Glucose-Capillary: 136 mg/dL — ABNORMAL HIGH (ref 70–99)
Glucose-Capillary: 132 mg/dL — ABNORMAL HIGH (ref 70–99)
Glucose-Capillary: 169 mg/dL — ABNORMAL HIGH (ref 70–99)

## 2021-12-23 LAB — VANCOMYCIN, PEAK: Vancomycin Pk: 17 ug/mL — ABNORMAL LOW (ref 30–40)

## 2021-12-23 MED ORDER — SODIUM CHLORIDE 0.9 % IV SOLN: 2.0000 g | Freq: Two times a day (BID) | INTRAVENOUS | Status: DC

## 2021-12-23 MED ORDER — VANCOMYCIN HCL 750 MG/150ML IV SOLN
750.0000 mg | INTRAVENOUS | Status: AC
Start: 2021-12-24 — End: ?

## 2021-12-23 MED ORDER — POTASSIUM CHLORIDE CRYS ER 20 MEQ PO TBCR
40.0000 meq | EXTENDED_RELEASE_TABLET | Freq: Once | ORAL | Status: AC
  Administered 2021-12-23: 40 meq via ORAL
  Filled 2021-12-23: qty 2

## 2021-12-23 MED ORDER — ADULT MULTIVITAMIN W/MINERALS CH: 1.0000 | ORAL_TABLET | Freq: Every day | ORAL | Status: DC

## 2021-12-23 MED ORDER — ATORVASTATIN CALCIUM 40 MG PO TABS
40.0000 mg | ORAL_TABLET | Freq: Every day | ORAL | Status: DC
  Administered 2021-12-23: 40 mg via ORAL
  Filled 2021-12-23: qty 1

## 2021-12-23 MED ORDER — ENSURE ENLIVE PO LIQD: 237.0000 mL | Freq: Two times a day (BID) | ORAL | 12 refills | Status: DC

## 2021-12-23 MED ORDER — THIAMINE HCL 100 MG PO TABS: 100.0000 mg | ORAL_TABLET | Freq: Every day | ORAL | Status: DC

## 2021-12-23 MED ORDER — ACETAMINOPHEN 325 MG PO TABS: 650.0000 mg | ORAL_TABLET | Freq: Four times a day (QID) | ORAL | Status: AC | PRN

## 2021-12-23 MED ORDER — FOLIC ACID 1 MG PO TABS: 1.0000 mg | ORAL_TABLET | Freq: Every day | ORAL | Status: DC

## 2021-12-23 NOTE — Progress Notes (Signed)
Called [facility] line to consult with ENT for transfer.  Was notified of the below:  1/14 9:41 AM-Dr. Marcina Millard consulted [facility] ENT Dr. Sherren Mocha 11:32 AM-Dr. Marcina Millard consulted [facility] neurosurgery Dr. Fransisca Kaufmann 4:52 PM-Dr. Marcina Millard reconsulted with [facility] ENT 7:10 PM-Dr. Marcina Millard reconsulted with [facility] neurosurgery Dr. Fransisca Kaufmann and was told this could be managed at [facility] and if the neurosurgery department at [facility] felt the need for transfer that they would need to directly consult [facility] neurosurgery.   1/15 I will plan to reach out to [facility] neurosurgery and passed this message.  I will also notify my attending Dr. Erin Hearing of this message.  Gerlene Fee, DO 12/23/2021, 1:23 PM PGY-3, [place]

## 2021-12-23 NOTE — Progress Notes (Addendum)
Family Medicine Teaching Service Daily Progress Note Intern Pager: [phone]  Patient name: Chelsea Gallegos Medical record number: [id number] Date of birth: 05/24/1934 Age: 88 y.o. Gender: female  Primary Care Provider: Zola Button, MD Consultants: Neurosx Code Status: DNI  Pt Overview and Major Events to Date:  1/12- Admitted 1/14-Neuro sx consulted  Assessment and Plan: Chelsea Gallegos is a 88 y.o. female presenting with fall and weakness. PMH is significant for HTN, PAF, DM2, HLD, and gout.  Fall   Weakness Last fall 1/12, 1 fall prior to that. She lives along and does not require assistive walking devices.  PT/OT recommendation of acute inpatient rehab. -Consult to CIR when below issue is resolved   SIRS   Sepsis 2/2 subdural empyema VSS. Neurosurgery/ENT consulted.  ENT recommended transfer to [facility] for rhinologist via tertiary care by way of ENT v.  Neurosurgery. -Decipher level of care and specialty need -Consider ID consult -Continue Vancomycin 750 IV daily -Continue Cefipime 2 g IV BID -Continue IVF 175 mL/h   PAF, stable HR 86. -Continue apixiban 5 mg daily   T2DM CBG 141.  -Continue sssI   Hypokalemia K 3.2.  - Replete PRN   HLD -Restart lipitor 40 mg daily   Resolved/Stable problems: Elevated CK   Rhabdomyolysis, Resolved HTN   Hypotension, Stable- BP 133/86 ETOH use- CIWAs discontinued  FEN/GI: Carb modified PPx: Full dose Eliquis Dispo:Home pending clinical improvement .    Subjective:  Doing well no concerns at this time. No acute events overnight.   Objective: Temp:  [98.3 F (36.8 C)-100.4 F (38 C)] 100 F (37.8 C) (01/15 0807) Pulse Rate:  [84-88] 86 (01/15 0807) Resp:  [18-19] 19 (01/15 0807) BP: (111-154)/(64-86) 133/86 (01/15 0807) SpO2:  [97 %-100 %] 97 % (01/15 0807)  Physical Exam: General: Initially sleeping. Appears well, no acute distress. Age appropriate. Cardiac: RRR, normal heart sounds, no  murmurs Respiratory: CTAB, normal effort Abdomen: soft, nontender, nondistended Extremities: No edema or cyanosis. Skin: Warm and dry, no rashes noted Neuro: alert and oriented Psych: normal affect  Laboratory: Recent Labs  Lab 12/21/21 0330 12/22/21 0219 12/23/21 0739  WBC 11.8* 12.1* 10.1  HGB 11.9* 10.6* 11.0*  HCT 35.5* 31.1* 32.6*  PLT 183 168 199   Recent Labs  Lab 12/20/21 1138 12/21/21 0330 12/21/21 1459 12/22/21 0219 12/23/21 0739  NA 136   < > 136 136 140  K 2.6*   < > 3.5 3.7 3.2*  CL 103   < > 106 108 107  CO2 23   < > 21* 23 25  BUN 10   < > 10 9 6*  CREATININE 0.94   < > 0.80 0.73 0.69  CALCIUM 8.3*   < > 8.2* 7.7* 8.0*  PROT 6.5  --   --   --   --   BILITOT 1.2  --   --   --   --   ALKPHOS 57  --   --   --   --   ALT 28  --   --   --   --   AST 72*  --   --   --   --   GLUCOSE 222*   < > 154* 155* 141*   < > = values in this interval not displayed.    Imaging/Diagnostic Tests: No new imaging.  Chelsea Fee, DO 12/23/2021, 8:33 AM PGY-3, [place] Intern pager: [phone], text pages welcome

## 2021-12-23 NOTE — Discharge Summary (Signed)
[facility] Discharge Summary  Patient name: Chelsea Gallegos Medical record number: [id number] Date of birth: December 05, 1934 Age: 88 y.o. Gender: female Date of Admission: 12/20/2021  Date of Discharge: 12/23/2021 Admitting Physician: Martyn Malay, MD  Primary Care Provider: Zola Button, MD Consultants: Neurosurgery, ENT  Indication for Hospitalization: Fall/weakness  Discharge Diagnoses/Problem List:  Principal Problem:   Sepsis [facility]) Active Problems:   Hypertension   Hyperlipidemia   Weakness Subdural empyema   Disposition: Transfer to [facility] for specialty care due to subdural empyema  Discharge Condition: Stable  Discharge Exam:  Per Dr. Janus Molder earlier today: General: Initially sleeping. Appears well, no acute distress. Age appropriate. Cardiac: RRR, normal heart sounds, no murmurs Respiratory: CTAB, normal effort Abdomen: soft, nontender, nondistended Extremities: No edema or cyanosis. Skin: Warm and dry, no rashes noted Neuro: alert and oriented Psych: normal affect  Brief Hospital Course:  Chelsea Gallegos is a 88 y.o. female presenting with fall and weakness. PMH is significant for HTN, PAF, DM2, HLD, and gout. Her medical course is below.  Fall   Weakness Patient presented with a fall after losing her balance the morning of admission.  In the ED she was noted to have a fever, started on broad-spectrum antibiotics including vancomycin and cefepime.  Because she had had multiple falls recently a more thorough work-up was done including CT head and cervical spine which showed no acute changes.  She then had an MRI brain which showed concern for subdural empyema with recommendation for neurosurgical consultation.  PT and OT were consulted and they recommended CIR.  This was however put on hold due to subdural empyema, see below.  SIRS   Sepsis 2/2 subdural empyema Patient presented to the ED after a fall but was found to  have a fever.  She was started on vancomycin and cefepime.  Further investigation including MRI revealed what appeared to be a subdural empyema.  Neurology and ENT were consulted.  It was ultimately determined that the patient needed a higher level of care and she was transferred to [facility].  The transfer occurred on 1/15.  Elevated CK   Rhabdomyolysis Patient was noted to have an elevated CK at time of admission of 3327, this improved to 1111 on 1/14.  Thought likely due to trauma from her fall.  Patient was transferred to [facility] on 12/23/2021 for higher level of care for her subdural empyema.  Issues for PCP f/u: 1.6 cm densely calcified meningioma at the right vertex with no associated edema seen on MRI. Recommend outpatient physical therapy if she does not get an inpatient physical therapy rehab program.   Significant Procedures: None  Significant Labs and Imaging:  Recent Labs  Lab 12/21/21 0330 12/22/21 0219 12/23/21 0739  WBC 11.8* 12.1* 10.1  HGB 11.9* 10.6* 11.0*  HCT 35.5* 31.1* 32.6*  PLT 183 168 199   Recent Labs  Lab 12/20/21 1138 12/20/21 1433 12/21/21 0330 12/21/21 1459 12/22/21 0219 12/22/21 0631 12/23/21 0739  NA 136  --  140 136 136  --  140  K 2.6*  --  3.2* 3.5 3.7  --  3.2*  CL 103  --  106 106 108  --  107  CO2 23  --  24 21* 23  --  25  GLUCOSE 222*  --  114* 154* 155*  --  141*  BUN 10  --  9 10 9   --  6*  CREATININE 0.94  --  0.82  0.80 0.73  --  0.69  CALCIUM 8.3*  --  8.1* 8.2* 7.7*  --  8.0*  MG  --  1.9  --   --   --   --   --   ALKPHOS 57  --   --   --   --   --   --   AST 72*  --   --   --   --   --   --   ALT 28  --   --   --   --   --   --   ALBUMIN 2.7*  --   --   --   --  2.1*  --       Results/Tests Pending at Time of Discharge: None  Discharge Medications (continued with current medications as she was transferred to [facility] for ongoing treatment and work-up):  Allergies as of 12/23/2021       Reactions    Netarsudil Other (See Comments)   Other reaction(s): Other (See Comments) Red with like blood running out of her eyes Red with like blood running out of her eyes   Tafluprost (pf) Other (See Comments)   Other reaction(s): Other (See Comments) Red with like blood running out of her eyes Red with like blood running out of her eyes        Medication List     STOP taking these medications    potassium chloride 10 MEQ tablet Commonly known as: KLOR-CON M   prednisoLONE acetate 1 % ophthalmic suspension Commonly known as: PRED FORTE       TAKE these medications    acetaminophen 325 MG tablet Commonly known as: TYLENOL Take 2 tablets (650 mg total) by mouth every 6 (six) hours as needed for fever.   apixaban 5 MG Tabs tablet Commonly known as: ELIQUIS Take 1 tablet (5 mg total) by mouth 2 (two) times daily.   aspirin 81 MG chewable tablet Chew 162 mg by mouth once.   atorvastatin 40 MG tablet Commonly known as: LIPITOR Take 1 tablet (40 mg total) by mouth daily at 6 PM.   ceFEPIme 2 g in sodium chloride 0.9 % 100 mL Inject 2 g into the vein every 12 (twelve) hours. Start taking on: December 24, 2021   digoxin 0.125 MG tablet Commonly known as: LANOXIN Take 1 tablet (0.125 mg total) by mouth every Monday, Wednesday, and Friday.   diltiazem 60 MG tablet Commonly known as: CARDIZEM Take 1 tablet (60 mg total) by mouth 2 (two) times daily.   dorzolamide 2 % ophthalmic solution Commonly known as: TRUSOPT Place 1 drop into both eyes 2 (two) times daily.   feeding supplement Liqd Take 237 mLs by mouth 2 (two) times daily between meals. Start taking on: December 24, 2021   fluorometholone 0.1 % ophthalmic suspension Commonly known as: FML Place 1 drop into both eyes 2 (two) times daily.   folic acid 1 MG tablet Commonly known as: FOLVITE Take 1 tablet (1 mg total) by mouth daily. Start taking on: December 24, 2021   metFORMIN 500 MG tablet Commonly known as:  GLUCOPHAGE Take 1 tablet (500 mg total) by mouth 2 (two) times daily with a meal.   multivitamin with minerals Tabs tablet Take 1 tablet by mouth daily. Start taking on: December 24, 2021   thiamine 100 MG tablet Take 1 tablet (100 mg total) by mouth daily. Start taking on: December 24, 2021   vancomycin 750 MG/150ML  Soln Commonly known as: VANCOREADY Inject 150 mLs (750 mg total) into the vein daily. Start taking on: December 24, 2021   Vitamin D (Ergocalciferol) 1.25 MG (50000 UNIT) Caps capsule Commonly known as: DRISDOL Take 50,000 Units by mouth once a week.        Discharge Instructions: Please refer to Patient Instructions section of EMR for full details.  Patient was counseled important signs and symptoms that should prompt return to medical care, changes in medications, dietary instructions, activity restrictions, and follow up appointments.   Follow-Up Appointments:   Lurline Del, DO 12/23/2021, 11:38 PM PGY-3, [place]

## 2021-12-23 NOTE — Progress Notes (Signed)
Pt and daughter said the pt doesn't take metformin or insulin. Refused insulin.

## 2021-12-23 NOTE — Progress Notes (Signed)
Called back by [facility] transfer center.  Will accept patient under neurosurgery.  Plan for OR tomorrow or Tuesday.  Working to get patient transferred.  Transfer center has nursing floor number to start this process.  Will be accepted under provider Dr. Lonny Prude.  Patient and patient daughter made aware of transfer. Nursing staff made aware.   Gerlene Fee, DO 12/23/2021, 6:25 PM PGY-3, [place]

## 2021-12-23 NOTE — Progress Notes (Addendum)
Previously spoke with Dr. Kathyrn Sheriff neurosurgery.  Was told that he spoke with [facility] and there were no more beds available.  Suggested reaching out to [facility] for transfer.    Called [facility] transfer line.  Patient information given.  Asked to upload patient imaging to power share for [facility] physician to review.  They will call back for further clarification and possible transfer.  Provider personal number given, interim patient given, resident lounge number given.  [place], DO 12/23/2021, 4:58 PM PGY-3, [place]

## 2021-12-23 NOTE — Progress Notes (Signed)
FPTS Brief Progress Note  S: Patient sleeping comfortably in bed.  Did not awaken the patient.  No complaints noted in chart.  Discussed with RN at bedside, no concerns.   O: BP 135/72    Pulse 84    Temp 98.8 F (37.1 C) (Oral)    Resp 18    Ht 5\' 2"  (1.575 m)    Wt 76 kg    SpO2 98%    BMI 30.65 kg/m     A/P: Sepsis Afebrile overnight.  Temp measured at 100.4 F, was rechecked and found to be 98.8 F.  Did not initiate fever work-up. - Continue Vanco and cefepime per day team plan - Continue to work with ENT and neurosurgery regarding possible subdural empyema   - Orders reviewed. Labs for AM ordered, which was adjusted as needed.    Ezequiel [place], MD 12/23/2021, 1:18 AM PGY-2, [facility] Night Resident  Please page [phone] with questions.

## 2021-12-23 NOTE — Progress Notes (Signed)
Pharmacy Antibiotic Note  Chelsea Gallegos is a 88 y.o. female admitted on 12/20/2021 with sepsis of unknown source.  Pharmacy has been consulted for vancomycin and cefepime dosing.  Pt with complicated sinusitis with intracranial complication. She is on D4 vanc/cefepime empirically. Her blood cultures have remained neg. Wbc trended down to normal. Had some low grade fever yesterday and today. Plan is to transfer to tertiary center for treatment if possible.   Plan: Cefepime 2g IV q 12h Vancomycin 750mg  IV q 24h (estimated AUC 413) Will get level today/tomorrow  Height: 5\' 2"  (157.5 cm) Weight: 76 kg (167 lb 8.8 oz) IBW/kg (Calculated) : 50.1  Temp (24hrs), Avg:99.2 F (37.3 C), Min:98.3 F (36.8 C), Max:100.4 F (38 C)  Recent Labs  Lab 12/20/21 1138 12/20/21 1433 12/21/21 0330 12/21/21 1459 12/22/21 0219 12/23/21 0739  WBC 11.9*  --  11.8*  --  12.1* 10.1  CREATININE 0.94  --  0.82 0.80 0.73  --   LATICACIDVEN 2.0* 1.8  --   --   --   --      Estimated Creatinine Clearance: 49.1 mL/min (by C-G formula based on SCr of 0.73 mg/dL).    Allergies  Allergen Reactions   Netarsudil Other (See Comments)    Other reaction(s): Other (See Comments) Red with like blood running out of her eyes Red with like blood running out of her eyes    Tafluprost (Pf) Other (See Comments)    Other reaction(s): Other (See Comments) Red with like blood running out of her eyes Red with like blood running out of her eyes     Antimicrobials this admission: 1/12 vancomycin >>  1/12 cefepime >>  1/12 metronidazole >>1/12  Dose adjustments this admission: none  Microbiology results: 1/12 Bcx>ngtd 1/12 Ucx>>poor sample  Onnie Boer, PharmD, BCIDP, AAHIVP, CPP Infectious Disease Pharmacist 12/23/2021 8:27 AM

## 2021-12-23 NOTE — Progress Notes (Signed)
Inpatient Rehab Admissions Coordinator Note:   Per PT/OT patient was screened for CIR candidacy by Kanyia Heaslip Danford Bad, CCC-SLP. At this time, pt is not medically ready for CIR. Pt has a possible surgical intervention pending. AC will not pursue a rehab consult for this pt at this time. CIR admissions team will follow from a distance to monitor medical workup and progress with therapies.    Chelsea Gallegos, [place], [place] Admissions Coordinator [phone], 12/23/21 1:05 PM

## 2021-12-25 LAB — CULTURE, BLOOD (ROUTINE X 2)
Culture: NO GROWTH
Culture: NO GROWTH

## 2021-12-31 MED FILL — Fentanyl Citrate Preservative Free (PF) Inj 100 MCG/2ML: INTRAMUSCULAR | Qty: 0.5 | Status: AC

## 2022-01-01 DIAGNOSIS — G062 Extradural and subdural abscess, unspecified: Secondary | ICD-10-CM | POA: Diagnosis not present

## 2022-01-01 DIAGNOSIS — Z743 Need for continuous supervision: Secondary | ICD-10-CM | POA: Diagnosis not present

## 2022-01-01 DIAGNOSIS — I480 Paroxysmal atrial fibrillation: Secondary | ICD-10-CM | POA: Diagnosis not present

## 2022-01-01 DIAGNOSIS — R52 Pain, unspecified: Secondary | ICD-10-CM | POA: Diagnosis not present

## 2022-01-01 DIAGNOSIS — E119 Type 2 diabetes mellitus without complications: Secondary | ICD-10-CM | POA: Diagnosis not present

## 2022-01-01 DIAGNOSIS — K219 Gastro-esophageal reflux disease without esophagitis: Secondary | ICD-10-CM | POA: Diagnosis not present

## 2022-01-01 DIAGNOSIS — G609 Hereditary and idiopathic neuropathy, unspecified: Secondary | ICD-10-CM | POA: Diagnosis not present

## 2022-01-01 DIAGNOSIS — H269 Unspecified cataract: Secondary | ICD-10-CM | POA: Diagnosis not present

## 2022-01-01 DIAGNOSIS — K5900 Constipation, unspecified: Secondary | ICD-10-CM | POA: Diagnosis not present

## 2022-01-01 DIAGNOSIS — E785 Hyperlipidemia, unspecified: Secondary | ICD-10-CM | POA: Diagnosis not present

## 2022-01-01 DIAGNOSIS — I10 Essential (primary) hypertension: Secondary | ICD-10-CM | POA: Diagnosis not present

## 2022-01-03 ENCOUNTER — Observation Stay (HOSPITAL_COMMUNITY)
Admission: EM | Admit: 2022-01-03 | Discharge: 2022-01-04 | Payer: Medicare PPO | Attending: Family Medicine | Admitting: Family Medicine

## 2022-01-03 ENCOUNTER — Emergency Department (HOSPITAL_COMMUNITY): Payer: Medicare PPO

## 2022-01-03 ENCOUNTER — Other Ambulatory Visit: Payer: Self-pay

## 2022-01-03 DIAGNOSIS — I480 Paroxysmal atrial fibrillation: Secondary | ICD-10-CM | POA: Insufficient documentation

## 2022-01-03 DIAGNOSIS — Z7984 Long term (current) use of oral hypoglycemic drugs: Secondary | ICD-10-CM | POA: Diagnosis not present

## 2022-01-03 DIAGNOSIS — R4701 Aphasia: Secondary | ICD-10-CM

## 2022-01-03 DIAGNOSIS — Z87891 Personal history of nicotine dependence: Secondary | ICD-10-CM | POA: Insufficient documentation

## 2022-01-03 DIAGNOSIS — I10 Essential (primary) hypertension: Secondary | ICD-10-CM | POA: Insufficient documentation

## 2022-01-03 DIAGNOSIS — J329 Chronic sinusitis, unspecified: Secondary | ICD-10-CM | POA: Diagnosis not present

## 2022-01-03 DIAGNOSIS — J45909 Unspecified asthma, uncomplicated: Secondary | ICD-10-CM | POA: Insufficient documentation

## 2022-01-03 DIAGNOSIS — R001 Bradycardia, unspecified: Secondary | ICD-10-CM | POA: Diagnosis not present

## 2022-01-03 DIAGNOSIS — Z7982 Long term (current) use of aspirin: Secondary | ICD-10-CM | POA: Diagnosis not present

## 2022-01-03 DIAGNOSIS — I517 Cardiomegaly: Secondary | ICD-10-CM | POA: Diagnosis not present

## 2022-01-03 DIAGNOSIS — I6782 Cerebral ischemia: Secondary | ICD-10-CM | POA: Diagnosis not present

## 2022-01-03 DIAGNOSIS — G319 Degenerative disease of nervous system, unspecified: Secondary | ICD-10-CM | POA: Diagnosis not present

## 2022-01-03 DIAGNOSIS — E876 Hypokalemia: Secondary | ICD-10-CM | POA: Diagnosis not present

## 2022-01-03 DIAGNOSIS — Z20822 Contact with and (suspected) exposure to covid-19: Secondary | ICD-10-CM | POA: Insufficient documentation

## 2022-01-03 DIAGNOSIS — R410 Disorientation, unspecified: Secondary | ICD-10-CM | POA: Diagnosis not present

## 2022-01-03 DIAGNOSIS — E11319 Type 2 diabetes mellitus with unspecified diabetic retinopathy without macular edema: Secondary | ICD-10-CM | POA: Insufficient documentation

## 2022-01-03 DIAGNOSIS — Z452 Encounter for adjustment and management of vascular access device: Secondary | ICD-10-CM | POA: Diagnosis not present

## 2022-01-03 DIAGNOSIS — G062 Extradural and subdural abscess, unspecified: Secondary | ICD-10-CM

## 2022-01-03 DIAGNOSIS — R4182 Altered mental status, unspecified: Secondary | ICD-10-CM | POA: Diagnosis not present

## 2022-01-03 DIAGNOSIS — R29818 Other symptoms and signs involving the nervous system: Secondary | ICD-10-CM | POA: Diagnosis not present

## 2022-01-03 DIAGNOSIS — Z7901 Long term (current) use of anticoagulants: Secondary | ICD-10-CM | POA: Diagnosis not present

## 2022-01-03 DIAGNOSIS — R29810 Facial weakness: Secondary | ICD-10-CM | POA: Diagnosis not present

## 2022-01-03 LAB — COMPREHENSIVE METABOLIC PANEL
ALT: 73 U/L — ABNORMAL HIGH (ref 0–44)
AST: 80 U/L — ABNORMAL HIGH (ref 15–41)
Albumin: 2.8 g/dL — ABNORMAL LOW (ref 3.5–5.0)
Alkaline Phosphatase: 76 U/L (ref 38–126)
Anion gap: 12 (ref 5–15)
BUN: <5 mg/dL — ABNORMAL LOW (ref 8–23)
CO2: 28 mmol/L (ref 22–32)
Calcium: 8.6 mg/dL — ABNORMAL LOW (ref 8.9–10.3)
Chloride: 98 mmol/L (ref 98–111)
Creatinine, Ser: 0.78 mg/dL (ref 0.44–1.00)
GFR, Estimated: >60 mL/min (ref >60)
Glucose, Bld: 107 mg/dL — ABNORMAL HIGH (ref 70–99)
Potassium: 3.3 mmol/L — ABNORMAL LOW (ref 3.5–5.1)
Sodium: 138 mmol/L (ref 135–145)
Total Bilirubin: 0.6 mg/dL (ref 0.3–1.2)
Total Protein: 7.1 g/dL (ref 6.5–8.1)

## 2022-01-03 LAB — CBC WITH DIFFERENTIAL/PLATELET
Abs Immature Granulocytes: 0.07 10*3/uL (ref 0.00–0.07)
Basophils Absolute: 0 10*3/uL (ref 0.0–0.1)
Basophils Relative: 0 %
Eosinophils Absolute: 0.1 10*3/uL (ref 0.0–0.5)
Eosinophils Relative: 1 %
HCT: 34 % — ABNORMAL LOW (ref 36.0–46.0)
Hemoglobin: 11.1 g/dL — ABNORMAL LOW (ref 12.0–15.0)
Immature Granulocytes: 1 %
Lymphocytes Relative: 18 %
Lymphs Abs: 1.4 10*3/uL (ref 0.7–4.0)
MCH: 30.8 pg (ref 26.0–34.0)
MCHC: 32.6 g/dL (ref 30.0–36.0)
MCV: 94.4 fL (ref 80.0–100.0)
Monocytes Absolute: 0.7 10*3/uL (ref 0.1–1.0)
Monocytes Relative: 10 %
Neutro Abs: 5.5 10*3/uL (ref 1.7–7.7)
Neutrophils Relative %: 70 %
Platelets: 389 10*3/uL (ref 150–400)
RBC: 3.6 MIL/uL — ABNORMAL LOW (ref 3.87–5.11)
RDW: 14.2 % (ref 11.5–15.5)
WBC: 7.8 10*3/uL (ref 4.0–10.5)
nRBC: 0 % (ref 0.0–0.2)

## 2022-01-03 LAB — LACTIC ACID, PLASMA: Lactic Acid, Venous: 1.2 mmol/L (ref 0.5–1.9)

## 2022-01-03 LAB — RESP PANEL BY RT-PCR (FLU A&B, COVID) ARPGX2
Influenza A by PCR: NEGATIVE
Influenza B by PCR: NEGATIVE
SARS Coronavirus 2 by RT PCR: NEGATIVE

## 2022-01-03 IMAGING — CT CT HEAD W/O CM
4 series · 15 of 47 positions shown, 17 images · non-contrast
Comparison: Head CT dated [DATE].

CLINICAL DATA: Neurologic deficit.



[Series 3: head without · axial · non-contrast · 0.46mm/px · z∈[-142,-22]mm · 7 of 34 slices shown, 9 images]
[im 5/34  brain]
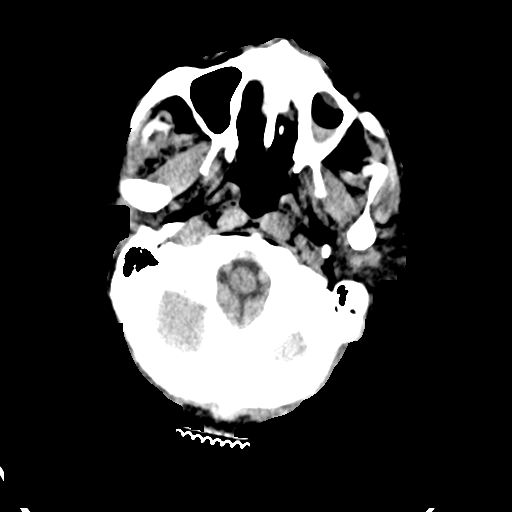
[im 5/34  bone]
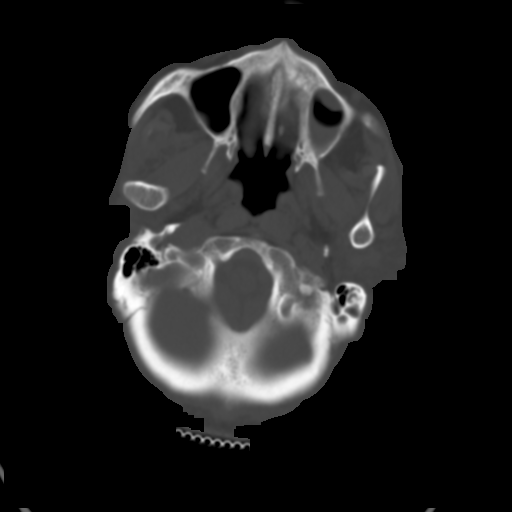
[im 9/34  brain]
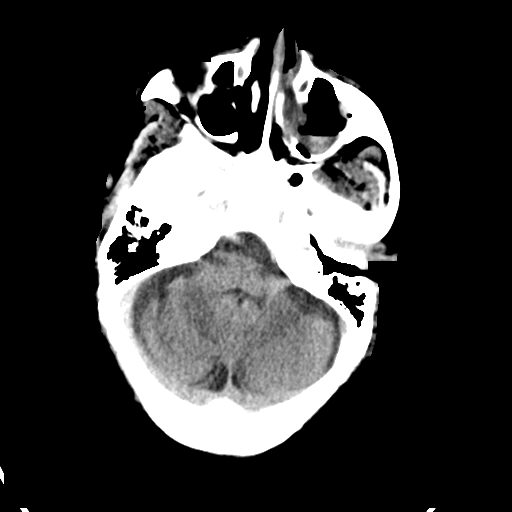
[im 13/34  brain]
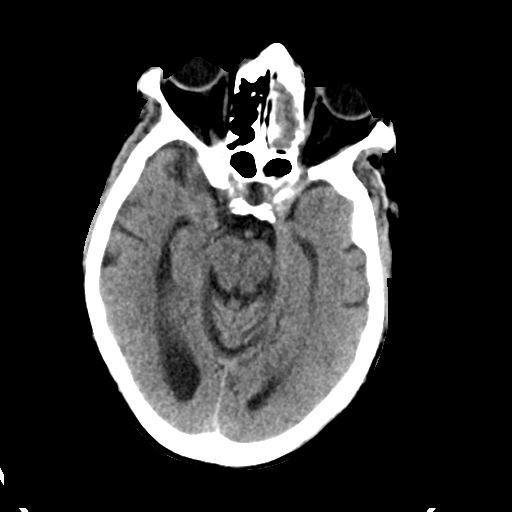
[im 17/34  brain]
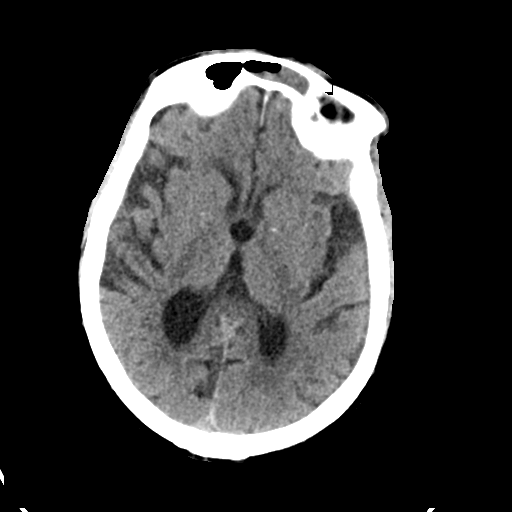
[im 21/34  brain]
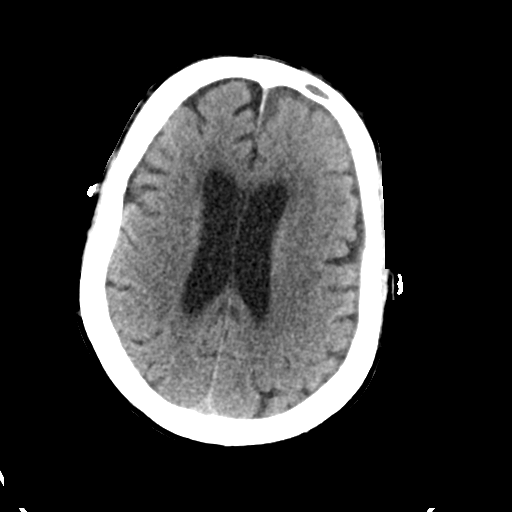
[im 21/34  bone]
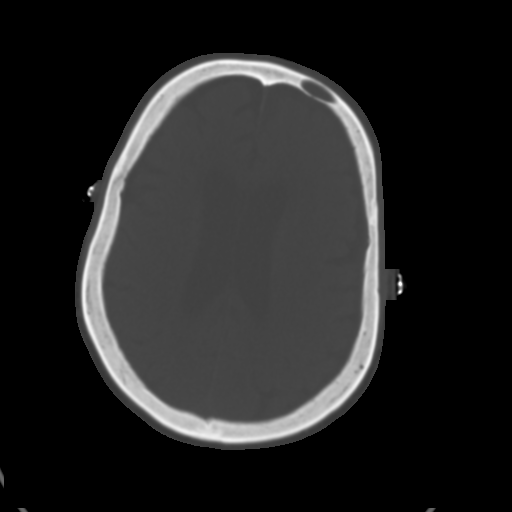
[im 25/34  brain]
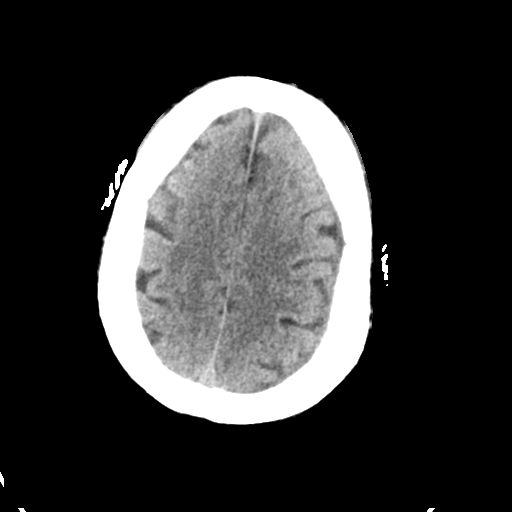
[im 29/34  brain]
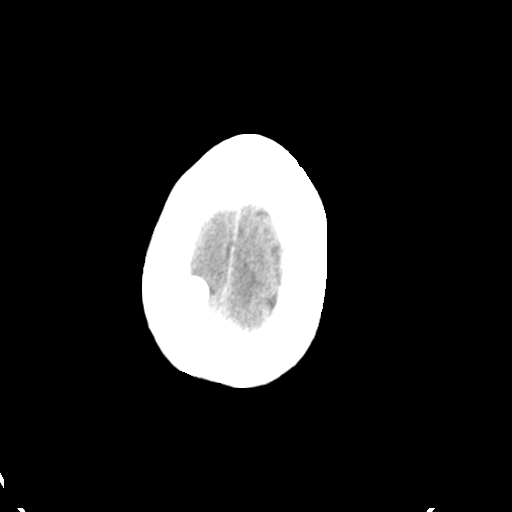

[Series 4: head bone · axial · 0.46mm/px · z∈[-146,-130]mm · 2 of 84 slices shown]
[im 9/84  bone]
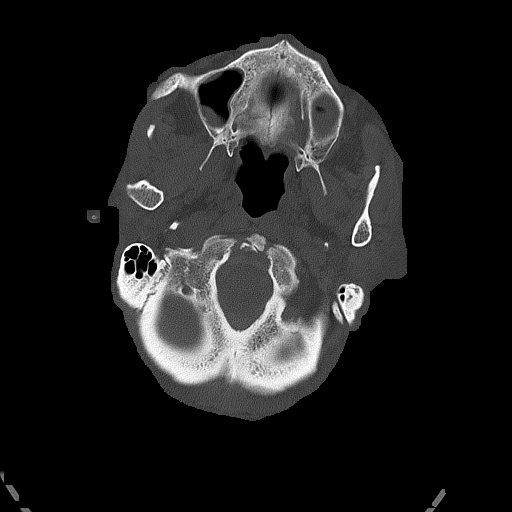
[im 17/84  bone]
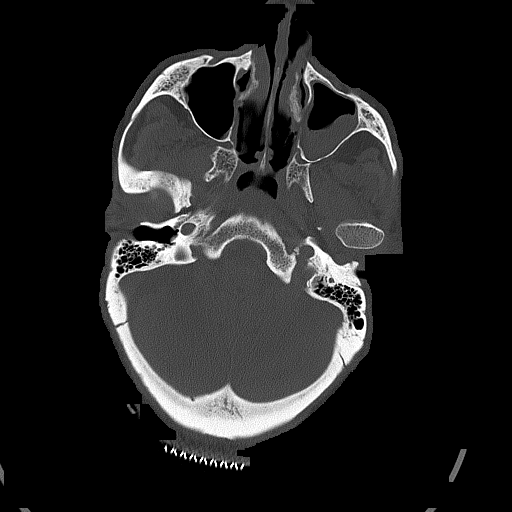

[Series 5: head without cor · coronal · non-contrast · 0.32mm/px · 3 of 71 slices shown]
[im 24/71  brain]
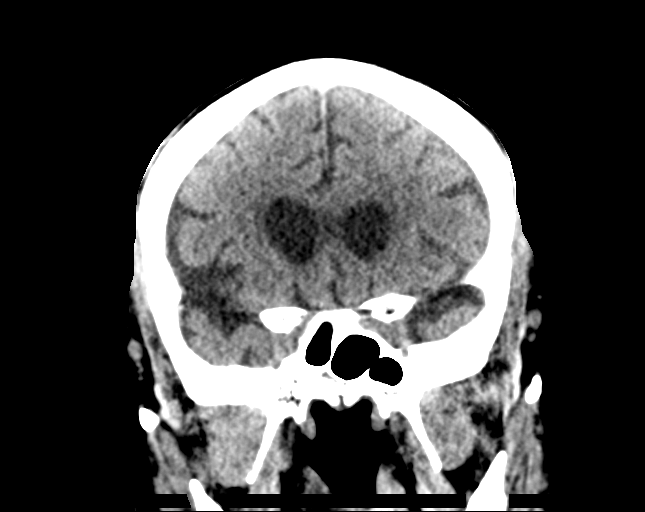
[im 32/71  brain]
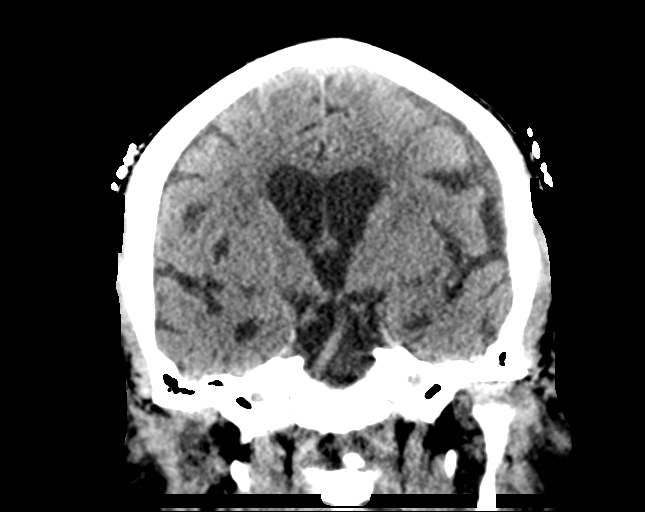
[im 39/71  brain]
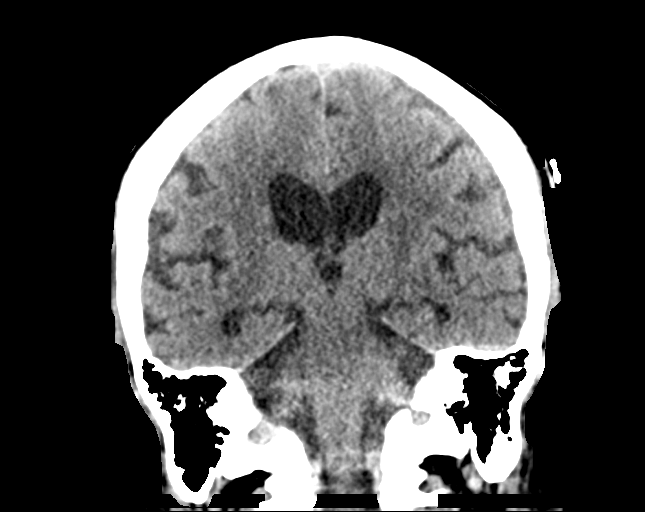

[Series 6: head without sag · sagittal · non-contrast · 0.34mm/px · 3 of 63 slices shown]
[im 21/63  brain]
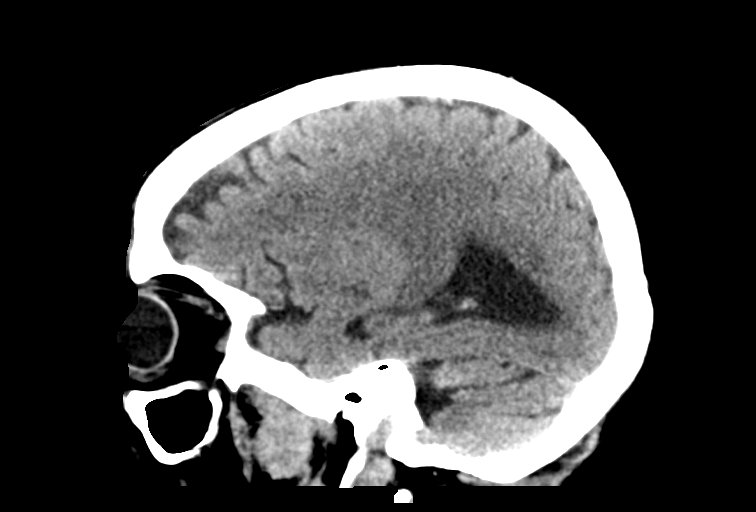
[im 32/63  brain]
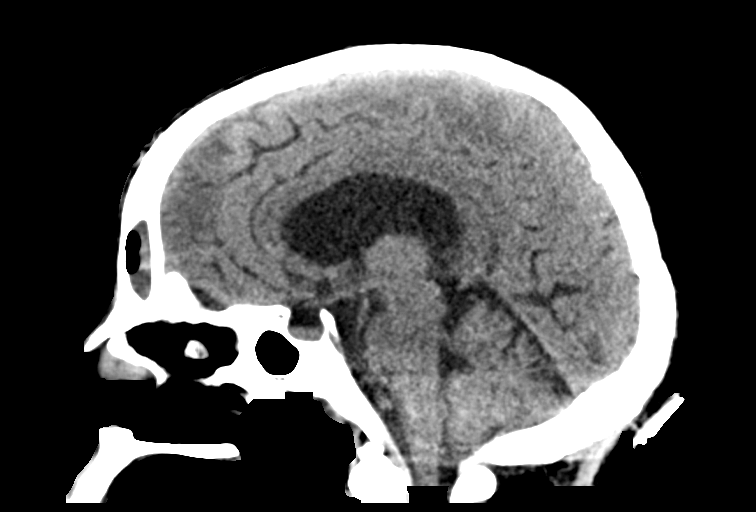
[im 42/63  brain]
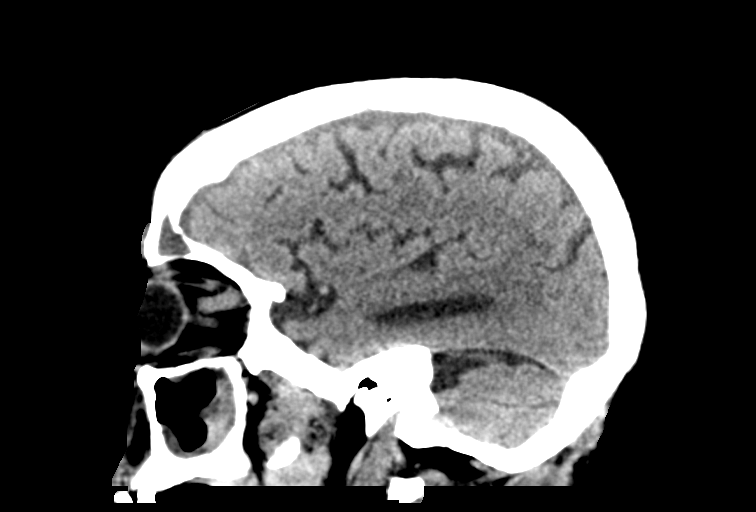

[15 of 47 positions shown; findings below may reference images not displayed]

FINDINGS: Brain: Mild age-related atrophy and chronic microvascular ischemic
changes. Small mixed density left subdural fluid measures up to 5 mm
in thickness and corresponds to the fluid seen on the prior MRI of
[DATE]. No acute or new intracranial hemorrhage. No mass effect
or midline shift. A 12 mm right parietal convexity calcified
meningioma.

Vascular: No hyperdense vessel or unexpected calcification.

Skull: Normal. Negative for fracture or focal lesion.

Sinuses/Orbits: There is complete opacification of the left ethmoid
air cells and frontal sinus as well as partial opacification of the
left maxillary sinus. The mastoid air cells are clear.

Other: None
IMPRESSION: 1. No acute intracranial hemorrhage.
2. Small mixed density left subdural fluid, similar to the prior MRI
of [DATE]. No mass effect or midline shift.
3. Mild age-related atrophy and chronic microvascular ischemic
changes.
4. A 12 mm right parietal convexity calcified meningioma.
5. Paranasal sinus disease.

## 2022-01-03 IMAGING — DX DG CHEST 2V
3 series · 3 of 3 positions shown · non-contrast
Comparison: Radiograph [DATE]

CLINICAL DATA: Confirm PICC placement.

EXAM:
CHEST - 2 VIEW

[chest pa]
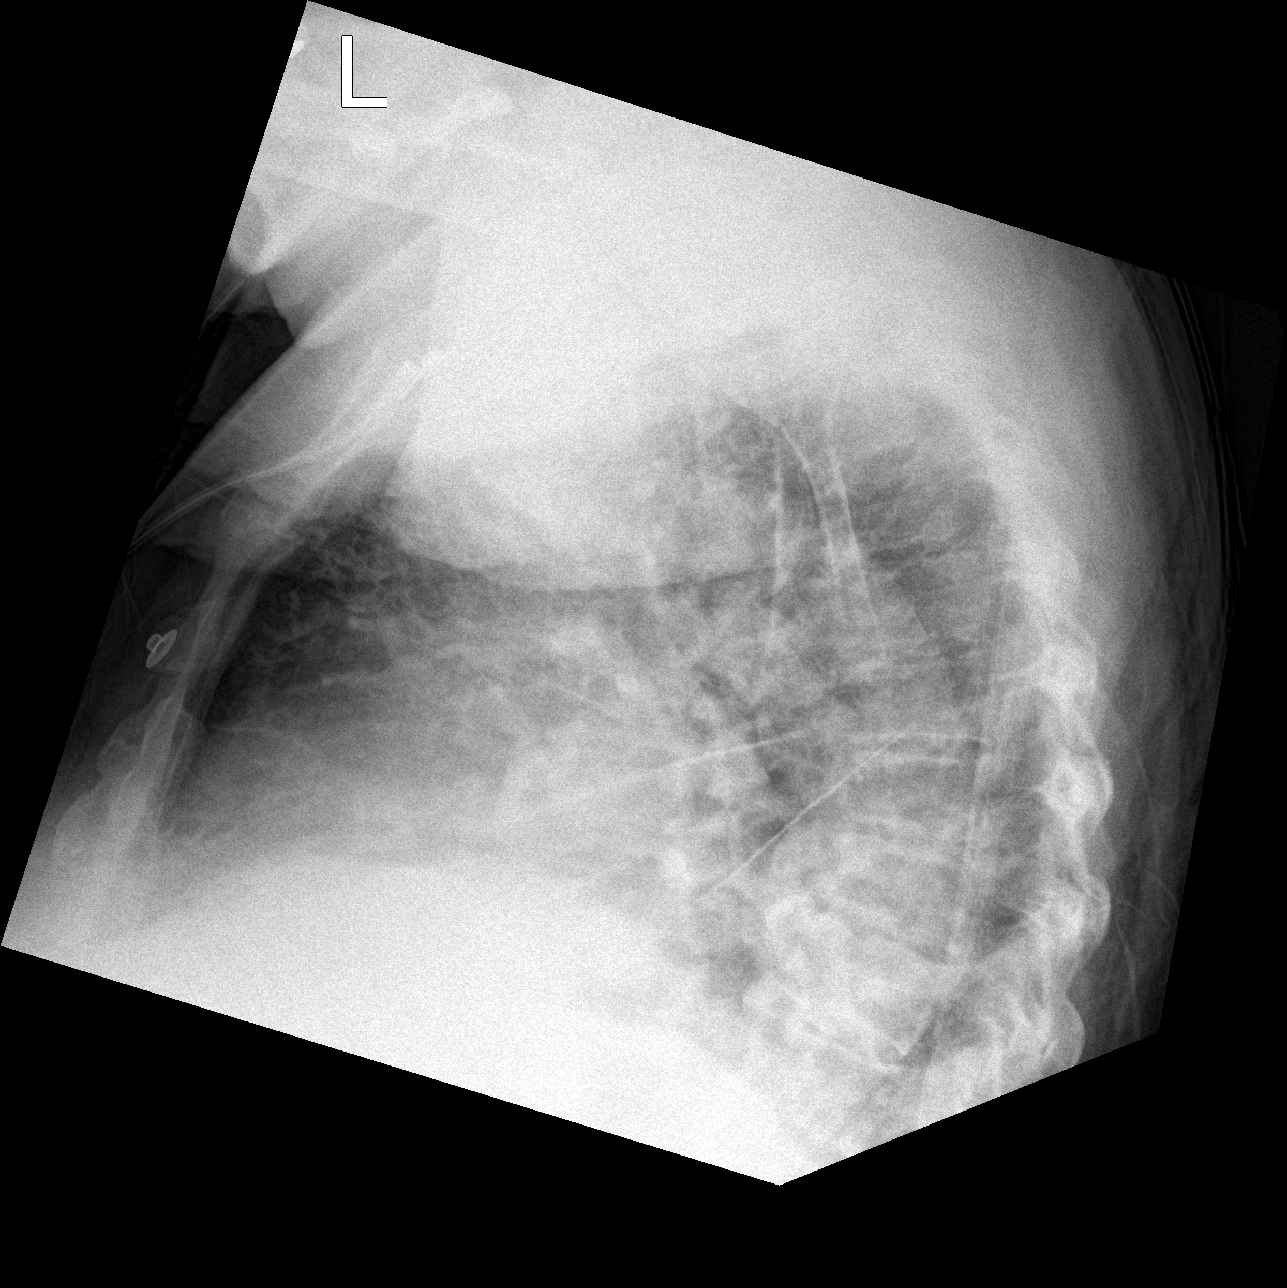

[chest lat]
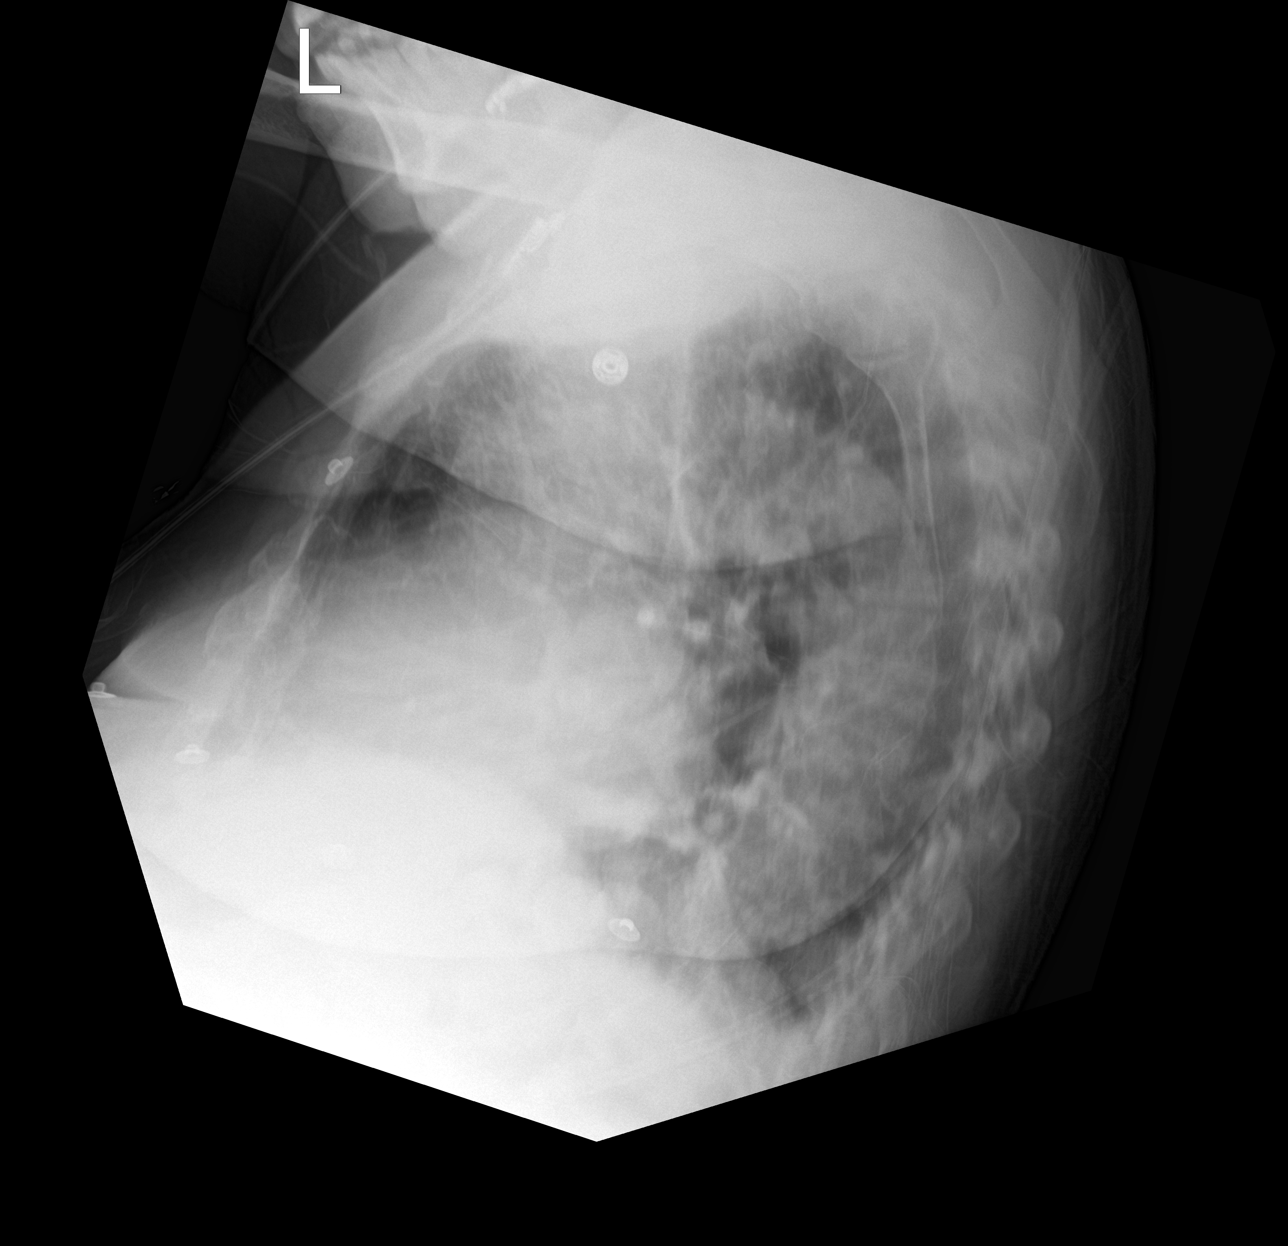

[chest ap]
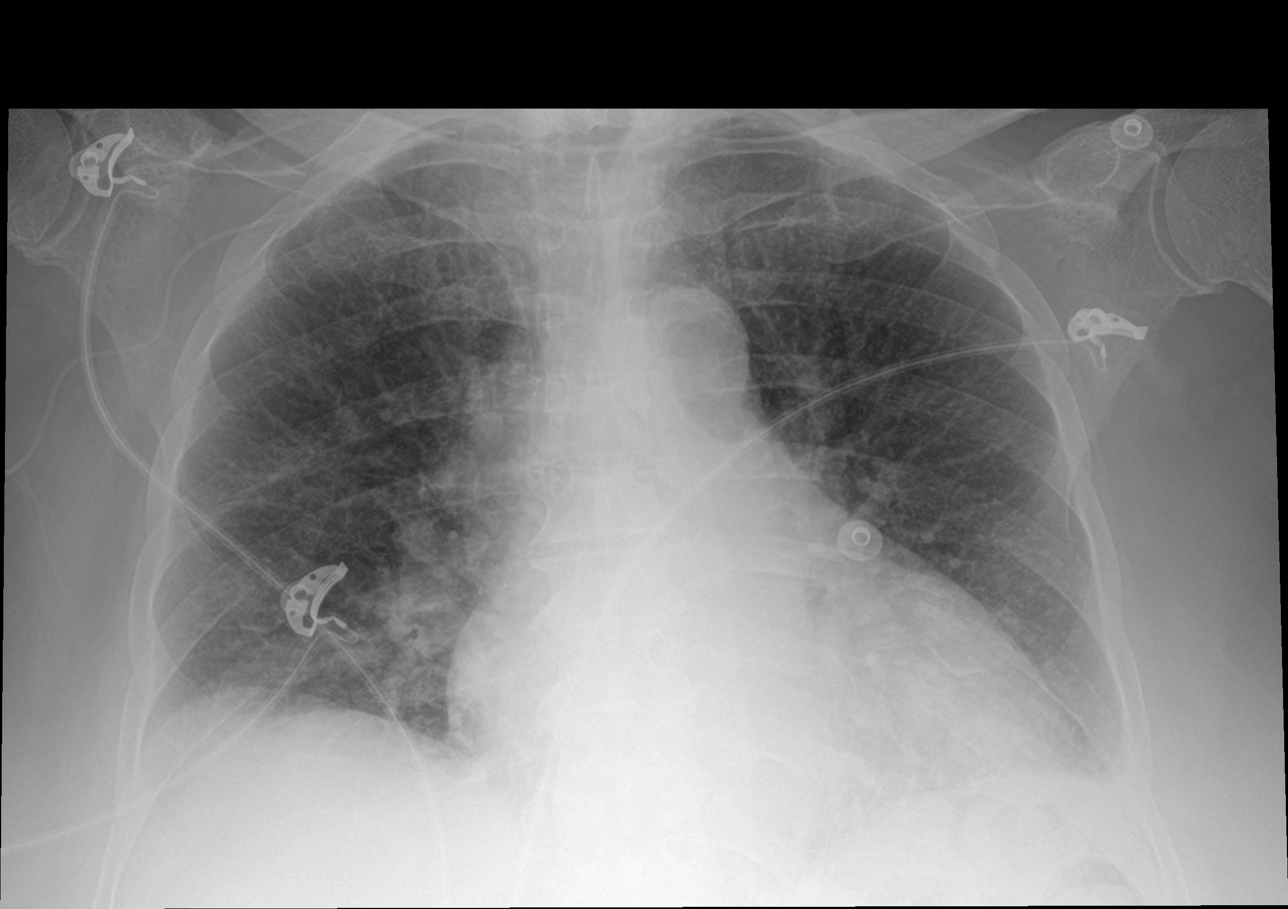

[3 of 3 positions shown; findings below may reference images not displayed]

FINDINGS: Right upper extremity PICC tip in the mid SVC. Mild cardiomegaly,
likely accentuated by AP technique. Mild vascular congestion.
Minimal fluid in the fissures with possible small subpulmonic
effusions. No confluent consolidation. No pneumothorax. No acute
osseous abnormalities are seen.
IMPRESSION: 1. Right upper extremity PICC tip in the mid SVC.
2. Mild cardiomegaly with vascular congestion, minimal fluid in the
fissures and possible small subpulmonic effusions.

## 2022-01-03 MED ORDER — SODIUM CHLORIDE 0.9 % IV SOLN
2.0000 g | Freq: Once | INTRAVENOUS | Status: AC
  Administered 2022-01-03: 2 g via INTRAVENOUS
  Filled 2022-01-03: qty 20

## 2022-01-03 NOTE — ED Notes (Signed)
Patient transported to CT 

## 2022-01-03 NOTE — ED Triage Notes (Signed)
Pt BIB EMS due to AMS. PT is usually axox4 and able to hold a conversation but today she just word vomits and starts to laugh. Pt was recently at [facility] and was diagnosed with a subdural abscess. VSS.

## 2022-01-03 NOTE — ED Notes (Signed)
Patient back from  X-ray 

## 2022-01-03 NOTE — ED Notes (Signed)
Patient transported to X-ray 

## 2022-01-04 ENCOUNTER — Encounter (HOSPITAL_COMMUNITY): Payer: Self-pay | Admitting: Family Medicine

## 2022-01-04 ENCOUNTER — Observation Stay (HOSPITAL_COMMUNITY): Payer: Medicare PPO

## 2022-01-04 DIAGNOSIS — E1165 Type 2 diabetes mellitus with hyperglycemia: Secondary | ICD-10-CM | POA: Diagnosis not present

## 2022-01-04 DIAGNOSIS — R4182 Altered mental status, unspecified: Secondary | ICD-10-CM | POA: Diagnosis present

## 2022-01-04 DIAGNOSIS — G062 Extradural and subdural abscess, unspecified: Secondary | ICD-10-CM | POA: Diagnosis present

## 2022-01-04 DIAGNOSIS — R1319 Other dysphagia: Secondary | ICD-10-CM | POA: Diagnosis not present

## 2022-01-04 DIAGNOSIS — E785 Hyperlipidemia, unspecified: Secondary | ICD-10-CM | POA: Diagnosis not present

## 2022-01-04 DIAGNOSIS — I10 Essential (primary) hypertension: Secondary | ICD-10-CM | POA: Diagnosis not present

## 2022-01-04 DIAGNOSIS — E871 Hypo-osmolality and hyponatremia: Secondary | ICD-10-CM | POA: Diagnosis not present

## 2022-01-04 DIAGNOSIS — K219 Gastro-esophageal reflux disease without esophagitis: Secondary | ICD-10-CM | POA: Diagnosis not present

## 2022-01-04 DIAGNOSIS — M6250 Muscle wasting and atrophy, not elsewhere classified, unspecified site: Secondary | ICD-10-CM | POA: Diagnosis not present

## 2022-01-04 DIAGNOSIS — M6281 Muscle weakness (generalized): Secondary | ICD-10-CM | POA: Diagnosis not present

## 2022-01-04 DIAGNOSIS — Z87891 Personal history of nicotine dependence: Secondary | ICD-10-CM | POA: Diagnosis not present

## 2022-01-04 DIAGNOSIS — R531 Weakness: Secondary | ICD-10-CM | POA: Diagnosis not present

## 2022-01-04 DIAGNOSIS — G9340 Encephalopathy, unspecified: Secondary | ICD-10-CM | POA: Diagnosis not present

## 2022-01-04 DIAGNOSIS — Z7982 Long term (current) use of aspirin: Secondary | ICD-10-CM | POA: Diagnosis not present

## 2022-01-04 DIAGNOSIS — G9389 Other specified disorders of brain: Secondary | ICD-10-CM | POA: Diagnosis not present

## 2022-01-04 DIAGNOSIS — Z7901 Long term (current) use of anticoagulants: Secondary | ICD-10-CM | POA: Diagnosis not present

## 2022-01-04 DIAGNOSIS — J9600 Acute respiratory failure, unspecified whether with hypoxia or hypercapnia: Secondary | ICD-10-CM | POA: Diagnosis not present

## 2022-01-04 DIAGNOSIS — E11649 Type 2 diabetes mellitus with hypoglycemia without coma: Secondary | ICD-10-CM | POA: Diagnosis not present

## 2022-01-04 DIAGNOSIS — Z8661 Personal history of infections of the central nervous system: Secondary | ICD-10-CM | POA: Diagnosis not present

## 2022-01-04 DIAGNOSIS — R4701 Aphasia: Secondary | ICD-10-CM | POA: Diagnosis not present

## 2022-01-04 DIAGNOSIS — G9341 Metabolic encephalopathy: Secondary | ICD-10-CM | POA: Diagnosis not present

## 2022-01-04 DIAGNOSIS — R200 Anesthesia of skin: Secondary | ICD-10-CM | POA: Diagnosis not present

## 2022-01-04 DIAGNOSIS — B370 Candidal stomatitis: Secondary | ICD-10-CM | POA: Diagnosis not present

## 2022-01-04 DIAGNOSIS — I480 Paroxysmal atrial fibrillation: Secondary | ICD-10-CM | POA: Diagnosis not present

## 2022-01-04 DIAGNOSIS — Z531 Procedure and treatment not carried out because of patient's decision for reasons of belief and group pressure: Secondary | ICD-10-CM | POA: Diagnosis not present

## 2022-01-04 DIAGNOSIS — R930 Abnormal findings on diagnostic imaging of skull and head, not elsewhere classified: Secondary | ICD-10-CM | POA: Diagnosis not present

## 2022-01-04 DIAGNOSIS — E11319 Type 2 diabetes mellitus with unspecified diabetic retinopathy without macular edema: Secondary | ICD-10-CM | POA: Diagnosis not present

## 2022-01-04 DIAGNOSIS — R41841 Cognitive communication deficit: Secondary | ICD-10-CM | POA: Diagnosis not present

## 2022-01-04 DIAGNOSIS — H409 Unspecified glaucoma: Secondary | ICD-10-CM | POA: Diagnosis not present

## 2022-01-04 DIAGNOSIS — E119 Type 2 diabetes mellitus without complications: Secondary | ICD-10-CM | POA: Diagnosis not present

## 2022-01-04 DIAGNOSIS — E876 Hypokalemia: Secondary | ICD-10-CM | POA: Diagnosis not present

## 2022-01-04 DIAGNOSIS — J9601 Acute respiratory failure with hypoxia: Secondary | ICD-10-CM | POA: Diagnosis not present

## 2022-01-04 DIAGNOSIS — Z4682 Encounter for fitting and adjustment of non-vascular catheter: Secondary | ICD-10-CM | POA: Diagnosis not present

## 2022-01-04 DIAGNOSIS — Z7984 Long term (current) use of oral hypoglycemic drugs: Secondary | ICD-10-CM | POA: Diagnosis not present

## 2022-01-04 DIAGNOSIS — R278 Other lack of coordination: Secondary | ICD-10-CM | POA: Diagnosis not present

## 2022-01-04 DIAGNOSIS — J9811 Atelectasis: Secondary | ICD-10-CM | POA: Diagnosis not present

## 2022-01-04 DIAGNOSIS — R569 Unspecified convulsions: Secondary | ICD-10-CM | POA: Diagnosis not present

## 2022-01-04 DIAGNOSIS — Z20822 Contact with and (suspected) exposure to covid-19: Secondary | ICD-10-CM | POA: Diagnosis not present

## 2022-01-04 DIAGNOSIS — J45909 Unspecified asthma, uncomplicated: Secondary | ICD-10-CM | POA: Diagnosis not present

## 2022-01-04 DIAGNOSIS — J0111 Acute recurrent frontal sinusitis: Secondary | ICD-10-CM | POA: Diagnosis not present

## 2022-01-04 DIAGNOSIS — Z794 Long term (current) use of insulin: Secondary | ICD-10-CM | POA: Diagnosis not present

## 2022-01-04 DIAGNOSIS — K439 Ventral hernia without obstruction or gangrene: Secondary | ICD-10-CM | POA: Diagnosis not present

## 2022-01-04 DIAGNOSIS — I639 Cerebral infarction, unspecified: Secondary | ICD-10-CM | POA: Diagnosis not present

## 2022-01-04 DIAGNOSIS — R401 Stupor: Secondary | ICD-10-CM | POA: Diagnosis not present

## 2022-01-04 DIAGNOSIS — G932 Benign intracranial hypertension: Secondary | ICD-10-CM | POA: Diagnosis not present

## 2022-01-04 DIAGNOSIS — R1312 Dysphagia, oropharyngeal phase: Secondary | ICD-10-CM | POA: Diagnosis not present

## 2022-01-04 DIAGNOSIS — R1310 Dysphagia, unspecified: Secondary | ICD-10-CM | POA: Diagnosis not present

## 2022-01-04 DIAGNOSIS — J329 Chronic sinusitis, unspecified: Secondary | ICD-10-CM | POA: Diagnosis not present

## 2022-01-04 LAB — VANCOMYCIN, TROUGH: Vancomycin Tr: 15 ug/mL (ref 15–20)

## 2022-01-04 LAB — BASIC METABOLIC PANEL
Anion gap: 11 (ref 5–15)
BUN: <5 mg/dL — ABNORMAL LOW (ref 8–23)
CO2: 28 mmol/L (ref 22–32)
Calcium: 8.3 mg/dL — ABNORMAL LOW (ref 8.9–10.3)
Chloride: 98 mmol/L (ref 98–111)
Creatinine, Ser: 0.66 mg/dL (ref 0.44–1.00)
GFR, Estimated: >60 mL/min (ref >60)
Glucose, Bld: 99 mg/dL (ref 70–99)
Potassium: 3 mmol/L — ABNORMAL LOW (ref 3.5–5.1)
Sodium: 137 mmol/L (ref 135–145)

## 2022-01-04 LAB — URINALYSIS, ROUTINE W REFLEX MICROSCOPIC
Bilirubin Urine: NEGATIVE
Glucose, UA: NEGATIVE mg/dL
Hgb urine dipstick: NEGATIVE
Ketones, ur: 20 mg/dL — AB
Leukocytes,Ua: NEGATIVE
Nitrite: NEGATIVE
Protein, ur: NEGATIVE mg/dL
Specific Gravity, Urine: 1.01 (ref 1.005–1.030)
pH: 8 (ref 5.0–8.0)

## 2022-01-04 IMAGING — MR MR HEAD WO/W CM
15 of 17 series · 42 of 48 positions shown · IV contrast (gadavist)
Comparison: Head CT from yesterday.  Brain MRI [DATE]

CLINICAL DATA: Altered mental status

EXAM:
MRI HEAD WITHOUT AND WITH CONTRAST
TECHNIQUE: Multiplanar, multiecho pulse sequences of the brain and surrounding
structures were obtained without and with intravenous contrast.
CONTRAST:  7mL GADAVIST GADOBUTROL 1 MMOL/ML IV SOLN

[Series 5: DWI · axial · 3.0mm · 0.92mm/px · z∈[-89,+74]mm · 6 of 111 slices shown (1 of 4)]
[im 1/111]
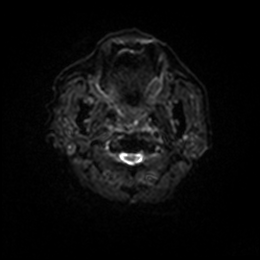
[im 23/111]
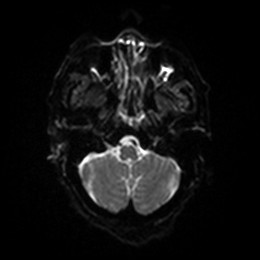
[im 45/111]
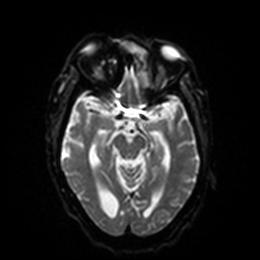
[im 67/111]
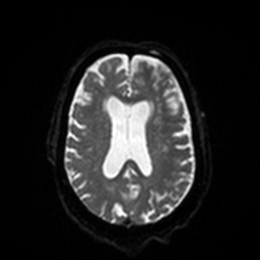
[im 89/111]
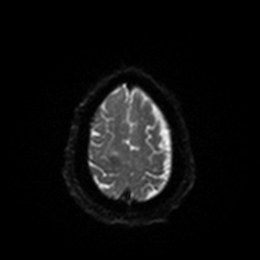
[im 111/111]
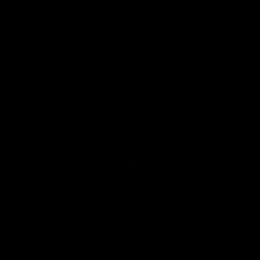

[Series 6: DWI · axial · 3.0mm · 0.92mm/px · z∈[-89,+71]mm · 3 of 55 slices shown (2 of 4)]
[im 1/55]
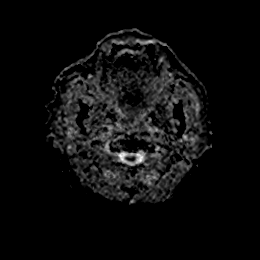
[im 28/55]
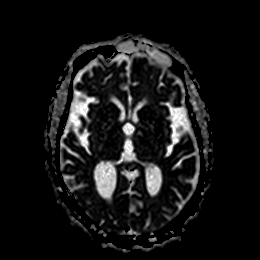
[im 55/55]
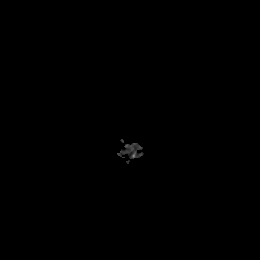

[Series 7: DWI · coronal · 4.0mm · 0.88mm/px · 5 of 82 slices shown (3 of 4)]
[im 1/82]
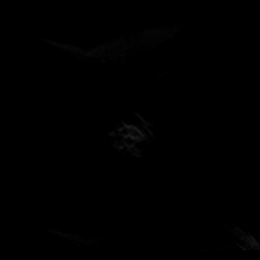
[im 21/82]
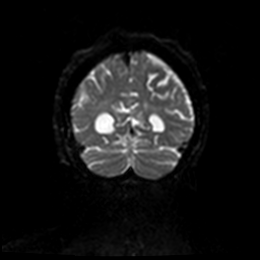
[im 41/82]
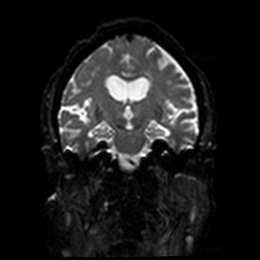
[im 61/82]
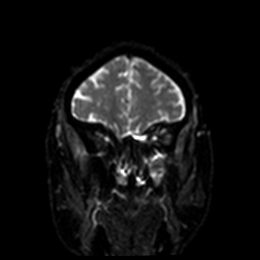
[im 82/82]
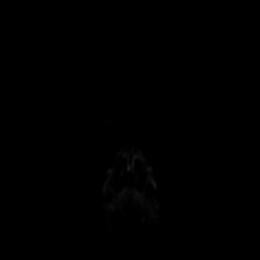

[Series 8: DWI · coronal · 4.0mm · 0.88mm/px · 2 of 41 slices shown (4 of 4)]
[im 1/41]
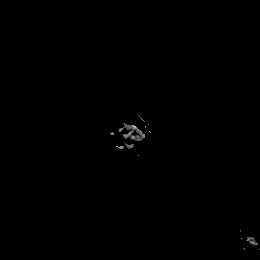
[im 41/41]
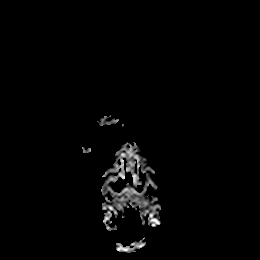

[Series 9: T2 · axial · 5.0mm · 0.75mm/px · z∈[-99,+68]mm · 2 of 29 slices shown]
[im 1/29]
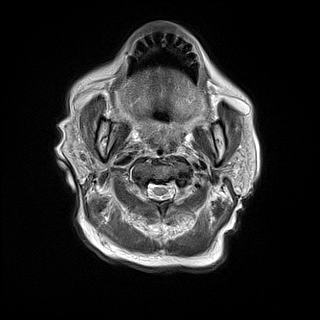
[im 29/29]
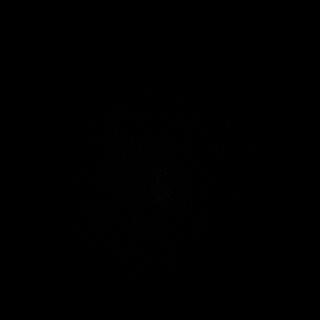

[Series 10: T1 · sagittal · 5.0mm · 0.94mm/px · 2 of 27 slices shown]
[im 1/27]
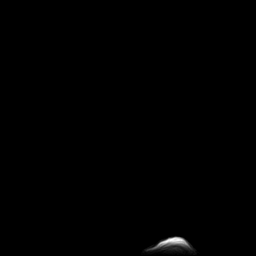
[im 27/27]
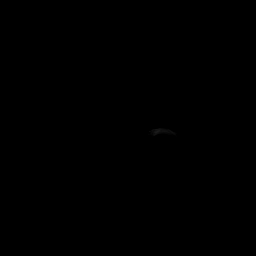

[Series 11: FLAIR · axial · 5.0mm · 0.94mm/px · z∈[-97,+70]mm · 2 of 29 slices shown]
[im 1/29]
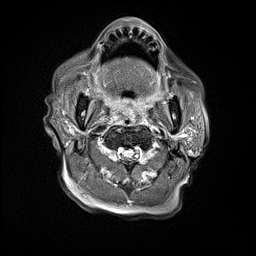
[im 29/29]
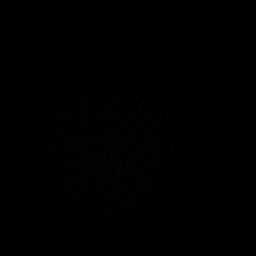

[Series 12: mag_images · axial · 3.0mm · 0.94mm/px · z∈[-87,+77]mm · 3 of 56 slices shown]
[im 1/56]
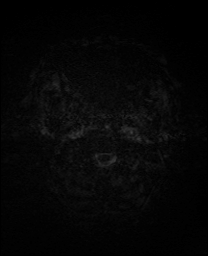
[im 28/56]
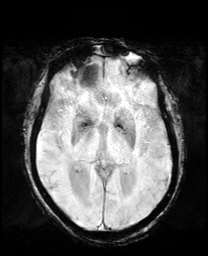
[im 56/56]
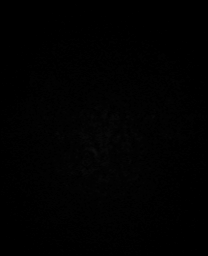

[Series 13: pha_images · axial · 3.0mm · 0.94mm/px · z∈[-87,+74]mm · 3 of 55 slices shown]
[im 1/55]
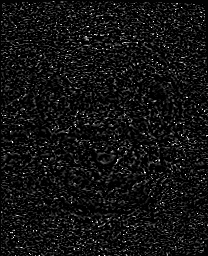
[im 28/55]
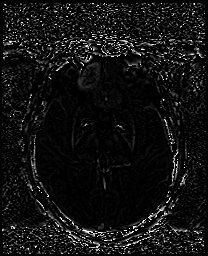
[im 55/55]
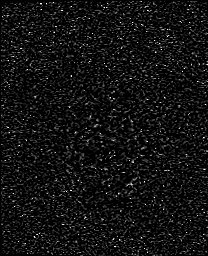

[Series 14: swi_images · axial · 3.0mm · 0.94mm/px · z∈[-87,+77]mm · 3 of 56 slices shown]
[im 1/56]
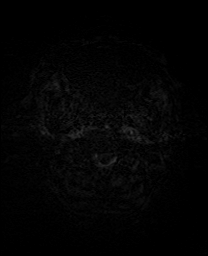
[im 28/56]
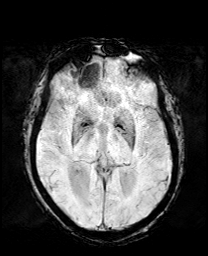
[im 56/56]
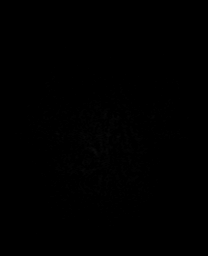

[Series 15: mip_images(sw) · axial · 24.0mm · 0.94mm/px · z∈[-76,+66]mm · 3 of 49 slices shown]
[im 1/49]
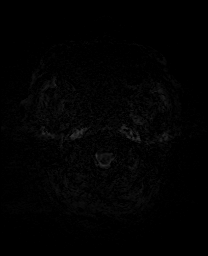
[im 25/49]
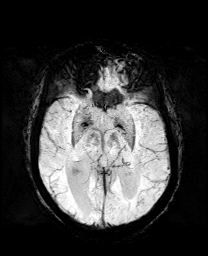
[im 49/49]
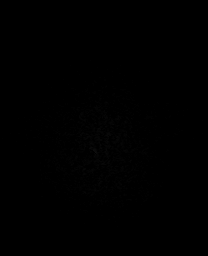

[Series 17: T2 post-contrast · coronal · 5.0mm · 0.72mm/px · 2 of 35 slices shown]
[im 1/35]
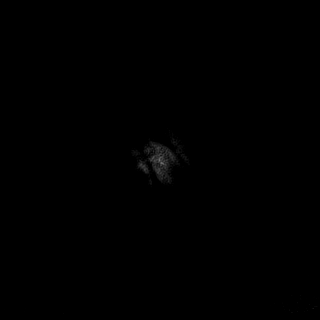
[im 35/35]
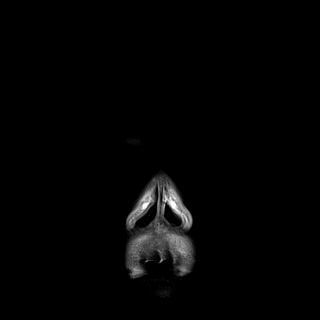

[Series 19: T1 post-contrast · coronal · 5.0mm · 0.34mm/px · 2 of 35 slices shown (1 of 3)]
[im 1/35]
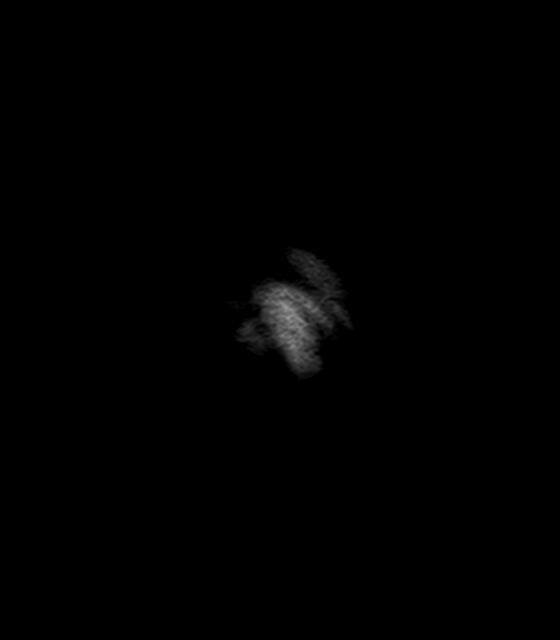
[im 35/35]
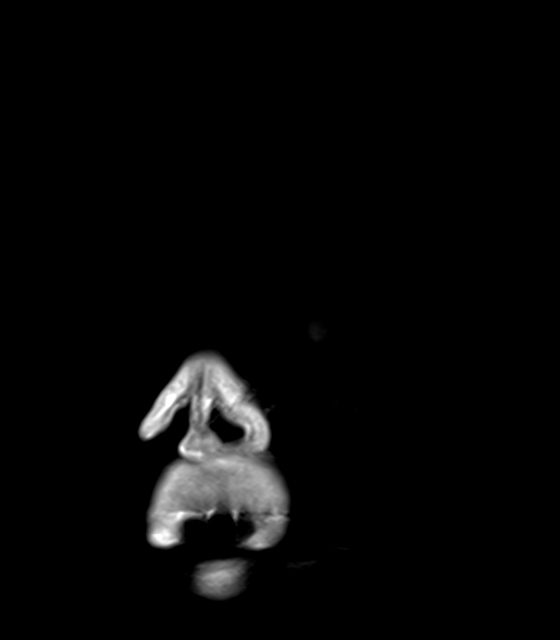

[Series 20: T1 post-contrast · sagittal · 5.0mm · 0.75mm/px · 2 of 28 slices shown (2 of 3)]
[im 1/28]
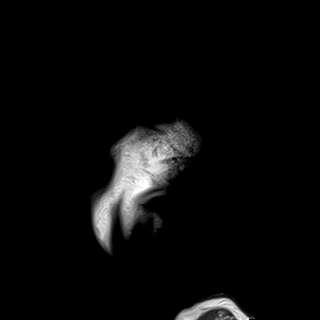
[im 28/28]
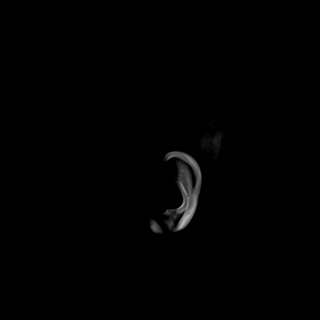

[Series 21: T1 post-contrast · coronal · 5.0mm · 0.34mm/px · 2 of 35 slices shown (3 of 3)]
[im 1/35]
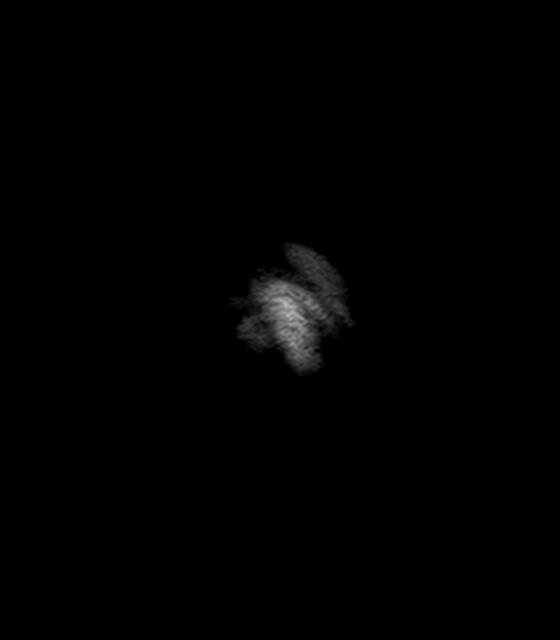
[im 35/35]
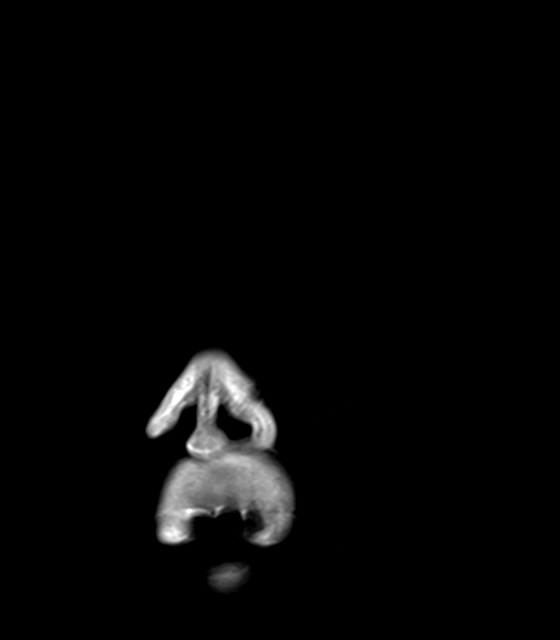

[42 of 48 positions shown; findings below may reference images not displayed]

FINDINGS: Brain: Known subdural empyema on the left encompassing the anterior
frontal lobe. Collection is slightly less thick than before at 6 mm
compared to 8 mm maximum. The extent is similar to before. Diffuse
dural thickening along the left cerebral convexity with small volume
subdural CSF intensity accumulation. Leptomeningeal thickening and
enhancement in the region of the purulence.

No complicating infarct or ventriculitis.

Densely calcified, peripherally enhancing superior right frontal
meningioma measuring 15 mm

Vascular: Major vessels are enhancing. Engorged appearance of
superior ophthalmic veins without thrombus.

Skull and upper cervical spine: No focal marrow lesion

Sinuses/Orbits: Despite interval endoscopic sinus surgery there is
opacified left ethmoid and frontal sinuses with peripheral
enhancement and known posterior wall dehiscence on the left.
IMPRESSION: 1. Known anterior left frontal subdural empyema related to
complicated left frontal sinusitis. The collection is mildly
decreased from [DATE], but still measures up to 6 mm in
thickness with regional meningitis.
2. Despite interval endoscopic sinus surgery there is continued
inflammation and complete opacification of left ethmoid and frontal
sinuses.

## 2022-01-04 MED ORDER — VANCOMYCIN HCL 1750 MG/350ML IV SOLN
1750.0000 mg | INTRAVENOUS | Status: DC
  Administered 2022-01-04: 1750 mg via INTRAVENOUS
  Filled 2022-01-04: qty 350

## 2022-01-04 MED ORDER — CHLORHEXIDINE GLUCONATE CLOTH 2 % EX PADS: 6.0000 | MEDICATED_PAD | Freq: Every day | CUTANEOUS | Status: DC

## 2022-01-04 MED ORDER — METRONIDAZOLE 500 MG/100ML IV SOLN
500.0000 mg | Freq: Three times a day (TID) | INTRAVENOUS | Status: DC
Start: 2022-01-04 — End: 2022-01-04
  Administered 2022-01-04 (×2): 500 mg via INTRAVENOUS
  Filled 2022-01-04 (×2): qty 100

## 2022-01-04 MED ORDER — POTASSIUM CHLORIDE 10 MEQ/100ML IV SOLN
10.0000 meq | INTRAVENOUS | Status: AC
  Administered 2022-01-04 (×3): 10 meq via INTRAVENOUS
  Filled 2022-01-04 (×4): qty 100

## 2022-01-04 MED ORDER — LEVETIRACETAM IN NACL 1000 MG/100ML IV SOLN
1000.0000 mg | Freq: Once | INTRAVENOUS | Status: AC
Start: 2022-01-04 — End: 2022-01-04
  Administered 2022-01-04: 1000 mg via INTRAVENOUS
  Filled 2022-01-04: qty 100

## 2022-01-04 MED ORDER — POTASSIUM CHLORIDE 10 MEQ/100ML IV SOLN
10.0000 meq | INTRAVENOUS | Status: AC
  Administered 2022-01-04 (×4): 10 meq via INTRAVENOUS
  Filled 2022-01-04 (×4): qty 100

## 2022-01-04 MED ORDER — METRONIDAZOLE 500 MG PO TABS: 500.0000 mg | ORAL_TABLET | Freq: Two times a day (BID) | ORAL | Status: DC

## 2022-01-04 MED ORDER — GADOBUTROL 1 MMOL/ML IV SOLN
7.0000 mL | Freq: Once | INTRAVENOUS | Status: AC | PRN
  Administered 2022-01-04: 7 mL via INTRAVENOUS

## 2022-01-04 MED ORDER — SODIUM CHLORIDE 0.9% FLUSH: 10.0000 mL | Freq: Two times a day (BID) | INTRAVENOUS | Status: DC

## 2022-01-04 MED ORDER — ACETAMINOPHEN 325 MG PO TABS: 650.0000 mg | ORAL_TABLET | Freq: Four times a day (QID) | ORAL | Status: DC | PRN

## 2022-01-04 MED ORDER — METRONIDAZOLE 500 MG PO TABS: 500.0000 mg | ORAL_TABLET | Freq: Three times a day (TID) | ORAL | Status: DC

## 2022-01-04 MED ORDER — ACETAMINOPHEN 650 MG RE SUPP: 650.0000 mg | Freq: Four times a day (QID) | RECTAL | Status: DC | PRN

## 2022-01-04 MED ORDER — LORAZEPAM 2 MG/ML IJ SOLN
0.5000 mg | Freq: Once | INTRAMUSCULAR | Status: AC
  Administered 2022-01-04: 0.5 mg via INTRAVENOUS
  Filled 2022-01-04: qty 1

## 2022-01-04 MED ORDER — LEVETIRACETAM IN NACL 500 MG/100ML IV SOLN
500.0000 mg | Freq: Two times a day (BID) | INTRAVENOUS | Status: DC
  Administered 2022-01-04: 500 mg via INTRAVENOUS
  Filled 2022-01-04 (×2): qty 100

## 2022-01-04 MED ORDER — SODIUM CHLORIDE 0.9 % IV SOLN
2.0000 g | Freq: Two times a day (BID) | INTRAVENOUS | Status: DC
  Administered 2022-01-04: 2 g via INTRAVENOUS
  Filled 2022-01-04: qty 20

## 2022-01-04 NOTE — ED Notes (Signed)
Pt extremely restless trying to get out of bed, Spoke with PA Claiborne Billings about it 0.5 mg ativan ordered and administered per [facility]

## 2022-01-04 NOTE — ED Notes (Signed)
Admitting providers at bedside

## 2022-01-04 NOTE — ED Notes (Signed)
Safety mitts applied to pt to prevent removal of equipment and to maintain pt safety.

## 2022-01-04 NOTE — ED Notes (Signed)
Pt cleaned up and bedding replaced.

## 2022-01-04 NOTE — ED Notes (Signed)
Pt restless in bed 

## 2022-01-04 NOTE — ED Notes (Signed)
Notified xray of powershare needed

## 2022-01-04 NOTE — ED Notes (Signed)
Received phone call from [place] from [place] transfer center. Pt has a room assigned. Notified attending.

## 2022-01-04 NOTE — Progress Notes (Signed)
Pt. In ED with existing PICC. Placement verified by Xray. Dressing changed with assistance. Catheter at 0, stabilized, dressing changed. Routine line care orders entered.

## 2022-01-04 NOTE — ED Notes (Signed)
Family medicine provider at bedside.

## 2022-01-04 NOTE — Progress Notes (Signed)
Family Medicine Teaching Service Daily Progress Note Intern Pager: [phone]  Patient name: Chelsea Gallegos Medical record number: [id number] Date of birth: 02/09/1934 Age: 88 y.o. Gender: female  Primary Care Provider: [place] Consultants: Neurosurgery  Code Status: DNR  Pt Overview and Major Events to Date:  01/04/22 - patient admited  Assessment and Plan:  Altered mental status Wanting antibiotics started. Patient had ENT surgical procedure prior to SNF, and was found to be AMS in SNF. Patient showing no systemic signs of infection. Patient brain MRI looks similar to pre-procedure MRI showing subdural empyema and inflamation/opacification of ethmoid and frontal sinuses. AMS may be 2/2 subdural empyema. [facility] neuro surgery will take patient when they have room, accepting physician Dr. Azalia Bilis.  -F/u [facility] -IV Flagyl, CFTX, Vanc -Keppra 500 mg BID -F/u blood cultures -Routine vitals  HTN Home med Cardizem. BP range 120-170's/60-110's -Continue to monitor  PAF -Consider starting Eliquis 5 mg daily, may hold in setting of surgery soon. Will reach out to Neurosurgery.  -Continuous cardiac monitoring  DM2 Cbg range 99-107, stable continue to monitor -SSI -CBG qac & qhs  Hyopkalemia 3.3 on admission, repleted, K 3.0 this am.  -Continue to monitor  HLD Atorvastatin 40 mg daily  History of EToH Last drink unknown, will monitor  FEN/GI: NPO PPx: SCDs Dispo: Transfer to [facility] Neurosurgery   Pending room on their service .  Subjective:  Patient altered and non-responsive today, reaching and grabbing at blankets/clothes, trying to get out of bed. Patient was placed in 2 point restraints.  Objective: Temp:  [97.7 F (36.5 C)-98.2 F (36.8 C)] 98.2 F (36.8 C) (01/27 0732) Pulse Rate:  [68-98] 87 (01/27 0800) Resp:  [14-28] 18 (01/27 0800) BP: (124-170)/(65-116) 145/98 (01/27 0800) SpO2:  [93 %-100 %] 95 % (01/27 0800) Weight:  [81.2 kg]  81.2 kg (01/27 0735) Physical Exam: General: Altered, non-responsive, active, climbing out of bed Cardiovascular: RRR, NRMG Respiratory: CTABL Abdomen: Soft, NTTP, non-distended Extremities: Moving all extremities independently, no edema  Laboratory: Recent Labs  Lab 01/03/22 2026  WBC 7.8  HGB 11.1*  HCT 34.0*  PLT 389   Recent Labs  Lab 01/03/22 2026  NA 138  K 3.3*  CL 98  CO2 28  BUN <5*  CREATININE 0.78  CALCIUM 8.6*  PROT 7.1  BILITOT 0.6  ALKPHOS 76  ALT 73*  AST 80*  GLUCOSE 107*      Imaging/Diagnostic Tests:   Holley Bouche, MD 01/04/2022, 9:12 AM PGY-1, [place] Intern pager: [phone], text pages welcome

## 2022-01-04 NOTE — H&P (Addendum)
[facility] Admission History and Physical Service Pager: [phone]  Patient name: Chelsea Gallegos Medical record number: [id number] Date of birth: 1934/05/06 Age: 88 y.o. Gender: female  Primary Care Provider: [facility] Consultants: None Code Status: DNR  Preferred Emergency Contact:  Contact Information     Name Relation Home Work [place] Daughter [phone]  [phone]        Chief Complaint: Altered mental status  Assessment and Plan: Chelsea Gallegos is a 88 y.o. female presenting with a altered mental status. PMH is significant for PAF, T2DM, HTN, HLD, GOUT   Altered mental status Patient presented to the ED due to altered mental status that was noticed by daughter at patient's nursing facility today.   On admission patient's vitals were stable ordered and hypotension with blood pressure of 169/93 that has improved to 139/71.  On exam patient is currently sedated as she had recently received Ativan due to mental state and continued restlessness.  Cardiac and respiratory exam within normal.  Unable to assess neuro exam given patient's sedated state.  Initial labs showed showed no significant electrolyte abnormality other than mildly hypokalemic with potassium of 3.3. CBC was normal other than Hgb 11.1.  Head CT shows similar findings on prior MRI of small mixed density left subdural fluid this time with subdural empyema.  Patient's acute change in mental status is likely due to encephalopathy secondary to subdural empyema however other possible etiologies cannot be ruled out at this time.  Differentials to take into consideration although less likely include possible delirium or other source of infections.  We will continue to assess for order possible causes. [facility] who was managing patient is subdural empyema and previous admission was consulted who recommend obtaining MRI brain with and without contrast and  starting patient IV antibiotics, and Keppra 500 mg twice daily after 1000 mg loading dose. -Admitted to progressive FPTS, attending Dr. Andria Frames -Neurosurgery following, appreciate recs -N.p.o. diet -Start IV antibiotics Flagyl, CTX, vancomycin. -Follow-up blood culture -Continue routine vitals -Fall precautions -Neurochecks every 4 hours -SCDs for VTE prophylaxis -Follow-up with [facility] neurosurgery team  HTN BP on admission was 151/86.  Patient's home medication include diltiazem 60 mg twice daily.  This -Consider holding BP medication. -Continue routine vitals  PAF  patient is EKG on admission was sinus rhythm.  Patient on home Eliquis 5 mg twice daily, aspirin 162 mg daily and digoxin 0.125. -Start Eliquis 5 mg -Hold digoxin and ASA -Continue cardiac monitoring  Type 2 diabetes Patient's last A1c 2 weeks ago was 6.2.  Home medication includes metformin 500 mg twice daily. -Hold home meds -Initiate SSI -CBG monitoring every 4 hours with meals and at bedtime  Hypokalemia Patient's potassium on admission was 3.3.  Patient received repletion with IV oral K. -Morning lab; BMP -Continue to replete potassium as needed  HLD Patient's last lipid panel 2 weeks ago showed cholesterol 148, triglyceride 49, HDL 52 and LDL 86.  Home medication includes atorvastatin 40 mg daily. -Continue home medication.  Alcohol use Has a history of drinking alcohol daily.  Unable to assess her drinking due to her altered mental status at this time.  FEN/GI: N.p.o. Prophylaxis: SCDs  Disposition: Admit to med telemetry  History of Present Illness:  Chelsea Gallegos is a 88 y.o. female presenting with altered mental status  Patient is currently sedated from prior Ativan given and unable to provide history.  Per report from the ED provider patient was  seen today at nursing facility by daughter who said patient was signed about patient's acute change in patient's mental status with confusion.   Patient was having word salad and unable to articulate appropriately in conversations.  Of note patient was recently admitted to the hospital for sepsis a week ago and during the hospitalization MRI showed patient had subdural empyema prompting transfer to [facility] neurosurgery for continued management.  History is very limited given patient's current state.  We will look to get more information to daughter tomorrow.  Review Of Systems: Per HPI with the following additions:   Review of Systems  Unable to perform ROS: Mental status change    Patient Active Problem List   Diagnosis Date Noted   Sepsis ([place]) 12/20/2021   Weakness 12/20/2021   Healthcare maintenance 12/14/2018   Hypokalemia 10/29/2018   Hypotension 10/21/2018   Atrial fibrillation ([place]) 10/20/2018   GERD (gastroesophageal reflux disease)    Paroxysmal atrial fibrillation (HCC) 01/08/2017   Right ankle pain 01/07/2017   Acute gout of right ankle 01/07/2017   Chest pain 05/20/2015   Hypertension 02/27/2012   Hyperlipidemia 02/27/2012   Diabetes type 2, controlled ([place]) 02/27/2012    Past Medical History: Past Medical History:  Diagnosis Date   Arthritis    "arms" (10/20/2018)   Asthma    Diabetic retinopathy (HCC)    NPDR OU   GERD (gastroesophageal reflux disease)    Glaucoma    POAG OU   History of gout    History of hiatal hernia    History of kidney stones    Hyperlipidemia ~2005   Hypertension    Hypertensive retinopathy    OU   Refusal of blood transfusions as patient is Jehovah's Witness    Stroke [facility]) 04/2017   "dr told me I had a stroke in my left eye" (10/20/2018)   Type II diabetes mellitus ([place])     Past Surgical History: Past Surgical History:  Procedure Laterality Date   CATARACT EXTRACTION Bilateral    [name]   CATARACT EXTRACTION, BILATERAL Bilateral 2018   CHOLECYSTECTOMY     pt does not recall this OR (10/20/2018)   CYSTOSCOPY W/ STONE MANIPULATION     EXCISIONAL  HEMORRHOIDECTOMY  1980s/1990s   "& he cut my muscle that controls my bowels"   EYE SURGERY     FRACTURE SURGERY     GLAUCOMA SURGERY Bilateral 2018   HERNIA REPAIR     KNEE ARTHROSCOPY Left 04/2012    WITH MEDIAL MENISECTOMY/notes 04/16/2012   LEFT HEART CATH AND CORONARY ANGIOGRAPHY N/A 10/21/2018   Procedure: LEFT HEART CATH AND CORONARY ANGIOGRAPHY;  Surgeon: Dixie Dials, MD;  Location: [facility];  Service: Cardiovascular;  Laterality: N/A;   PATELLA FRACTURE SURGERY Right ~ 0867   UMBILICAL HERNIA REPAIR  2000s    Social History: Social History   Tobacco Use   Smoking status: Former    Packs/day: 3.00    Years: 20.00    Pack years: 60.00    Types: Cigarettes    Quit date: 12/09/1972    Years since quitting: 49.1   Smokeless tobacco: Never  Vaping Use   Vaping Use: Never used  Substance Use Topics   Alcohol use: Yes    Alcohol/week: 1.0 standard drink    Types: 1 Standard drinks or equivalent per week   Drug use: Never   Additional social history:   Please also refer to relevant sections of EMR.  Family History: Family History  Problem Relation Age of Onset   Hypertension Mother    Hypertension Father    Breast cancer Daughter    (If not completed, MUST add something in)  Allergies and Medications: Allergies  Allergen Reactions   Caffeine     Other reaction(s): Unknown   Netarsudil Other (See Comments)    Other reaction(s): Other (See Comments) Red with like blood running out of her eyes Red with like blood running out of her eyes    Tafluprost (Pf) Other (See Comments)    Other reaction(s): Other (See Comments) Red with like blood running out of her eyes Red with like blood running out of her eyes    No current facility-administered medications on file prior to encounter.   Current Outpatient Medications on File Prior to Encounter  Medication Sig Dispense Refill   acetaminophen (TYLENOL) 325 MG tablet Take 2 tablets (650 mg total) by mouth  every 6 (six) hours as needed for fever.     apixaban (ELIQUIS) 5 MG TABS tablet Take 1 tablet (5 mg total) by mouth 2 (two) times daily. (Patient not taking: Reported on 12/20/2021) 60 tablet 0   aspirin 81 MG chewable tablet Chew 162 mg by mouth once.     atorvastatin (LIPITOR) 40 MG tablet Take 1 tablet (40 mg total) by mouth daily at 6 PM. (Patient not taking: Reported on 07/09/2019) 30 tablet 1   ceFEPIme 2 g in sodium chloride 0.9 % 100 mL Inject 2 g into the vein every 12 (twelve) hours.     digoxin (LANOXIN) 0.125 MG tablet Take 1 tablet (0.125 mg total) by mouth every Monday, Wednesday, and Friday. (Patient not taking: Reported on 12/20/2021) 15 tablet 3   diltiazem (CARDIZEM) 60 MG tablet Take 1 tablet (60 mg total) by mouth 2 (two) times daily. (Patient not taking: Reported on 12/20/2021) 60 tablet 3   dorzolamide (TRUSOPT) 2 % ophthalmic solution Place 1 drop into both eyes 2 (two) times daily.  12   feeding supplement (ENSURE ENLIVE / ENSURE PLUS) LIQD Take 237 mLs by mouth 2 (two) times daily between meals. 237 mL 12   fluorometholone (FML) 0.1 % ophthalmic suspension Place 1 drop into both eyes 2 (two) times daily. (Patient not taking: Reported on 2/58/5277)     folic acid (FOLVITE) 1 MG tablet Take 1 tablet (1 mg total) by mouth daily.     metFORMIN (GLUCOPHAGE) 500 MG tablet Take 1 tablet (500 mg total) by mouth 2 (two) times daily with a meal. (Patient not taking: Reported on 07/09/2019) 60 tablet 0   Multiple Vitamin (MULTIVITAMIN WITH MINERALS) TABS tablet Take 1 tablet by mouth daily.     thiamine 100 MG tablet Take 1 tablet (100 mg total) by mouth daily.     vancomycin (VANCOREADY) 750 MG/150ML SOLN Inject 150 mLs (750 mg total) into the vein daily.     Vitamin D, Ergocalciferol, (DRISDOL) 1.25 MG (50000 UNIT) CAPS capsule Take 50,000 Units by mouth once a week.      Objective: BP (!) 143/71    Pulse 88    Temp 97.7 F (36.5 C) (Oral)    Resp 19    SpO2 100%  Exam: General:  Sedated, NAD HEENT: Atraumatic, MMM CV: RRR, no murmurs, normal S1/S2 Pulm: CTAB, good WOB on RA, no crackles or wheezing Abd: Soft, no distension, no tenderness Skin: dry, warm Ext: No BLE edema,  Neuro: Unable to assess due to patient sedated s/p Ativan Psych: Unable to assess due  to patient sedated s/p Ativan  Labs and Imaging: CBC BMET  Recent Labs  Lab 01/03/22 2026  WBC 7.8  HGB 11.1*  HCT 34.0*  PLT 389   Recent Labs  Lab 01/03/22 2026  NA 138  K 3.3*  CL 98  CO2 28  BUN <5*  CREATININE 0.78  GLUCOSE 107*  CALCIUM 8.6*     EKG: None  DG Chest 2 View  Result Date: 01/03/2022 CLINICAL DATA:  Confirm PICC placement. EXAM: CHEST - 2 VIEW COMPARISON:  Radiograph 12/20/2021 FINDINGS: Right upper extremity PICC tip in the mid SVC. Mild cardiomegaly, likely accentuated by AP technique. Mild vascular congestion. Minimal fluid in the fissures with possible small subpulmonic effusions. No confluent consolidation. No pneumothorax. No acute osseous abnormalities are seen. IMPRESSION: 1. Right upper extremity PICC tip in the mid SVC. 2. Mild cardiomegaly with vascular congestion, minimal fluid in the fissures and possible small subpulmonic effusions. Electronically Signed   By: Keith Rake M.D.   On: 01/03/2022 21:17   CT Head Wo Contrast  Result Date: 01/03/2022 CLINICAL DATA:  Neurologic deficit. EXAM: CT HEAD WITHOUT CONTRAST TECHNIQUE: Contiguous axial images were obtained from the base of the skull through the vertex without intravenous contrast. RADIATION DOSE REDUCTION: This exam was performed according to the departmental dose-optimization program which includes automated exposure control, adjustment of the mA and/or kV according to patient size and/or use of iterative reconstruction technique. COMPARISON:  Head CT dated 12/20/2021. FINDINGS: Brain: Mild age-related atrophy and chronic microvascular ischemic changes. Small mixed density left subdural fluid measures up to  5 mm in thickness and corresponds to the fluid seen on the prior MRI of 12/21/2021. No acute or new intracranial hemorrhage. No mass effect or midline shift. A 12 mm right parietal convexity calcified meningioma. Vascular: No hyperdense vessel or unexpected calcification. Skull: Normal. Negative for fracture or focal lesion. Sinuses/Orbits: There is complete opacification of the left ethmoid air cells and frontal sinus as well as partial opacification of the left maxillary sinus. The mastoid air cells are clear. Other: None IMPRESSION: 1. No acute intracranial hemorrhage. 2. Small mixed density left subdural fluid, similar to the prior MRI of 12/21/2021. No mass effect or midline shift. 3. Mild age-related atrophy and chronic microvascular ischemic changes. 4. A 12 mm right parietal convexity calcified meningioma. 5. Paranasal sinus disease. Electronically Signed   By: Anner Crete M.D.   On: 01/03/2022 21:39       Alen Bleacher, MD 01/04/2022, 4:29 AM PGY-1, [place] Intern pager: [phone], text pages welcome

## 2022-01-04 NOTE — ED Notes (Signed)
Repositioned pt in bed

## 2022-01-04 NOTE — ED Notes (Signed)
IV meds held, Pt off floor to MRI , will initate upon return to room

## 2022-01-04 NOTE — ED Notes (Signed)
Pt throwing legs over side rails. Safety sitter order placed. Called staffing for sitter, per staffing no sitters available at this time.

## 2022-01-04 NOTE — ED Provider Notes (Signed)
[facility] EMERGENCY DEPARTMENT Provider Note   CSN: [id number] Arrival date & time: 01/03/22  1859     History  Chief Complaint  Patient presents with   Altered Mental Status    Chelsea Gallegos is a 88 y.o. female with a past medical history significant for hypertension, hyperlipidemia, diabetes, paroxysmal A. fib, GERD, with recent relevant history of sepsis, subdural empyema, who was recently discharged after hospitalization, surgery at [facility].  Per her daughter patient was discharge from the hospital, has been recovering in rehab facility, had been back to her neurologic baseline for the last several days, last known well last night, this morning she had complete alteration of her mental status, confusion, inability to express her thoughts.  She is moving all of her limbs spontaneously, has no obvious weakness or droop per her daughter.  Level 5 caveat altered mental status.   Altered Mental Status     Home Medications Prior to Admission medications   Medication Sig Start Date End Date Taking? Authorizing Provider  acetaminophen (TYLENOL) 325 MG tablet Take 2 tablets (650 mg total) by mouth every 6 (six) hours as needed for fever. 12/23/21   Lurline Del, DO  apixaban (ELIQUIS) 5 MG TABS tablet Take 1 tablet (5 mg total) by mouth 2 (two) times daily. Patient not taking: Reported on 12/20/2021 10/22/18   Glenis Smoker, MD  aspirin 81 MG chewable tablet Chew 162 mg by mouth once.    [provider]  atorvastatin (LIPITOR) 40 MG tablet Take 1 tablet (40 mg total) by mouth daily at 6 PM. Patient not taking: Reported on 07/09/2019 10/22/18   Glenis Smoker, MD  ceFEPIme 2 g in sodium chloride 0.9 % 100 mL Inject 2 g into the vein every 12 (twelve) hours. 12/24/21   Lurline Del, DO  digoxin (LANOXIN) 0.125 MG tablet Take 1 tablet (0.125 mg total) by mouth every Monday, Wednesday, and Friday. Patient not taking: Reported on 12/20/2021 10/23/18    Glenis Smoker, MD  diltiazem (CARDIZEM) 60 MG tablet Take 1 tablet (60 mg total) by mouth 2 (two) times daily. Patient not taking: Reported on 12/20/2021 10/22/18   Glenis Smoker, MD  dorzolamide (TRUSOPT) 2 % ophthalmic solution Place 1 drop into both eyes 2 (two) times daily. 09/09/18   [provider]  feeding supplement (ENSURE ENLIVE / ENSURE PLUS) LIQD Take 237 mLs by mouth 2 (two) times daily between meals. 12/24/21   Welborn, Ryan, DO  fluorometholone (FML) 0.1 % ophthalmic suspension Place 1 drop into both eyes 2 (two) times daily. Patient not taking: Reported on 12/20/2021 09/30/19   [provider]  folic acid (FOLVITE) 1 MG tablet Take 1 tablet (1 mg total) by mouth daily. 12/24/21   Lurline Del, DO  metFORMIN (GLUCOPHAGE) 500 MG tablet Take 1 tablet (500 mg total) by mouth 2 (two) times daily with a meal. Patient not taking: Reported on 07/09/2019 10/22/18   Glenis Smoker, MD  Multiple Vitamin (MULTIVITAMIN WITH MINERALS) TABS tablet Take 1 tablet by mouth daily. 12/24/21   Lurline Del, DO  thiamine 100 MG tablet Take 1 tablet (100 mg total) by mouth daily. 12/24/21   Welborn, Ryan, DO  vancomycin (VANCOREADY) 750 MG/150ML SOLN Inject 150 mLs (750 mg total) into the vein daily. 12/24/21   Lurline Del, DO  Vitamin D, Ergocalciferol, (DRISDOL) 1.25 MG (50000 UNIT) CAPS capsule Take 50,000 Units by mouth once a week. 12/12/21   [provider]  Allergies    Caffeine, Netarsudil, and Tafluprost (pf)    Review of Systems   Review of Systems  Reason unable to perform ROS: Altered mental status.   Physical Exam Updated Vital Signs BP (!) 127/96    Pulse 72    Temp 97.7 F (36.5 C) (Oral)    Resp 18    SpO2 94%  Physical Exam Vitals and nursing note reviewed.  Constitutional:      General: She is not in acute distress.    Appearance: Normal appearance.  HENT:     Head: Normocephalic and atraumatic.  Eyes:     General:         Right eye: No discharge.        Left eye: No discharge.  Cardiovascular:     Rate and Rhythm: Normal rate and regular rhythm.     Heart sounds: No murmur heard.   No friction rub. No gallop.  Pulmonary:     Effort: Pulmonary effort is normal.     Breath sounds: Normal breath sounds.  Abdominal:     General: Bowel sounds are normal.     Palpations: Abdomen is soft.  Skin:    General: Skin is warm and dry.     Capillary Refill: Capillary refill takes less than 2 seconds.  Neurological:     Mental Status: She is alert.     Comments: Patient is not alert and oriented to herself, place, time.  It appears that she may have an expressive aphasia, she cannot answer questions other than to say "no" and other nonsense.  She does appear to be getting frustrated with inability to communicate herself.  She can move all of her limbs spontaneously.  She has no obvious facial droop, she can move limbs and recoil from pain.  Her eyes are responsive to light, however she does not move in response to nearby stimuli.  Psychiatric:        Mood and Affect: Mood normal.        Behavior: Behavior normal.    ED Results / Procedures / Treatments   Labs (all labs ordered are listed, but only abnormal results are displayed) Labs Reviewed  CBC WITH DIFFERENTIAL/PLATELET - Abnormal; Notable for the following components:      Result Value   RBC 3.60 (*)    Hemoglobin 11.1 (*)    HCT 34.0 (*)    All other components within normal limits  COMPREHENSIVE METABOLIC PANEL - Abnormal; Notable for the following components:   Potassium 3.3 (*)    Glucose, Bld 107 (*)    BUN <5 (*)    Calcium 8.6 (*)    Albumin 2.8 (*)    AST 80 (*)    ALT 73 (*)    All other components within normal limits  RESP PANEL BY RT-PCR (FLU A&B, COVID) ARPGX2  CULTURE, BLOOD (ROUTINE X 2)  CULTURE, BLOOD (ROUTINE X 2)  LACTIC ACID, PLASMA  URINALYSIS, ROUTINE W REFLEX MICROSCOPIC    EKG None  Radiology DG Chest 2 View  Result  Date: 01/03/2022 CLINICAL DATA:  Confirm PICC placement. EXAM: CHEST - 2 VIEW COMPARISON:  Radiograph 12/20/2021 FINDINGS: Right upper extremity PICC tip in the mid SVC. Mild cardiomegaly, likely accentuated by AP technique. Mild vascular congestion. Minimal fluid in the fissures with possible small subpulmonic effusions. No confluent consolidation. No pneumothorax. No acute osseous abnormalities are seen. IMPRESSION: 1. Right upper extremity PICC tip in the mid SVC. 2. Mild cardiomegaly with vascular  congestion, minimal fluid in the fissures and possible small subpulmonic effusions. Electronically Signed   By: Keith Rake M.D.   On: 01/03/2022 21:17   CT Head Wo Contrast  Result Date: 01/03/2022 CLINICAL DATA:  Neurologic deficit. EXAM: CT HEAD WITHOUT CONTRAST TECHNIQUE: Contiguous axial images were obtained from the base of the skull through the vertex without intravenous contrast. RADIATION DOSE REDUCTION: This exam was performed according to the departmental dose-optimization program which includes automated exposure control, adjustment of the mA and/or kV according to patient size and/or use of iterative reconstruction technique. COMPARISON:  Head CT dated 12/20/2021. FINDINGS: Brain: Mild age-related atrophy and chronic microvascular ischemic changes. Small mixed density left subdural fluid measures up to 5 mm in thickness and corresponds to the fluid seen on the prior MRI of 12/21/2021. No acute or new intracranial hemorrhage. No mass effect or midline shift. A 12 mm right parietal convexity calcified meningioma. Vascular: No hyperdense vessel or unexpected calcification. Skull: Normal. Negative for fracture or focal lesion. Sinuses/Orbits: There is complete opacification of the left ethmoid air cells and frontal sinus as well as partial opacification of the left maxillary sinus. The mastoid air cells are clear. Other: None IMPRESSION: 1. No acute intracranial hemorrhage. 2. Small mixed density left  subdural fluid, similar to the prior MRI of 12/21/2021. No mass effect or midline shift. 3. Mild age-related atrophy and chronic microvascular ischemic changes. 4. A 12 mm right parietal convexity calcified meningioma. 5. Paranasal sinus disease. Electronically Signed   By: Anner Crete M.D.   On: 01/03/2022 21:39    Procedures Procedures    Medications Ordered in ED Medications  cefTRIAXone (ROCEPHIN) 2 g in sodium chloride 0.9 % 100 mL IVPB (0 g Intravenous Stopped 01/03/22 2249)    ED Course/ Medical Decision Making/ A&P                           Medical Decision Making Amount and/or Complexity of Data Reviewed Labs: ordered. Radiology: ordered.   I discussed this case with my attending physician who cosigned this note including patient's presenting symptoms, physical exam, and planned diagnostics and interventions. Attending physician stated agreement with plan or made changes to plan which were implemented.   Attending physician assessed patient at bedside.  This patient presents to the ED for concern of altered mental status in context of recent subdural empyema, recent hospitalization at [facility], and ENT surgery, For exploration of her sinus passages, evaluate for infection, and clear out any infectious etiology.  She was not seen primarily by neurosurgery . this involves an extensive number of treatment options, and is a complaint that carries with it a high risk of complications and morbidity. The emergent differential diagnosis includes, but is not limited to,   Co morbidities that complicate the patient evaluation: Acute stroke, worsening empyema, sepsis.   Additional history obtained from patient's daughter. External records from outside source obtained and reviewed including recent hospitalization, operative notes.  Physical Exam: Physical exam performed. The pertinent findings include: Patient with apparent expressive aphasia, however she is moving her limbs spontaneously,  she does not appear septic or febrile at this time.  No significant reaction to palpation of abdomen.  Normal heart and lung sounds.  Her PICC line is in place appropriately.  Lab Tests: I Ordered, and personally interpreted labs.  The pertinent results include: Mild hypokalemia, potassium 3.3.  Mild hyperglycemia, glucose 107.  Slightly increased liver transaminases, unclear clinical significance.  Normal lactic acid, negative RVP.  Her CBC has no white count, very mild anemia hemoglobin 11.1.  Blood cultures are in process.   Imaging Studies: I ordered imaging studies including chest x-ray, head CT wo contrast. I independently visualized and interpreted imaging which showed some cardiomegaly, pulmonary congestion, but no other acute intrathoracic abnormality, she does have PICC line in appropriately.  CT head without contrast shows similar size of subdural empyema collection, as well as calcified meningioma, sinus disease, and age-related microvascular changes. I agree with the radiologist interpretation.  Is patient was just discharged and seen primarily at [facility] for evaluation of this complicated problem we are in process of consulting with the ENT team, and the neurology team to evaluate the status of this patient.  Spoke with on-call ENT who reports they were not the primary on this case, and we should page neurology.  Page placed to neurology at this time.  12:21 AM Care of Chelsea Gallegos transferred to [facility] and Dr. Ralene Bathe at the end of my shift as the patient will require reassessment once labs/imaging have resulted. Patient presentation, ED course, and plan of care discussed with review of all pertinent labs and imaging. Please see his/her note for further details regarding further ED course and disposition. Plan at time of handoff is pending consult with on-call neurology at [facility] for probable transfer for further evaluation. This may be altered or completely changed at the discretion  of the oncoming team pending results of further workup.   Final Clinical Impression(s) / ED Diagnoses Final diagnoses:  None    Rx / DC Orders ED Discharge Orders     None         Dorien Chihuahua 01/04/22 0022    Wyvonnia Dusky, MD 01/04/22 1023

## 2022-01-04 NOTE — ED Notes (Signed)
Called pt's daughter Loletha Carrow to update her on pt status. Pt's daughter notified of restraint application and necessity for restraints to maintain pt safety. Pt daughter verbalized understanding and is on her way to visit pt.

## 2022-01-04 NOTE — ED Notes (Signed)
Pt noted to have leg and arm over side rail. Posey alarm went off and staff able to intervene. Paging admitting to obtain restraint order for pt safety.

## 2022-01-04 NOTE — ED Notes (Signed)
Provider at bedside to assess pt

## 2022-01-04 NOTE — ED Notes (Signed)
[facility] called 15:31 per RN request for transfer to [facility], [address] Room 8B20, accepting Dr Azalia Bilis

## 2022-01-04 NOTE — ED Notes (Signed)
Pt repositioned in bed d/t pt restless in bed and set off bed alarm

## 2022-01-04 NOTE — ED Notes (Signed)
Daughter voiced concern regarding pt not being able to eat at this time. Explained to pt's daughter that d/t pt's AMS, inability to follow commands and failing her swallow eval, it is not safe to give pt anything by mouth at this time d/t the potential for aspiration which could lead to aspiration pneumonia. Pt's daughter verbalized understanding.

## 2022-01-04 NOTE — ED Notes (Signed)
Pt to be transported to [facility] via [facility] w/ NS infusing for K infusions. 3rd K infusion infusing at time of departure. 4th K infusion transported w/ pt for transport. Vancomycin also infusing at time of transport.

## 2022-01-04 NOTE — ED Notes (Signed)
Pt still restless in bed. Found pt legs through side rails of stretcher. Applied blankets to side rails of stretcher to maintain pt safety

## 2022-01-04 NOTE — ED Provider Notes (Signed)
Clinical Course as of 01/04/22 0358  Fri Jan 04, 2022  0030 Per Carlus Pavlov, [facility] called back transfer line. Reports that ENT was not primary on admission and to consult with neurosurgery regarding transfer. [DP]  [phone] Call back placed to [facility] to inquire about neurosurgery consult. [facility] transfer line reports they have paged the Neurosurgical service x 4 without response; will page a 5th time. Will reach out via phone when they have a consultant on the line to speak with. Family updated via phone by Network engineer. [PN]  3614 Called back to [facility] transfer line; report no calls back from Neurosurgery and they continue to page every hour. Will attempt to discuss case with ED provider at [facility] to see if ED to ED transfer is a possibility. [KH]  4315 Spoke with Dr. Alric Ran of [facility]. Recommends continuation of IV abx as well as MRI brain w/w/o contrast. Would advise patient be started on Keppra 500mg  BID following 1000mg  loading dose. No beds presently available at [facility] with waiting list for various services. Dr. Alric Ran does not feel that patient requires emergent transfer for admission, though he will discuss patient case with Dr. Brett Albino in the morning and reach back out to discuss potential transfer at a later point. [QM]  0867 Standing orders placed to continue abx based on ID notes from [facility]; Vancomycin 1750mg  IV q 24 hours, Rocephin 2g IV q 12 hours, Flagyl 500mg  PO q 8 hours. [KH]  [phone] Case discussed with family practice service who will assess the patient in the ED for admission pending potential, eventual, transfer back to [facility] when and if a bed becomes available. [KD]    Clinical Course User Index [KH] Ernst Breach, [place] 01/04/22 0359    Quintella Reichert, MD 01/04/22 [phone]

## 2022-01-04 NOTE — Progress Notes (Signed)
Pharmacy Antibiotic Note  Chelsea Gallegos is a 88 y.o. female admitted on 01/03/2022 with a subdural empyema. At [facility], the patient had a transsphenoidal drainage by ENT and was d/ced to SNF on vancomycin, ceftriaxone and  flagyl. Daughter found the patient at SNF significantly confused and brought her to the hospital. Flagyl and Ceftriaxone per MD. Pharmacy has been consulted for Vancomycin dosing.  Per [facility] admission, Vancomycin 750 mg  q24h on admission (receiving at OSH), trough 6.9 on 1/17, increased to 1750 mg q24h, trough 11.4 on 1/20, ordered to continue as outpatient with [facility] ID clinic.   Rph called [facility] 1/27 AM, RN confirmed pt receiving 1750mg  Q24h.  1/27 Vanc trough/random 15, will continue at 1750mg  IV q24h.   Plan: Vancomycin 1750 IV every 24 hours.  Goal trough 15-20 mcg/mL.  Repeat trough in 2-3 days or for concern of renal dysfunction.  Height: 5\' 2"  (157.5 cm) Weight: 81.2 kg (179 lb 0.2 oz) IBW/kg (Calculated) : 50.1  Temp (24hrs), Avg:98 F (36.7 C), Min:97.7 F (36.5 C), Max:98.2 F (36.8 C)  Recent Labs  Lab 01/03/22 2026 01/04/22 0745  WBC 7.8  --   CREATININE 0.78 0.66  LATICACIDVEN 1.2  --   VANCOTROUGH  --  15    Estimated Creatinine Clearance: 50.7 mL/min (by C-G formula based on SCr of 0.66 mg/dL).    Allergies  Allergen Reactions   Caffeine     Other reaction(s): Unknown   Netarsudil Other (See Comments)    Other reaction(s): Other (See Comments) Red with like blood running out of her eyes Red with like blood running out of her eyes    Tafluprost (Pf) Other (See Comments)    Other reaction(s): Other (See Comments) Red with like blood running out of her eyes Red with like blood running out of her eyes     Antimicrobials this admission: 1/26 Ceftriaxone >> 1/27 Flagyl >>  1/27 Vanc >>  Microbiology results: 1/26 BCx: pending  Thank you for allowing pharmacy to be a part of this patients care.  Debbora Presto PharmD  Candidate 01/04/2022 1:26 PM

## 2022-01-04 NOTE — ED Notes (Signed)
Posey alarm placed under pt for pt safety and activated.

## 2022-01-04 NOTE — ED Notes (Signed)
Daughter, Lynetta Mare, requests update on patient/mother. [phone]

## 2022-01-04 NOTE — ED Notes (Signed)
Notified admitting provider that pt's daughter requesting MRI results and wants to know if anyone has contacted [facility] again.

## 2022-01-04 NOTE — Evaluation (Signed)
Clinical/Bedside Swallow Evaluation Patient Details  Name: Chelsea Gallegos MRN: [id number] Date of Birth: 04/26/1934  Today's Date: 01/04/2022 Time: SLP Start Time (ACUTE ONLY): 1440 SLP Stop Time (ACUTE ONLY): 1500 SLP Time Calculation (min) (ACUTE ONLY): 20 min  Past Medical History:  Past Medical History:  Diagnosis Date   Arthritis    "arms" (10/20/2018)   Asthma    Diabetic retinopathy (HCC)    NPDR OU   GERD (gastroesophageal reflux disease)    Glaucoma    POAG OU   History of gout    History of hiatal hernia    History of kidney stones    Hyperlipidemia ~2005   Hypertension    Hypertensive retinopathy    OU   Refusal of blood transfusions as patient is Jehovah's Witness    Stroke [facility]) 04/2017   "dr told me I had a stroke in my left eye" (10/20/2018)   Type II diabetes mellitus ([place])    Past Surgical History:  Past Surgical History:  Procedure Laterality Date   CATARACT EXTRACTION Bilateral    [name]   CATARACT EXTRACTION, BILATERAL Bilateral 2018   CHOLECYSTECTOMY     pt does not recall this OR (10/20/2018)   CYSTOSCOPY W/ STONE MANIPULATION     EXCISIONAL HEMORRHOIDECTOMY  1980s/1990s   "& he cut my muscle that controls my bowels"   EYE SURGERY     FRACTURE SURGERY     GLAUCOMA SURGERY Bilateral 2018   HERNIA REPAIR     KNEE ARTHROSCOPY Left 04/2012    WITH MEDIAL MENISECTOMY/notes 04/16/2012   LEFT HEART CATH AND CORONARY ANGIOGRAPHY N/A 10/21/2018   Procedure: LEFT HEART CATH AND CORONARY ANGIOGRAPHY;  Surgeon: Dixie Dials, MD;  Location: [facility] CV [facility];  Service: Cardiovascular;  Laterality: N/A;   PATELLA FRACTURE SURGERY Right ~ 4098   UMBILICAL HERNIA REPAIR  2000s   HPI:  88 y.o. female admitted from SNF with AMS.  She has a past medical history significant for hypertension, hyperlipidemia, diabetes, paroxysmal A. fib, GERD, with recent relevant history of sepsis, subdural empyema, who was recently discharged after hospitalization,  surgery at [facility] (transsphenoidal drainage by ENT). Empyema still present per recent CT - pt is for possible transfer back to [facility].  She failed an RN swallow screen, so SLP swallow evaluation was ordered 1/27.    Assessment / Plan / Recommendation  Clinical Impression  Pt quite lethargic, restless; maintained eyes closed. Appeared to have right lower facial asymmetry.  Did not follow commands; repeated the word "okay" over and over.  She did accept ice chips and then sips of water; she was able to draw water from a straw reliably with no s/s of aspiration.  Purees were held orally with no effort to manipulate or swallow, so  they were manually removed from her mouth.  Despite AMS, Ms. Round did well protecting her airway with liquids.  Recommend starting thin liquids (the order reads "clears," but she can have any liquids).  SLP will follow for improvements/readiness for PO advancement. D/W RN. Sign posted HOB.   Addendum: Per notes, it appears pt is transferring to [facility]. Recommend SLP f/u there.   SLP Visit Diagnosis: Dysphagia, unspecified (R13.10)    Aspiration Risk       Diet Recommendation     Medication Administration: Crushed with puree    Other  Recommendations Oral Care Recommendations: Oral care QID    Recommendations for follow up therapy are one component of a multi-disciplinary discharge planning  process, led by the attending physician.  Recommendations may be updated based on patient status, additional functional criteria and insurance authorization.  Follow up Recommendations Other (comment) (tba)      Assistance Recommended at Discharge None  Functional Status Assessment Patient has had a recent decline in their functional status and demonstrates the ability to make significant improvements in function in a reasonable and predictable amount of time.  Frequency and Duration min 2x/week  1 week       Prognosis Prognosis for Safe Diet Advancement: Good       Swallow Study   General Date of Onset: 01/03/22 HPI: 88 y.o. female admitted from SNF with AMS.  She has a past medical history significant for hypertension, hyperlipidemia, diabetes, paroxysmal A. fib, GERD, with recent relevant history of sepsis, subdural empyema, who was recently discharged after hospitalization, surgery at [facility] (transsphenoidal drainage by ENT). Empyema still present per recent CT - pt is for possible transfer back to [facility].  She failed an RN swallow screen, so SLP swallow evaluation was ordered 1/27. Type of Study: Bedside Swallow Evaluation Previous Swallow Assessment: not in [place] emr Diet Prior to this Study: NPO Temperature Spikes Noted: No Respiratory Status: Room air History of Recent Intubation: No Behavior/Cognition: Lethargic/Drowsy;Doesn't follow directions Oral Care Completed by SLP: No Self-Feeding Abilities: Total assist Patient Positioning: Upright in bed Baseline Vocal Quality: Normal Volitional Cough: Cognitively unable to elicit Volitional Swallow: Unable to elicit    Oral/Motor/Sensory Function Overall Oral Motor/Sensory Function: Other (comment) (right lower facial weakness)   Ice Chips Ice chips: Within functional limits   Thin Liquid Thin Liquid: Within functional limits    Nectar Thick Nectar Thick Liquid: Not tested   Honey Thick Honey Thick Liquid: Not tested   Puree Puree: Impaired Presentation: Spoon Oral Phase Impairments: Poor awareness of bolus Oral Phase Functional Implications: Oral holding   Solid     Solid: Not tested      Juan Quam Laurice 01/04/2022,3:13 PM  Estill Bamberg L. Tivis Ringer, [place] Office number [phone] Pager [phone]

## 2022-01-04 NOTE — ED Notes (Signed)
Daughter Chelsea Gallegos notified that Pt is going to be admitted here due to lack of bed availability at [facility].

## 2022-01-08 LAB — CULTURE, BLOOD (ROUTINE X 2)
Culture: NO GROWTH
Culture: NO GROWTH
Special Requests: ADEQUATE

## 2022-03-07 DIAGNOSIS — R531 Weakness: Secondary | ICD-10-CM | POA: Diagnosis not present

## 2022-03-07 DIAGNOSIS — G062 Extradural and subdural abscess, unspecified: Secondary | ICD-10-CM | POA: Diagnosis not present

## 2022-03-07 DIAGNOSIS — M6250 Muscle wasting and atrophy, not elsewhere classified, unspecified site: Secondary | ICD-10-CM | POA: Diagnosis not present

## 2022-03-07 DIAGNOSIS — R41841 Cognitive communication deficit: Secondary | ICD-10-CM | POA: Diagnosis not present

## 2022-03-07 DIAGNOSIS — E785 Hyperlipidemia, unspecified: Secondary | ICD-10-CM | POA: Diagnosis not present

## 2022-03-07 DIAGNOSIS — I10 Essential (primary) hypertension: Secondary | ICD-10-CM | POA: Diagnosis not present

## 2022-03-07 DIAGNOSIS — R4182 Altered mental status, unspecified: Secondary | ICD-10-CM | POA: Diagnosis not present

## 2022-03-07 DIAGNOSIS — R1312 Dysphagia, oropharyngeal phase: Secondary | ICD-10-CM | POA: Diagnosis not present

## 2022-03-07 DIAGNOSIS — I480 Paroxysmal atrial fibrillation: Secondary | ICD-10-CM | POA: Diagnosis not present

## 2022-03-07 DIAGNOSIS — K219 Gastro-esophageal reflux disease without esophagitis: Secondary | ICD-10-CM | POA: Diagnosis not present

## 2022-03-07 DIAGNOSIS — M6281 Muscle weakness (generalized): Secondary | ICD-10-CM | POA: Diagnosis not present

## 2022-03-07 DIAGNOSIS — R278 Other lack of coordination: Secondary | ICD-10-CM | POA: Diagnosis not present

## 2022-04-24 IMAGING — CR DG FOOT 2V*L*
2 series · 2 of 2 positions shown · non-contrast
Comparison: None.

CLINICAL DATA: Possible fall.

EXAM:
LEFT FOOT - 2 VIEW

[foot ap]
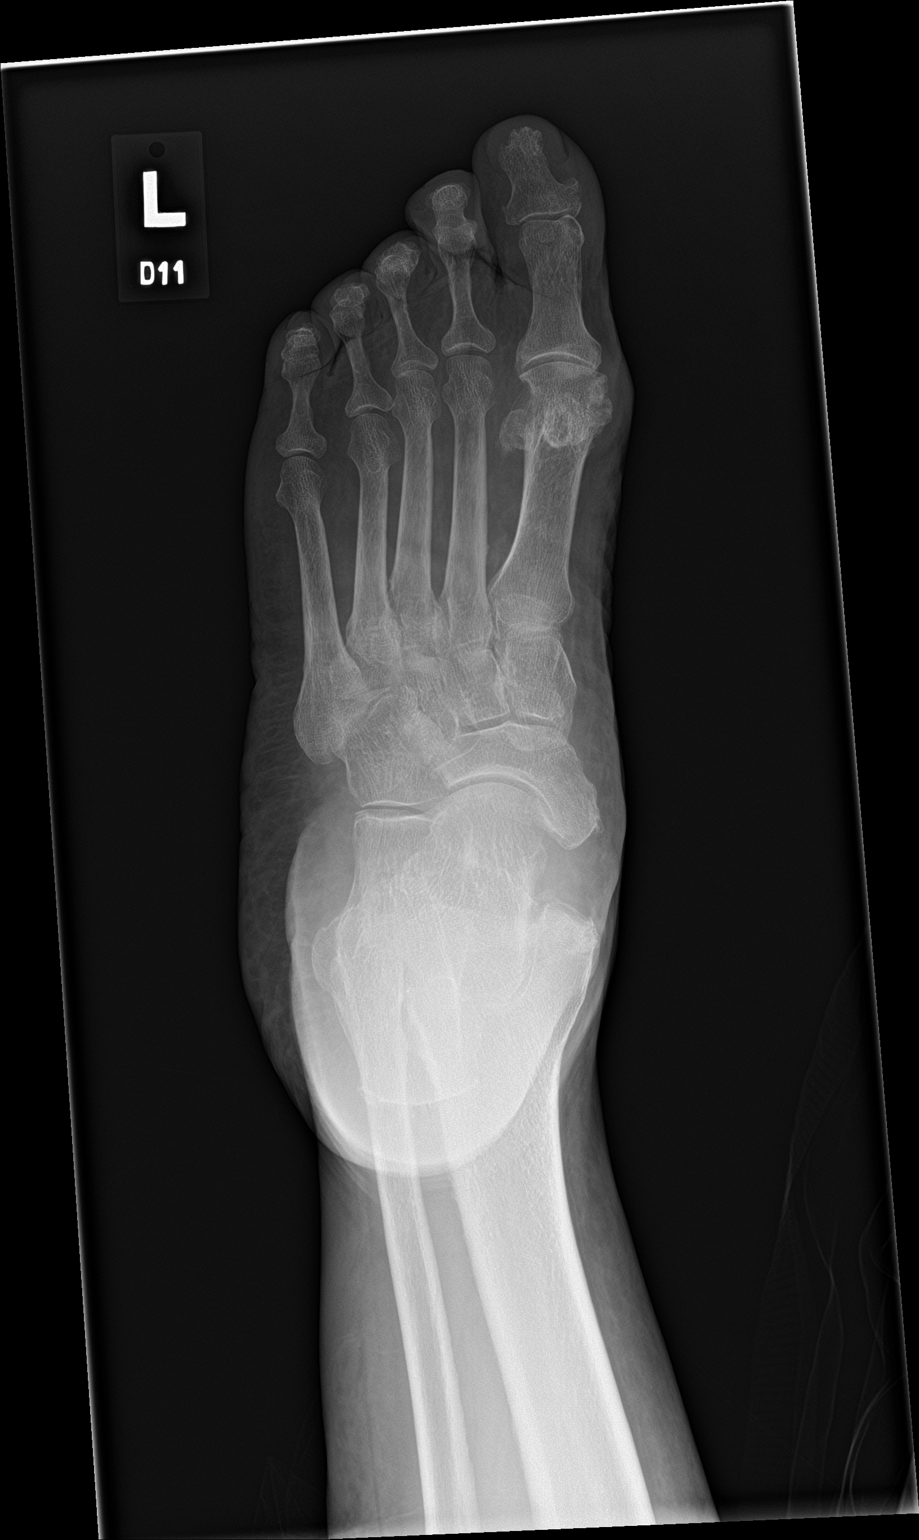

[foot lat]
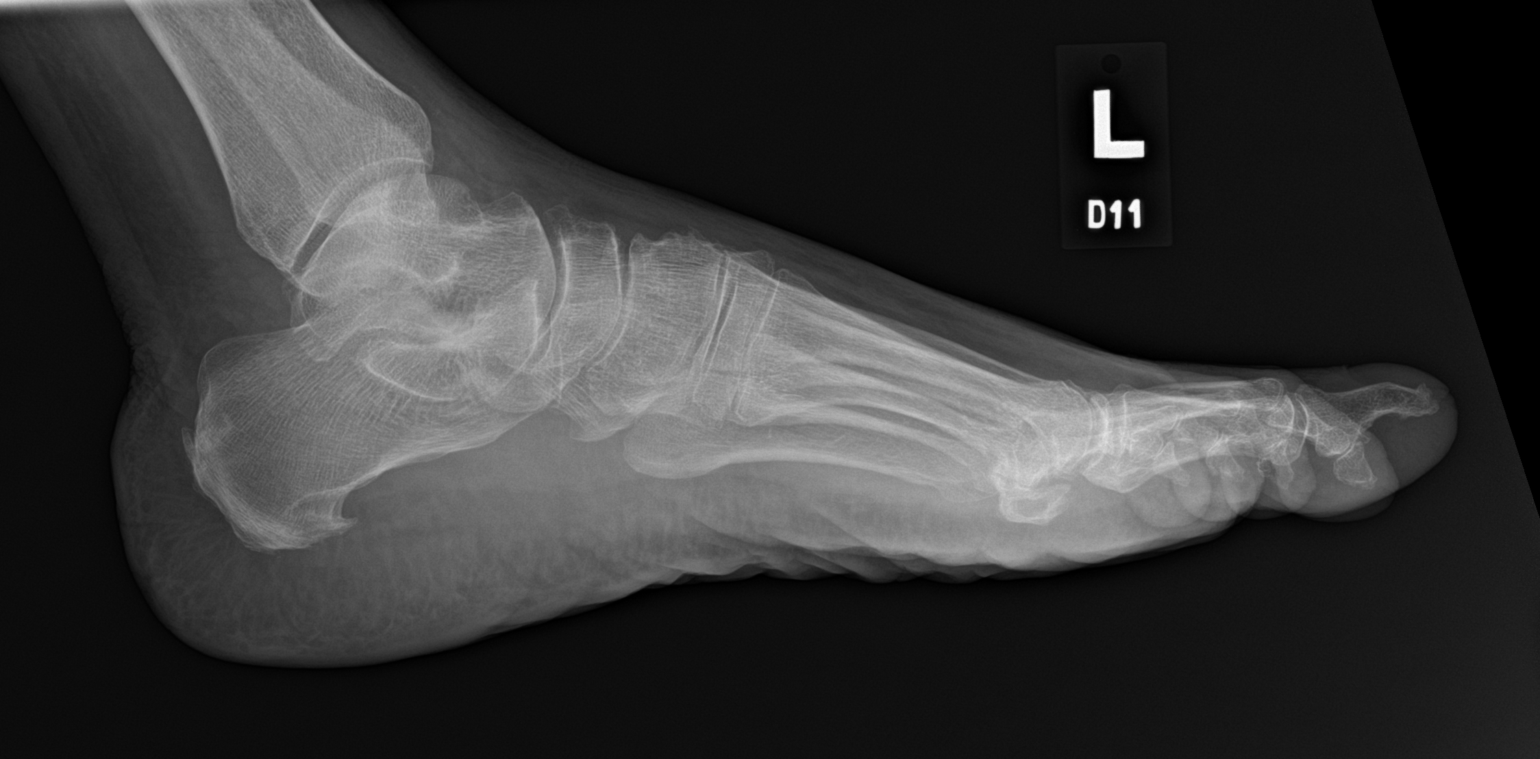

[2 of 2 positions shown; findings below may reference images not displayed]

FINDINGS: No acute bony abnormality. Specifically, no fracture, subluxation,
or dislocation. Plantar calcaneal spur. Mild degenerative changes at
the 1st MTP joint.
IMPRESSION: No acute bony abnormality.

## 2022-04-24 IMAGING — CR DG HIP (WITH OR WITHOUT PELVIS) 2-3V*L*
3 series · 3 of 3 positions shown · non-contrast
Comparison: None.

CLINICAL DATA: Fall.

EXAM:
DG HIP (WITH OR WITHOUT PELVIS) 2-3V LEFT

[pelvis ap]
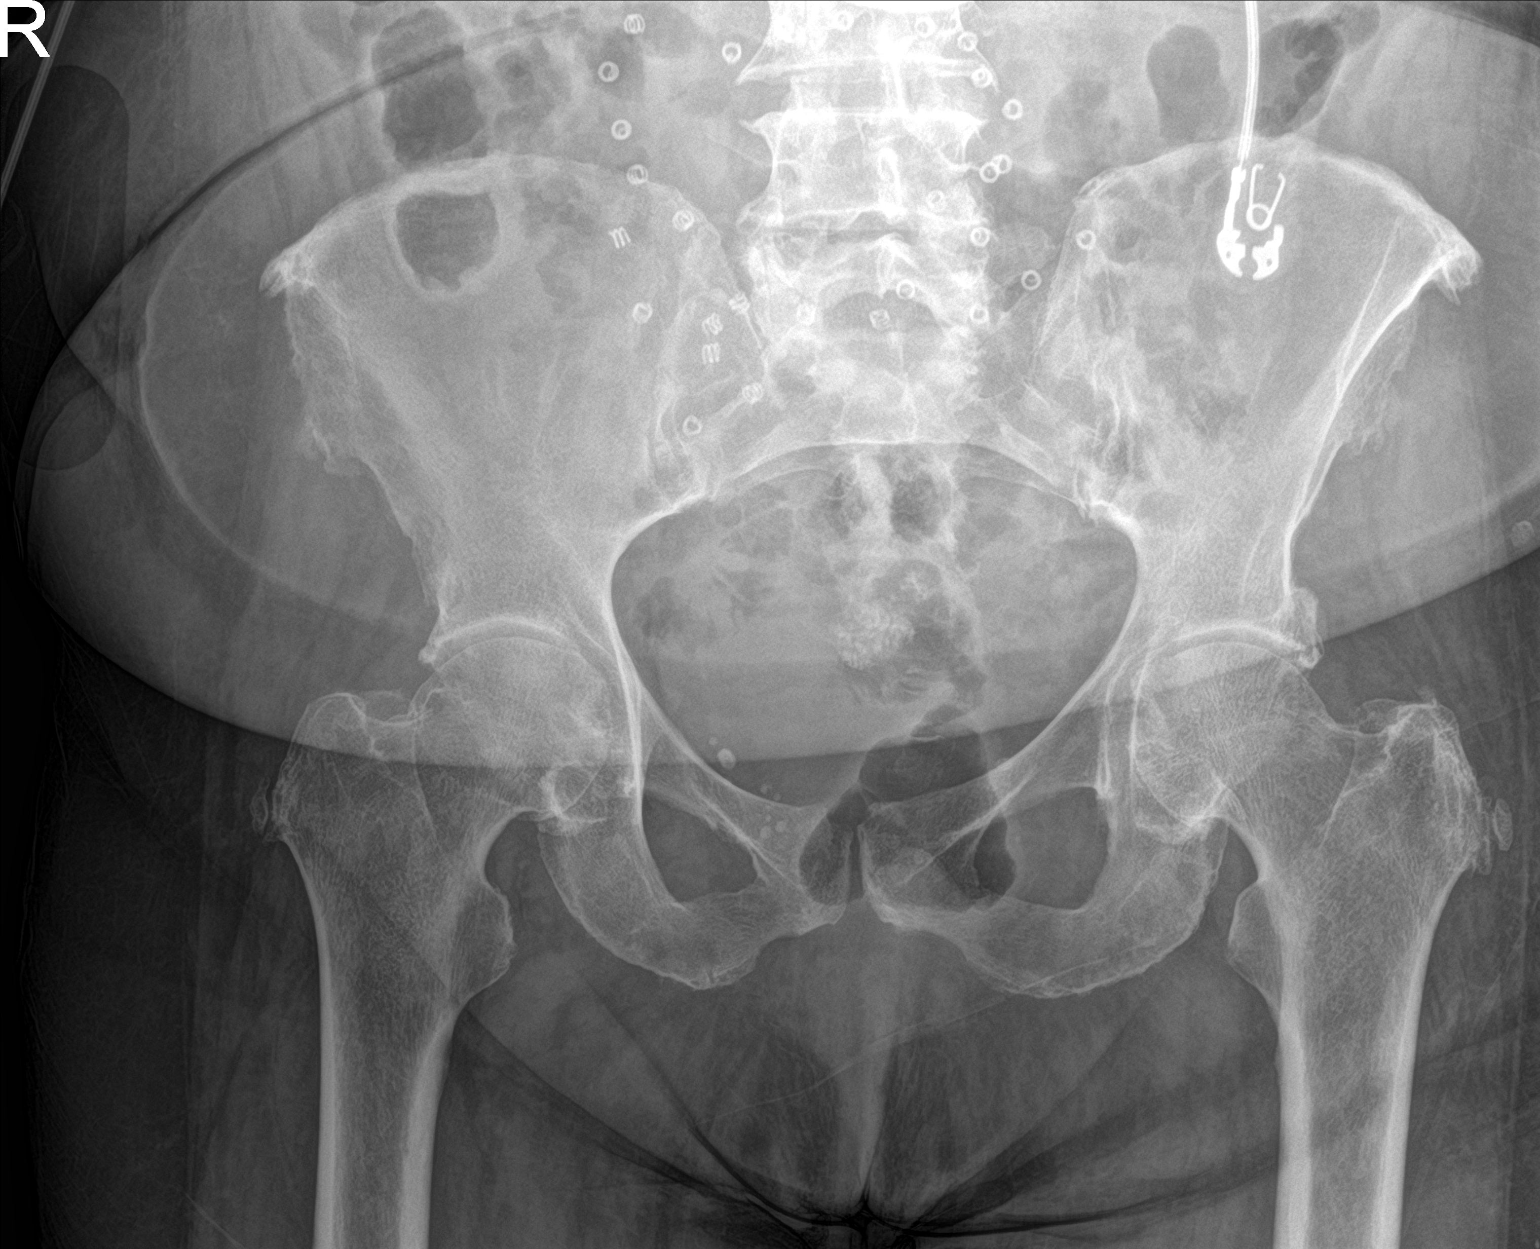

[hip ap]
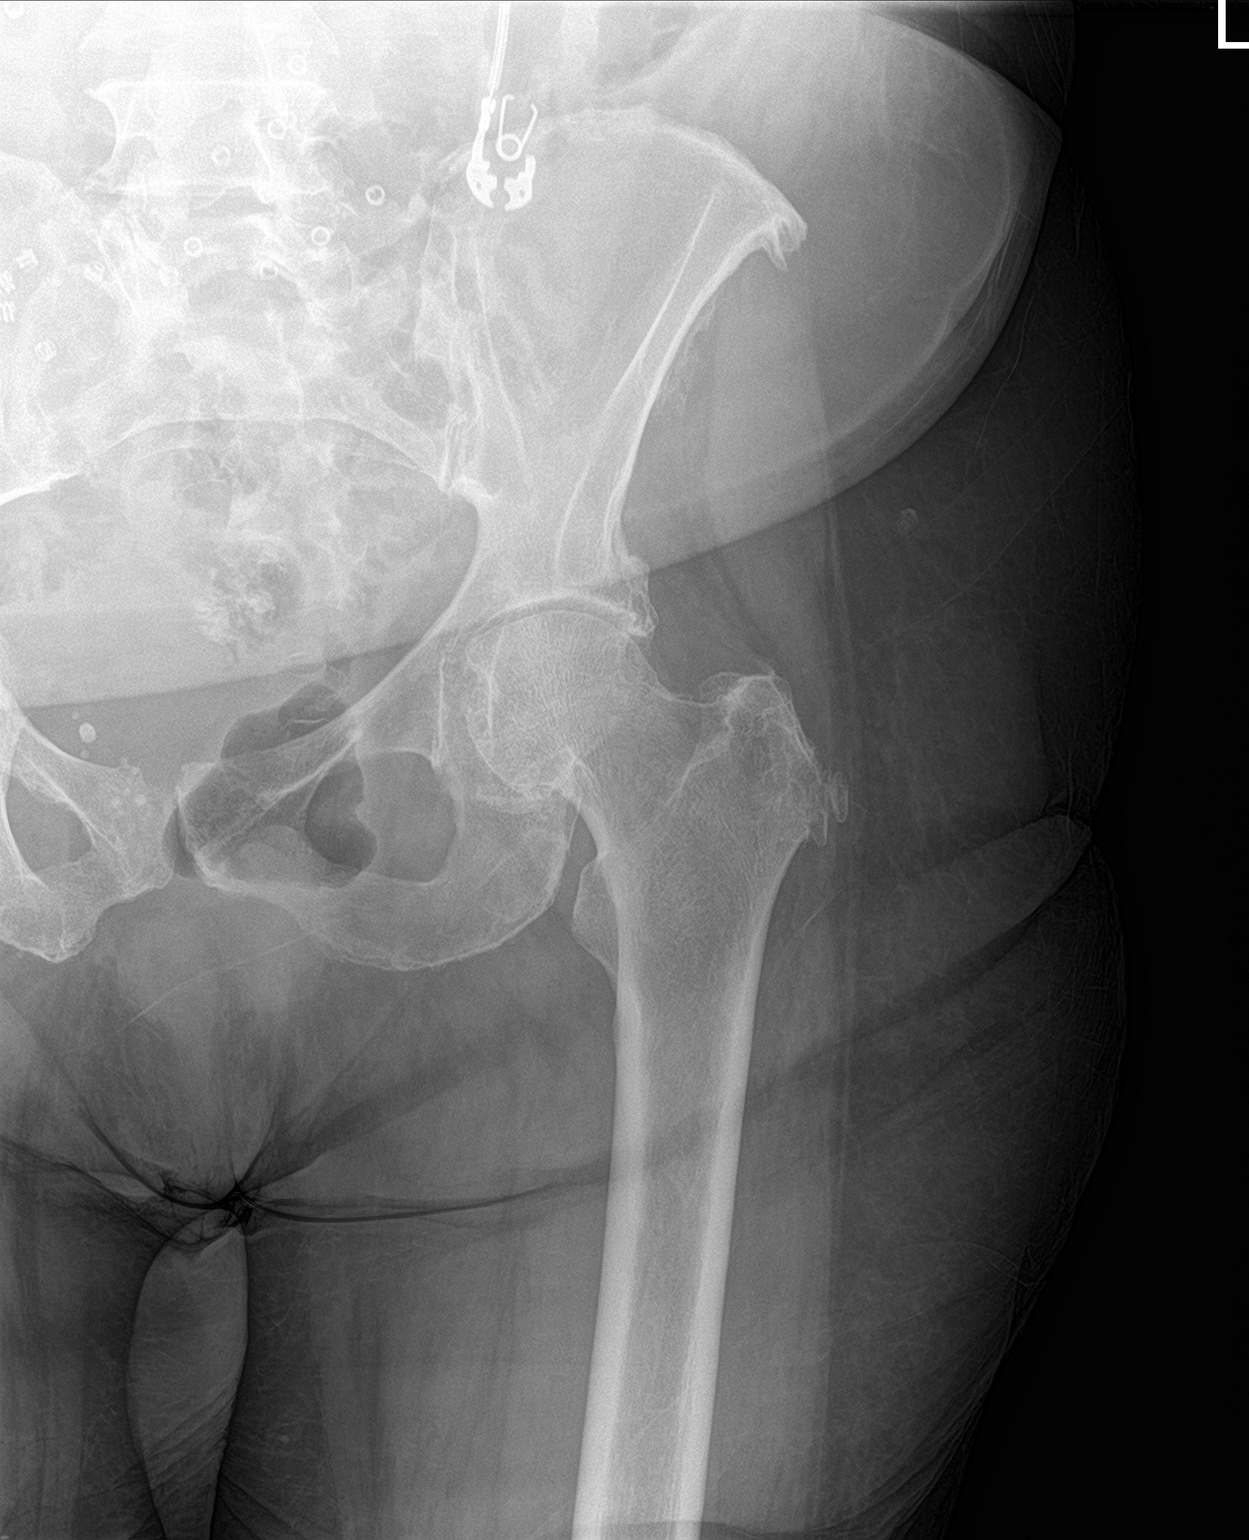

[hip lat]
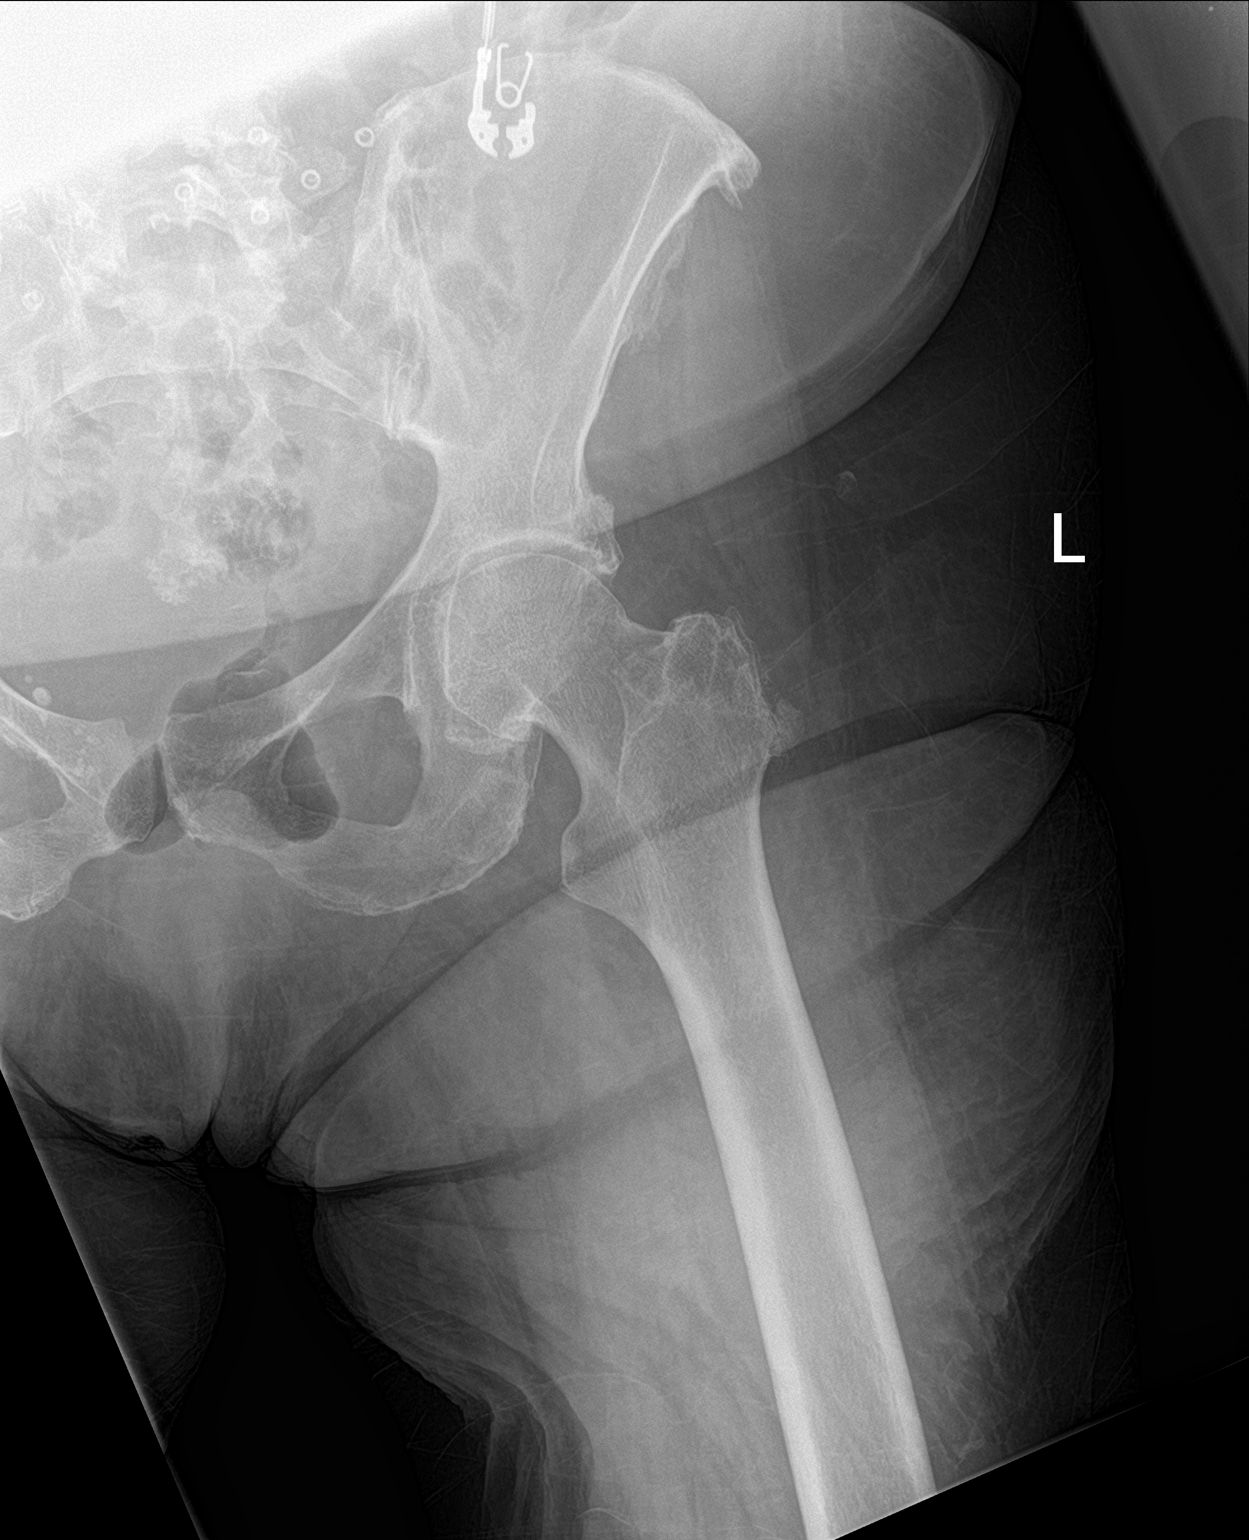

[3 of 3 positions shown; findings below may reference images not displayed]

FINDINGS: There is no evidence of hip fracture or dislocation. Mild osteophyte
formation is seen involving the left hip.
IMPRESSION: Mild degenerative joint disease of the left hip. No acute
abnormality is noted.
